# Patient Record
Sex: Male | Born: 1965 | Race: White | Hispanic: No | Smoking: Current every day smoker
Health system: Southern US, Community
[De-identification: ages and names within clinical notes are randomized; demographics above are authoritative.]

## PROBLEM LIST (undated history)

## (undated) DIAGNOSIS — F319 Bipolar disorder, unspecified: Secondary | ICD-10-CM

## (undated) DIAGNOSIS — T7840XA Allergy, unspecified, initial encounter: Secondary | ICD-10-CM

## (undated) DIAGNOSIS — F419 Anxiety disorder, unspecified: Secondary | ICD-10-CM

## (undated) DIAGNOSIS — F329 Major depressive disorder, single episode, unspecified: Secondary | ICD-10-CM

## (undated) DIAGNOSIS — J45909 Unspecified asthma, uncomplicated: Secondary | ICD-10-CM

## (undated) DIAGNOSIS — F32A Depression, unspecified: Secondary | ICD-10-CM

## (undated) DIAGNOSIS — K219 Gastro-esophageal reflux disease without esophagitis: Secondary | ICD-10-CM

## (undated) HISTORY — PX: SPINE SURGERY: SHX786

## (undated) HISTORY — DX: Anxiety disorder, unspecified: F41.9

## (undated) HISTORY — DX: Gastro-esophageal reflux disease without esophagitis: K21.9

## (undated) HISTORY — DX: Major depressive disorder, single episode, unspecified: F32.9

## (undated) HISTORY — PX: GALLBLADDER SURGERY: SHX652

## (undated) HISTORY — DX: Allergy, unspecified, initial encounter: T78.40XA

## (undated) HISTORY — PX: CHOLECYSTECTOMY: SHX55

## (undated) HISTORY — DX: Depression, unspecified: F32.A

## (undated) HISTORY — PX: FRACTURE SURGERY: SHX138

## (undated) HISTORY — DX: Bipolar disorder, unspecified: F31.9

## (undated) HISTORY — DX: Unspecified asthma, uncomplicated: J45.909

---

## 1995-06-17 HISTORY — PX: CYST REMOVAL HAND: SHX6279

## 1999-03-14 ENCOUNTER — Emergency Department (HOSPITAL_COMMUNITY): Admission: EM | Admit: 1999-03-14 | Discharge: 1999-03-15 | Payer: Self-pay | Admitting: Emergency Medicine

## 1999-03-14 ENCOUNTER — Encounter: Payer: Self-pay | Admitting: Emergency Medicine

## 2001-04-20 ENCOUNTER — Ambulatory Visit (HOSPITAL_COMMUNITY): Admission: RE | Admit: 2001-04-20 | Discharge: 2001-04-20 | Payer: Self-pay | Admitting: Orthopedic Surgery

## 2001-04-20 ENCOUNTER — Encounter: Payer: Self-pay | Admitting: Orthopedic Surgery

## 2001-05-03 ENCOUNTER — Encounter: Payer: Self-pay | Admitting: Neurosurgery

## 2001-05-03 ENCOUNTER — Ambulatory Visit (HOSPITAL_COMMUNITY): Admission: RE | Admit: 2001-05-03 | Discharge: 2001-05-03 | Payer: Self-pay | Admitting: Neurosurgery

## 2001-05-11 ENCOUNTER — Ambulatory Visit: Admission: RE | Admit: 2001-05-11 | Discharge: 2001-05-11 | Payer: Self-pay | Admitting: Neurosurgery

## 2001-06-10 ENCOUNTER — Observation Stay (HOSPITAL_COMMUNITY): Admission: RE | Admit: 2001-06-10 | Discharge: 2001-06-10 | Payer: Self-pay | Admitting: Neurosurgery

## 2001-06-10 ENCOUNTER — Encounter: Payer: Self-pay | Admitting: Neurosurgery

## 2006-03-26 ENCOUNTER — Ambulatory Visit (HOSPITAL_BASED_OUTPATIENT_CLINIC_OR_DEPARTMENT_OTHER): Admission: RE | Admit: 2006-03-26 | Discharge: 2006-03-26 | Payer: Self-pay | Admitting: Family Medicine

## 2006-03-26 ENCOUNTER — Encounter: Payer: Self-pay | Admitting: Pulmonary Disease

## 2006-03-29 ENCOUNTER — Ambulatory Visit: Payer: Self-pay | Admitting: Internal Medicine

## 2006-05-20 ENCOUNTER — Ambulatory Visit: Payer: Self-pay | Admitting: Pulmonary Disease

## 2007-01-02 ENCOUNTER — Emergency Department (HOSPITAL_COMMUNITY): Admission: EM | Admit: 2007-01-02 | Discharge: 2007-01-03 | Payer: Self-pay | Admitting: Emergency Medicine

## 2007-01-13 ENCOUNTER — Ambulatory Visit: Payer: Self-pay | Admitting: Pulmonary Disease

## 2007-01-28 ENCOUNTER — Ambulatory Visit: Payer: Self-pay | Admitting: Pulmonary Disease

## 2007-04-16 ENCOUNTER — Ambulatory Visit: Payer: Self-pay | Admitting: Pulmonary Disease

## 2007-04-16 DIAGNOSIS — G4733 Obstructive sleep apnea (adult) (pediatric): Secondary | ICD-10-CM | POA: Insufficient documentation

## 2007-04-16 DIAGNOSIS — R519 Headache, unspecified: Secondary | ICD-10-CM | POA: Insufficient documentation

## 2007-04-16 DIAGNOSIS — J431 Panlobular emphysema: Secondary | ICD-10-CM | POA: Insufficient documentation

## 2007-04-16 DIAGNOSIS — E785 Hyperlipidemia, unspecified: Secondary | ICD-10-CM | POA: Insufficient documentation

## 2007-04-16 DIAGNOSIS — Z9989 Dependence on other enabling machines and devices: Secondary | ICD-10-CM

## 2007-04-16 DIAGNOSIS — J301 Allergic rhinitis due to pollen: Secondary | ICD-10-CM | POA: Insufficient documentation

## 2007-04-16 DIAGNOSIS — J438 Other emphysema: Secondary | ICD-10-CM | POA: Insufficient documentation

## 2007-04-16 DIAGNOSIS — R51 Headache: Secondary | ICD-10-CM | POA: Insufficient documentation

## 2007-04-16 DIAGNOSIS — J4520 Mild intermittent asthma, uncomplicated: Secondary | ICD-10-CM | POA: Insufficient documentation

## 2007-09-23 ENCOUNTER — Encounter (INDEPENDENT_AMBULATORY_CARE_PROVIDER_SITE_OTHER): Payer: Self-pay | Admitting: *Deleted

## 2007-10-08 ENCOUNTER — Ambulatory Visit: Payer: Self-pay | Admitting: Sports Medicine

## 2007-10-08 DIAGNOSIS — M19079 Primary osteoarthritis, unspecified ankle and foot: Secondary | ICD-10-CM | POA: Insufficient documentation

## 2007-10-08 DIAGNOSIS — M2170 Unequal limb length (acquired), unspecified site: Secondary | ICD-10-CM | POA: Insufficient documentation

## 2007-10-08 DIAGNOSIS — M79609 Pain in unspecified limb: Secondary | ICD-10-CM | POA: Insufficient documentation

## 2007-10-14 ENCOUNTER — Ambulatory Visit: Payer: Self-pay | Admitting: Pulmonary Disease

## 2007-10-19 ENCOUNTER — Ambulatory Visit: Payer: Self-pay

## 2007-10-25 LAB — HM COLONOSCOPY

## 2008-03-29 ENCOUNTER — Emergency Department (HOSPITAL_COMMUNITY): Admission: EM | Admit: 2008-03-29 | Discharge: 2008-03-29 | Payer: Self-pay | Admitting: Emergency Medicine

## 2010-07-16 NOTE — Assessment & Plan Note (Signed)
Summary: f/u osa   Chief Complaint:  Follow up.  Pt states he currently has a full face mask- would like to try nasal pillows.  pt states he is trying to use cpap every night- does pull mask off during the night unknowingly.  History of Present Illness:  the patient comes in today for follow-up of his obstructive sleep apnea.  He has been wearing the device every night, but occasionally will awaken and the mask is lying in the floor.  He has been having trouble with air gulping with the full face mask, and would like to try nasal pillows.  This may help with his compliance as well.  The patient on his own has changed his CPAP machine from a set pressure to automatic mode.  This may also be contributing to his awakenings and pulling the mask off during the night.  The patient feels that he is breathing fairly well from a pulmonary standpoint, although he has not stopped smoking.     Current Allergies: CLEOCIN ZITHROMAX  Past Medical History:    Current Problems:     OSTEOARTHRITIS, FOOT, RIGHT (ICD-715.97)    FOOT PAIN, RIGHT (ICD-729.5)    UNEQUAL LEG LENGTH (ICD-736.81)    EMPHYSEMA (ICD-492.8)    SLEEP APNEA (ICD-780.57)    HEADACHE, CHRONIC (ICD-784.0)    DYSLIPIDEMIA (ICD-272.4)    ALLERGIC RHINITIS (ICD-477.9)    ASTHMA (ICD-493.90)         Risk Factors:     Vital Signs:  Patient Profile:   45 Years Old Male Weight:      218.13 pounds O2 Sat:      96 % O2 treatment:    Room Air Temp:     97.8 degrees F oral Pulse rate:   78 / minute BP sitting:   106 / 78  (left arm) Cuff size:   regular  Vitals Entered By: Cyndia Diver LPN (October 14, 2007 9:14 AM)             Comments Medications reviewed with patient  Cyndia Diver LPN  October 14, 2007 9:15 AM      Physical Exam  General:     well-developed male in no acute distress Nose:     no skin breakdown or pressure necrosis from the CPAP mask     Impression & Recommendations:  Problem # 1:  SLEEP APNEA  (ICD-780.57) overall, the patient for the most part is doing fairly well with CPAP.  I would like to try him on a nasal pillow device and see if he has better tolerance.  I am willing to leave him on the auto device for now, but will need to change back to traditional cpap if he continues to have problems.  Medications Added to Medication List This Visit: 1)  Celexa 40 Mg Tabs (Citalopram hydrobromide) .... Take 2 tabs by mouth daily 2)  Advair Diskus 500-50 Mcg/dose Misc (Fluticasone-salmeterol) .... Inhale 1 puff two times a day 3)  Ritalin 20 Mg Tabs (Methylphenidate hcl) .... Take 1 tablet by mouth three times a day   Patient Instructions: 1)  will refer you again to smoking cessation program 2)  will try nasal pillows with auto.  let me know if you are not able to keep mask on all night. 3)  f/u 6mos    ]
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
## 2010-07-16 NOTE — Letter (Signed)
Summary: *Consult Note  [facility]  [address]   [place], [place] [address]   Phone: [phone]  Fax: [phone]    Dr. Elvina Sidle [facility] Dr. Ginette [place], [place]   Re:    Jeff Norton DOB:    [birth year]   Dear Kenyon Ana:   Thank you for requesting that we see the above patient for consultation.  A copy of the detailed office note will be sent under separate cover, for your review.  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 Permanent orthotics if this helps.   see my note.   Thank you for this consultation.  If you have any further questions regarding the care of this patient, please do not hesitate to contact me @ [phone]  Thank you for this opportunity to look after your patient.  Sincerely,  Army Melia Hatice Bubel MD  Appended Document: *Consult Note faxed to [phone]

## 2010-10-29 NOTE — Assessment & Plan Note (Signed)
[facility]                             PULMONARY OFFICE NOTE   NAME:Jeff Norton, Jeff Norton                      MRN:          [id number]  DATE:01/28/2007                            DOB:          24-Mar-1966    This is a self-referral.   HISTORY OF PRESENT ILLNESS:  The patient is a 45 year old gentleman who  I follow for obstructive sleep apnea who now comes in today for  evaluation of dyspnea. The patient states that he has gotten to the  point where he has dyspnea on exertion at about six blocks at a moderate  pace. He has no difficulty bringing groceries in from the car, making a  bed or vacuuming his house. He rides bicycles moderate distances without  too much trouble, but clearly feels that his breathing is not normal.  The patient feels that his breathing does interfere with his heavier  exertional activities. The patient has a dry hacking cough with  occasional white mucus production. He has been on Advair on-and-off for  the last 5 years and feels like it does help when he stays on it  consistently. When he is not using Advair he needs to use albuterol  fairly frequently. The patient has a long history of smoking and  continues to do so. The patient also has a lot of allergy symptoms to  various exposures.   PAST MEDICAL HISTORY:  1. Asthma as a child.  2. History of dyslipidemia.  3. History of allergic rhinitis.  4. History of chronic headaches.  5. History of sleep apnea.  6. Status post cholecystectomy and spine surgery.   CURRENT MEDICATIONS:  1. Flonase one spray in each nares daily p.r.n.  2. Lexapro 40 mg daily.  3. Lipitor 20 mg daily.  4. Albuterol two sprays q 4-6 hours p.r.n.  5. Also many over-the-counter vitamins and other herbal preparations.   ALLERGIES:  CLEOCIN.   SOCIAL HISTORY:  The patient is divorced and has children. He works as a  Haematologist. He has a history of smoking 3/4 pack per day for up  to 28  years. He still currently smokes a pack a day.   FAMILY HISTORY:  Remarkable for father having asthma. Otherwise,  noncontributory in first degree relatives.   REVIEW OF SYSTEMS:  As per History of Present Illness. Also see patient  intake form, documented in the chart.   PHYSICAL EXAMINATION:  In general, he is an overweight male in no acute  distress. Blood pressure 112/80, pulse 90, temperature 97.7, weight 228  pounds. O2 saturation on room air is 96%.  HEENT: Pupils equal, round and reactive to light and accommodation.  Extraocular muscles are intact. Nares: Are narrowed, but patent.  Oropharynx does show elongation of the soft palate and uvula.  NECK: Is supple without JVD or lymphadenopathy. There is no palpable  thyromegaly.  CHEST: Is totally clear to auscultation.  CARDIAC: Reveals regular rate and rhythm. No murmurs, rubs or gallops.  ABDOMEN: Soft and nontender with good bowel sounds.  GENITAL, RECTAL, BREASTS: Was not done and not indicated.  LOWER EXTREMITIES: Were without edema. Pulses are intact distally.  NEUROLOGIC: Alert and oriented and moves all four extremities without  difficulty.   LABORATORY DATA:  Spirometry today in the office reveals mild  obstructive disease.   IMPRESSION:  Emphysema with a possible component of asthma and I suspect  asthmatic bronchitis from ongoing smoking. The most important treatment  at this point is smoking cessation and I also think he needs to get a  bronchodilator regimen because of his symptomatology. I would like to  get him on medication and get him to quit smoking and have him come back  in three months and do spirometry and see how much reversibility is  here. It is really unclear how much of this is emphysema and how much is  reversible airflow obstruction.   PLAN:  1. Will check chest x-ray today.  2. Stay on Advair 250/50 one b.i.d.  3. Stop smoking.  4. The patient currently needs a better fitting continuous  positive      airway pressure (CPAP) mask and we need to get an auto report from      his auto titrate device once he gets a better fitting mask.  5. The patient will followup in three months or sooner if there are      problems.     Barbaraann Share, MD,FCCP  Electronically Signed    [facility]/MedQ  DD: 02/01/2007  DT: 02/01/2007  Job #: [id number]   cc:   Marjory Lies, M.D.

## 2010-11-01 NOTE — Assessment & Plan Note (Signed)
[facility]                             PULMONARY OFFICE NOTE   NAME:Jeff Norton, Jeff Norton                      MRN:          [id number]  DATE:05/20/2006                            DOB:          1965/09/07    SLEEP MEDICINE CONSULTATION.   HISTORY OF PRESENT ILLNESS:  The patient is a 45 year old gentleman who  I have been asked to see for obstructive sleep apnea.  The patient has  undergone nocturnal polysomnogram on October, 2007, where by split night  protocol he was found to have an RDI of 58 events per hour and O2  desaturation as low as 78%.  The patient was placed on CPAP and  ultimately titrated to 11 cmH2O pressure.  The patient states that he  has been told he has loud snoring as well as pauses in his breathing  during sleep.  He also admits to choking arousals.  He typically gets to  bed at 11 and gets up at 7 a.m.  He feels that he is not rested upon  arising.  Patient works as a Warden/ranger and definitely notes  sleep pressure during the day with periods of inactivity.  He also notes  decreased efficiency and concentration at work.  He typically gets his  second wind in the evening.  He has intermittent sleep pressure while  driving but no frank sleepiness.  Of note, the patient's weight is up  about 30 pounds over the last two years.   PAST MEDICAL HISTORY:  Significant for:  1. Asthma.  2. History of chronic headaches.  3. History of cholecystectomy.  4. History of spine surgery in 2004.  5. History of multiple other orthopedic procedures.   CURRENT MEDICATIONS:  1. Advair 500/50 one b.i.d.  2. Lexapro 20 mg daily.  3. Albuterol two sprays q.4-6 hours p.r.n.  4. Flonase nasal spray one in each nares daily.  5. Also, various over-the-counter supplements.   PATIENT HAS INTOLERANCE/ALLERGY TO CLEOCIN AND ZITHROMAX.   SOCIAL HISTORY:  He is divorced and has children.  He has a history of  smoking up to 3/4 pack of cigarettes  per day for 28 years.  He continues  to smoke.   FAMILY HISTORY:  Remarkable for his father having asthma, otherwise is  noncontributory.   REVIEW OF SYSTEMS:  As per History of Present Illness.  Also see the  Patient Intake Form documented in the chart.   PHYSICAL EXAM:  GENERAL:  He is an obese male in no acute distress.  Blood pressure is 110/78, pulse 80, temperature is 97.8, weight is 221  pounds, 5 feet 10 inches tall.  The O2 saturation on room air is 96%.  HEENT:  Pupils equal, round, reactive to light and accommodation.  Extraocular muscles intact.  Nares are somewhat narrowed, right worse  than the left.  Oropharynx shows mild elongation of the soft palate and  uvula.  NECK:  Supple without JVD or lymphadenopathy, there is no palpable  thyromegaly.  CHEST:  Totally clear.  CARDIAC:  Reveals regular rate and rhythm, no murmurs, rubs or gallops.  ABDOMEN:  Soft, nontender with good bowel sounds.  GENITAL, RECTAL, BREAST:  Not done and not indicated.  LOWER EXTREMITIES:  Without edema, good pulses distally, no calf  tenderness.  NEUROLOGIC:  Alert and oriented with no obvious observable motor  defects.   IMPRESSION:  Severe obstructive sleep apnea documented by nocturnal  polysomnography.  The patient clearly is very symptomatic, is obese, and  has some degree of abnormal upper airway anatomy.  At this point in  time, I really think continuous positive airway pressure coupled with  weight loss would be in his best interest.  I had a long discussion with  him about the pathophysiology of sleep apnea including the short term  quality of life issues and the long term cardiovascular issues.  He  agrees to give continuous positive airway pressure a try.   PLAN:  1. Initiate CPAP at 10 cm.  2. Work on weight loss.  3. The patient will follow up in 4 weeks, or sooner if there are      problems.     Barbaraann Share, MD,FCCP  Electronically Signed    [facility]/MedQ  DD:  07/02/2006  DT: 07/02/2006  Job #: [id number]   cc:   Marjory Lies, M.D.

## 2010-11-01 NOTE — Procedures (Signed)
NAME:  Jeff Norton, Jeff Norton               ACCOUNT NO.:  [id number]   MEDICAL RECORD NO.:  [id number]          PATIENT TYPE:  OUT   LOCATION:  SLEEP CENTER                 FACILITY:  [facility]   PHYSICIAN:  Clinton D. Maple Hudson, MD, FCCP, FACPDATE OF BIRTH:  06/09/1965   DATE OF STUDY:                              NOCTURNAL POLYSOMNOGRAM   REFERRING PHYSICIAN:  Dr. Marjory Lies   INDICATION FOR STUDY:  Hypersomnia with sleep apnea.   EPWORTH SLEEPINESS SCORE:  8/24, BMI 31, weight 210 pounds.   HOME MEDICATION:  Advair, albuterol, Lexapro, Wellbutrin.   SLEEP ARCHITECTURE:  Total sleep time 359 minutes with sleep efficiency 90%.  Stage 1 was 4%, stage 2 74%, stages 3 and 4 15%, REM 7% of total sleep time.  Sleep latency 15 minutes, REM latency 248 minutes, awake after sleep onset  29 minutes, arousal index 20.4.  No bedtime medication taken.   RESPIRATORY DATA:  Split study protocol.  Apnea/hypopnea index (AHI, RDI)  58.2 obstructive events per hour indicating severe obstructive sleep  apnea/hypopnea syndrome before CPAP.  This included 16 obstructive apneas  and 131 hypopneas before CPAP.  Most sleep and most events were while  supine.  REM AHI zero.  CPAP was titrated to 11 CWP, AHI 0.5 per hour.  A  medium ResMed Ultra Mirage mask was used with heated humidifier.   OXYGEN DATA:  Moderate snoring and mouth breathing with oxygen desaturation  to a nadir of 78%.  After CPAP control, saturation held 95% on room air.   CARDIAC DATA:  Normal sinus rhythm.   MOVEMENT/PARASOMNIA:  Occasional limb jerk with little effect on sleep.   IMPRESSION/RECOMMENDATION:  1. Severe obstructive sleep apnea/hypopnea syndrome, apnea/hypopnea index      58.2 per hour with most sleep and events while supine.  Moderate      snoring and mouth breathing with oxygen desaturation to a nadir of 78%.  2. Successful CPAP titration to 11 CWP, apnea/hypopnea index 0.5 per hour.      A medium ResMed Ultra Mirage mask was  used with heated humidifier.      Clinton D. Maple Hudson, MD, [facility], FACP  Diplomate, Biomedical engineer of Sleep Medicine  Electronically Signed     CDY/MEDQ  D:  03/29/2006 12:33:21  T:  03/30/2006 13:47:51  Job:  [id number]

## 2010-11-01 NOTE — Op Note (Signed)
[facility]  Patient:    MATTIE, NOVOSEL Visit Number: [id number] MRN: [id number]          Service Type: OBV Location: 3000 [id number] Attending Physician:  Barton Fanny Dictated by:   Hewitt Shorts, M.D. Proc. Date: 06/10/01 Admit Date:  06/10/2001                             Operative Report  PREOPERATIVE DIAGNOSIS:  C6-7 cervical disk herniation.  POSTOPERATIVE DIAGNOSIS:  C6-7 cervical disk herniation.  OPERATION PERFORMED:  C6-7 anterior cervical diskectomy and arthrodesis with iliac crest allograft and anterior cervical plating.  SURGEON:  Hewitt Shorts, M.D.  ASSISTANT:  Tanya Nones. Jeral Fruit, M.D.  ANESTHESIA:  General endotracheal.  INDICATIONS FOR PROCEDURE:  The patient is a 45 year old man who presented with a left cervical radiculopathy ____________ to a left C6-7 cervical disk herniation superimposed upon underlying degenerative disk disease and spondylosis.  A decision was made to proceed with a single level anterior cervical diskectomy and arthrodesis.  DESCRIPTION OF PROCEDURE:  The patient was brought to the operating room and placed under general endotracheal anesthesia.  The patient was placed in 10 pounds of halter traction and the neck was prepped with Betadine soap and solution and draped in sterile fashion.  A horizontal incision was made in the left side of the neck.  The line of incision was infiltrated with local anesthetic with epinephrine.  Incision was made with a Shaw scalp at a temperature of 120.  Dissection was carried down to the subcutaneous tissues and platysma.  Dissection was then carried out to an avascular plane leaving the sternocleidomastoid, carotid artery and jugular vein laterally and trachea and esophagus medially.  The ventral aspect of the vertebral column was identified and a localizing x-ray was taken and the C6-7 intervertebral disk space identified.  Diskectomy was begun  with incision of the annulus and continued with microcurets and pituitary rongeurs.  The cartilaginous end plates of the corresponding vertebrae were removed using microcurets as well as the Micromax drill.  The microscope was draped and brought into the field to provide additional magnification, illumination and visualization.  The remainder of the procedure was performed using microdissection and microsurgical technique.  Posterior osteophytic overgrowth was removed using Micromax drill and a 2 mm Kerrison punch with a thin foot plate.  There was marked thickening of the posterior longitudinal ligament which was removed and then foraminotomies were performed bilaterally.  As we were performing the foraminotomy on the left side, we encountered the disk herniation which was removed and the thecal sac and nerve roots were decompressed bilaterally on each side.  The wound was irrigated with bacitracin solution and checked for hemostasis which was established with the use of Gelfoam soaked in thrombin. Then we selected a wedge of iliac crest allograft.  It was cut and shaped to size and positioned in the intervertebral disk space and countersunk.  We then selected a 14 mm Reflex anterior cervical plate.  It was positioned over the fusion construct and secured to each of the vertebrae with a pair of 4.0 x 16 mm screws.  Each of the screw holes was drilled and then a self tapping screw placed.  All four screws were fully secured and tightened.  An x-ray was taken which showed the screws at C6 in good position.  C7 could not be well visualized.  The overall alignment was  good.  The wound was irrigated with bacitracin solution and checked for hemostasis which was established and confirmed and then we proceeded with closure.  The platysma was closed with interrupted inverted 2-0 undyed Vicryl sutures.  The subcutaneous and subcuticular layer were closed with interrupted inverted 3-0 undyed  Vicryl sutures and the skin was reapproximated with Dermabond.  The patient tolerated the procedure well.  Estimated blood loss for this procedure was 50 cc.  The sponge and needle counts were correct.  Following surgery, the patient was placed in a soft cervical collar, reversed from anesthetic, extubated and transferred to the recovery room for further care where he was noted to be moving all four extremities. Dictated by:   Hewitt Shorts, M.D. Attending Physician:  Barton Fanny DD:  06/10/01 TD:  06/10/01 Job: [id number]

## 2011-03-28 ENCOUNTER — Inpatient Hospital Stay (HOSPITAL_COMMUNITY)
Admission: EM | Admit: 2011-03-28 | Discharge: 2011-03-30 | DRG: 087 | Discharge status: Against Medical Advice | Payer: BC Managed Care – PPO | Attending: Internal Medicine | Admitting: Internal Medicine

## 2011-03-28 ENCOUNTER — Emergency Department (HOSPITAL_COMMUNITY): Payer: BC Managed Care – PPO

## 2011-03-28 DIAGNOSIS — G4733 Obstructive sleep apnea (adult) (pediatric): Secondary | ICD-10-CM | POA: Diagnosis present

## 2011-03-28 DIAGNOSIS — E872 Acidosis, unspecified: Secondary | ICD-10-CM | POA: Diagnosis present

## 2011-03-28 DIAGNOSIS — J9600 Acute respiratory failure, unspecified whether with hypoxia or hypercapnia: Secondary | ICD-10-CM

## 2011-03-28 DIAGNOSIS — J45909 Unspecified asthma, uncomplicated: Secondary | ICD-10-CM | POA: Diagnosis present

## 2011-03-28 DIAGNOSIS — F17200 Nicotine dependence, unspecified, uncomplicated: Secondary | ICD-10-CM | POA: Diagnosis present

## 2011-03-28 DIAGNOSIS — R4182 Altered mental status, unspecified: Secondary | ICD-10-CM | POA: Diagnosis present

## 2011-03-28 DIAGNOSIS — F1010 Alcohol abuse, uncomplicated: Secondary | ICD-10-CM | POA: Diagnosis present

## 2011-03-28 DIAGNOSIS — E785 Hyperlipidemia, unspecified: Secondary | ICD-10-CM | POA: Diagnosis present

## 2011-03-28 LAB — BASIC METABOLIC PANEL
BUN: 11 mg/dL (ref 6–23)
CO2: 24 mEq/L (ref 19–32)
Calcium: 9.2 mg/dL (ref 8.4–10.5)
Chloride: 101 mEq/L (ref 96–112)
Creatinine, Ser: 0.74 mg/dL (ref 0.50–1.35)
GFR calc Af Amer: >90 mL/min (ref >90)
GFR calc non Af Amer: >90 mL/min (ref >90)
Glucose, Bld: 95 mg/dL (ref 70–99)
Potassium: 4.8 mEq/L (ref 3.5–5.1)
Sodium: 139 mEq/L (ref 135–145)

## 2011-03-28 LAB — HEPATIC FUNCTION PANEL
ALT: 39 U/L (ref 0–53)
AST: 41 U/L — ABNORMAL HIGH (ref 0–37)
Albumin: 4 g/dL (ref 3.5–5.2)
Alkaline Phosphatase: 75 U/L (ref 39–117)
Bilirubin, Direct: 0.1 mg/dL (ref 0.0–0.3)
Indirect Bilirubin: 0.3 mg/dL (ref 0.3–0.9)
Total Bilirubin: 0.4 mg/dL (ref 0.3–1.2)
Total Protein: 7.7 g/dL (ref 6.0–8.3)

## 2011-03-28 LAB — CBC
HCT: 42.8 % (ref 39.0–52.0)
Hemoglobin: 15.2 g/dL (ref 13.0–17.0)
MCH: 28.8 pg (ref 26.0–34.0)
MCHC: 35.5 g/dL (ref 30.0–36.0)
MCV: 81.2 fL (ref 78.0–100.0)
Platelets: 182 10*3/uL (ref 150–400)
RBC: 5.27 MIL/uL (ref 4.22–5.81)
RDW: 13.7 % (ref 11.5–15.5)
WBC: 7.1 10*3/uL (ref 4.0–10.5)

## 2011-03-28 LAB — AMMONIA: Ammonia: 33 umol/L (ref 11–60)

## 2011-03-28 LAB — BLOOD GAS, ARTERIAL
Acid-base deficit: 1.9 mmol/L (ref 0.0–2.0)
Bicarbonate: 26.2 mEq/L — ABNORMAL HIGH (ref 20.0–24.0)
Drawn by: 336861
FIO2: 1 %
O2 Saturation: 99.4 %
Patient temperature: 98.6
TCO2: 23.8 mmol/L (ref 0–100)
pCO2 arterial: 61.5 mmHg (ref 35.0–45.0)
pH, Arterial: 7.253 — ABNORMAL LOW (ref 7.350–7.450)
pO2, Arterial: 229 mmHg — ABNORMAL HIGH (ref 80.0–100.0)

## 2011-03-28 LAB — RAPID URINE DRUG SCREEN, HOSP PERFORMED
Amphetamines: NOT DETECTED
Barbiturates: NOT DETECTED
Benzodiazepines: NOT DETECTED
Cocaine: NOT DETECTED
Opiates: NOT DETECTED
Tetrahydrocannabinol: NOT DETECTED

## 2011-03-28 LAB — ETHANOL: Alcohol, Ethyl (B): 101 mg/dL — ABNORMAL HIGH (ref 0–11)

## 2011-03-28 LAB — CK: Total CK: 153 U/L (ref 7–232)

## 2011-03-28 LAB — GLUCOSE, CAPILLARY: Glucose-Capillary: 107 mg/dL — ABNORMAL HIGH (ref 70–99)

## 2011-03-29 ENCOUNTER — Emergency Department (HOSPITAL_COMMUNITY): Payer: BC Managed Care – PPO

## 2011-03-29 DIAGNOSIS — F10229 Alcohol dependence with intoxication, unspecified: Secondary | ICD-10-CM

## 2011-03-29 DIAGNOSIS — R0902 Hypoxemia: Secondary | ICD-10-CM

## 2011-03-29 DIAGNOSIS — J9600 Acute respiratory failure, unspecified whether with hypoxia or hypercapnia: Secondary | ICD-10-CM

## 2011-03-29 LAB — COMPREHENSIVE METABOLIC PANEL
ALT: 31 U/L (ref 0–53)
AST: 22 U/L (ref 0–37)
Albumin: 3.5 g/dL (ref 3.5–5.2)
Alkaline Phosphatase: 64 U/L (ref 39–117)
BUN: 10 mg/dL (ref 6–23)
CO2: 27 mEq/L (ref 19–32)
Calcium: 8.7 mg/dL (ref 8.4–10.5)
Chloride: 102 mEq/L (ref 96–112)
Creatinine, Ser: 0.56 mg/dL (ref 0.50–1.35)
GFR calc Af Amer: >90 mL/min (ref >90)
GFR calc non Af Amer: >90 mL/min (ref >90)
Glucose, Bld: 104 mg/dL — ABNORMAL HIGH (ref 70–99)
Potassium: 4.2 mEq/L (ref 3.5–5.1)
Sodium: 138 mEq/L (ref 135–145)
Total Bilirubin: 0.5 mg/dL (ref 0.3–1.2)
Total Protein: 6.9 g/dL (ref 6.0–8.3)

## 2011-03-29 LAB — CK TOTAL AND CKMB (NOT AT ARMC)
CK, MB: 2.1 ng/mL (ref 0.3–4.0)
Relative Index: INVALID (ref 0.0–2.5)
Total CK: 90 U/L (ref 7–232)

## 2011-03-29 LAB — BLOOD GAS, ARTERIAL
Acid-Base Excess: 0.2 mmol/L (ref 0.0–2.0)
Acid-Base Excess: 1.3 mmol/L (ref 0.0–2.0)
Bicarbonate: 26.7 mEq/L — ABNORMAL HIGH (ref 20.0–24.0)
Bicarbonate: 27.8 mEq/L — ABNORMAL HIGH (ref 20.0–24.0)
Delivery systems: POSITIVE
Delivery systems: POSITIVE
Drawn by: 336861
Drawn by: 336861
Expiratory PAP: 7
Expiratory PAP: 7
FIO2: 0.4 %
FIO2: 0.3 %
Inspiratory PAP: 14
Inspiratory PAP: 14
Mode: POSITIVE
Mode: POSITIVE
O2 Saturation: 99.1 %
O2 Saturation: 97.6 %
Patient temperature: 98.6
Patient temperature: 98.6
TCO2: 23.7 mmol/L (ref 0–100)
TCO2: 24.8 mmol/L (ref 0–100)
pCO2 arterial: 52.8 mmHg — ABNORMAL HIGH (ref 35.0–45.0)
pCO2 arterial: 54.3 mmHg — ABNORMAL HIGH (ref 35.0–45.0)
pH, Arterial: 7.324 — ABNORMAL LOW (ref 7.350–7.450)
pH, Arterial: 7.33 — ABNORMAL LOW (ref 7.350–7.450)
pO2, Arterial: 148 mmHg — ABNORMAL HIGH (ref 80.0–100.0)
pO2, Arterial: 91.8 mmHg (ref 80.0–100.0)

## 2011-03-29 LAB — CARDIAC PANEL(CRET KIN+CKTOT+MB+TROPI)
CK, MB: 1.7 ng/mL (ref 0.3–4.0)
CK, MB: 1.9 ng/mL (ref 0.3–4.0)
Relative Index: INVALID (ref 0.0–2.5)
Relative Index: INVALID (ref 0.0–2.5)
Total CK: 81 U/L (ref 7–232)
Total CK: 84 U/L (ref 7–232)
Troponin I: <0.3 ng/mL (ref <0.30)
Troponin I: <0.3 ng/mL (ref <0.30)

## 2011-03-29 LAB — MRSA PCR SCREENING: MRSA by PCR: NEGATIVE

## 2011-03-29 LAB — PRO B NATRIURETIC PEPTIDE: Pro B Natriuretic peptide (BNP): 22.2 pg/mL (ref 0–125)

## 2011-03-29 LAB — TROPONIN I: Troponin I: <0.3 ng/mL (ref <0.30)

## 2011-03-29 LAB — LIPASE, BLOOD: Lipase: 24 U/L (ref 11–59)

## 2011-03-30 ENCOUNTER — Inpatient Hospital Stay (HOSPITAL_COMMUNITY): Payer: BC Managed Care – PPO

## 2011-03-30 LAB — BLOOD GAS, ARTERIAL
Acid-Base Excess: 3.6 mmol/L — ABNORMAL HIGH (ref 0.0–2.0)
Bicarbonate: 28.4 mEq/L — ABNORMAL HIGH (ref 20.0–24.0)
Delivery systems: POSITIVE
Drawn by: 313061
Mode: POSITIVE
O2 Content: 2 L/min
O2 Saturation: 96.8 %
Patient temperature: 98.6
TCO2: 25.1 mmol/L (ref 0–100)
pCO2 arterial: 46 mmHg — ABNORMAL HIGH (ref 35.0–45.0)
pH, Arterial: 7.408 (ref 7.350–7.450)
pO2, Arterial: 73.7 mmHg — ABNORMAL LOW (ref 80.0–100.0)

## 2011-03-30 LAB — CBC
HCT: 38.8 % — ABNORMAL LOW (ref 39.0–52.0)
Hemoglobin: 13.7 g/dL (ref 13.0–17.0)
MCH: 28.6 pg (ref 26.0–34.0)
MCHC: 35.3 g/dL (ref 30.0–36.0)
MCV: 81 fL (ref 78.0–100.0)
Platelets: 146 10*3/uL — ABNORMAL LOW (ref 150–400)
RBC: 4.79 MIL/uL (ref 4.22–5.81)
RDW: 14.1 % (ref 11.5–15.5)
WBC: 7.1 10*3/uL (ref 4.0–10.5)

## 2011-03-30 LAB — COMPREHENSIVE METABOLIC PANEL
ALT: 25 U/L (ref 0–53)
AST: 18 U/L (ref 0–37)
Albumin: 3.2 g/dL — ABNORMAL LOW (ref 3.5–5.2)
Alkaline Phosphatase: 62 U/L (ref 39–117)
BUN: 10 mg/dL (ref 6–23)
CO2: 30 mEq/L (ref 19–32)
Calcium: 8.7 mg/dL (ref 8.4–10.5)
Chloride: 102 mEq/L (ref 96–112)
Creatinine, Ser: 0.65 mg/dL (ref 0.50–1.35)
GFR calc Af Amer: >90 mL/min (ref >90)
GFR calc non Af Amer: >90 mL/min (ref >90)
Glucose, Bld: 88 mg/dL (ref 70–99)
Potassium: 3.7 mEq/L (ref 3.5–5.1)
Sodium: 136 mEq/L (ref 135–145)
Total Bilirubin: 0.8 mg/dL (ref 0.3–1.2)
Total Protein: 6 g/dL (ref 6.0–8.3)

## 2011-03-30 NOTE — H&P (Signed)
[name], Norton NO.:  [id number]  MEDICAL RECORD NO.:  [id number]  LOCATION:  WLED                         FACILITY:  [facility]  PHYSICIAN:  Tarry Kos, MD       DATE OF BIRTH:  [birth year]  DATE OF ADMISSION:  03/28/2011 DATE OF DISCHARGE:                             HISTORY & PHYSICAL   CHIEF COMPLAINT:  Found down by stranger.  HISTORY OF PRESENT ILLNESS:  Jeff Norton is a 45 year old male, who was found down in a Parking Lot earlier this evening by a bystander and was brought into the emergency department, unresponsive.  He had been put on BiPAP initially because he was hypercapnic and hypoxic.  We have no past medical history on this patient.  He has not been hospitalized here ever.  He did have a sleep study back in 2007, which showed severe obstructive sleep apnea, and his alcohol is positive.  According to the emergency room staff since he has been in the ED on BiPAP, he is becoming more responsive and is improving, but; however, he still awfully altered with his mental status.  Critical Care Medicine was called to admit the patient in the ICU, they did not feel that he was an ICU candidate due to the fact that his ABG is improving on BiPAP.  I cannot obtain any history for Mr. Dimmick.  His father was here earlier, the nurse said that his father did not know how much he drinks alcohol and did not think that he had an alcohol problem and he did not know his past medical history.  REVIEW OF SYSTEMS:  Otherwise, unobtainable.  PAST MEDICAL HISTORY:  Unobtainable except for a sleep study in 2007, which again shows severe obstructive sleep apnea.  Also per old records, asthma as a child dyslipidemia, chronic headache, status post coli, status post spine surgery.  HOME MEDICATIONS:  Unknown.  ALLERGIES:  None listed.  PHYSICAL EXAMINATION:  VITAL SIGNS:  Temperature 98.6, blood pressure 126/63, pulse 106, respiration 18.  He has 100% on BiPAP  right now. GENERAL:  He response to voice and the sternal rub.  He can move all extremities, barely follows simple commands. HEENT:  Extraocular muscles intact.  Pupils equal and reactive to light. Oropharynx is clear.  Mucous membranes are moist. NECK:  No JVD.  No carotid bruits. COR:  Regular rate and rhythm.  No murmurs, rubs, or gallops. CHEST:  Clear to auscultation bilaterally.  No wheezes, rhonchi, or rales. ABDOMEN:  Soft, nontender, nondistended.  Positive bowel sounds.  No hepatosplenomegaly. EXTREMITIES:  No clubbing, cyanosis, or edema. PSYCH:  Very sedated.  No agitation. NEURO:  No focal neurologic deficits that I can appreciate.  For initial ABG was pH is 7.253 with a PCO2 of 61.5, a PO2 of 229.  He had a repeat ABG done several hours later, pH is improved to 7.324 with a PCO2 of 52.8 and a PO2 of 148.  His white count is normal.  His hemoglobin is normal.  His urine drug screen is negative.  His ammonia level is normal.  His initial CK is normal.  There is no troponin done. BMP is normal.  Alcohol  level is elevated at 101.  LFTs are essentially normal.  Chest x-ray shows no evidence of pneumonia or heart failure.  CT of his C-spine and head shows no acute issues.  A 12-lead EKG is not on the chart, but was reported as negative.  ASSESSMENT AND PLAN:  This is a 45 year old male, found down with acute hypercapnic respiratory failure. 1. Acute hypercapnic respiratory failure of unclear etiology.  I am     going to add on a troponin as there has been no troponin done and     obviously serial his cardiac enzymes.  Continue BiPAP.  He seems to     be improving with BiPAP.  He does have alcohol on board,  however,     it is not that impressive.  His urine drug screen is negative.  He     does have severe obstructive sleep apnea; however, it does not make     sense why he was found down in Parking Lot.  According to his     vitals, he must have not being down a long as  his temperature was     normal, I would think he would have gotten much colder outside     lying in the Parking Lot for an extended period of time and his     total CK is normal.  His chest x-ray is negative.  I am going to     add on a lipase level.  We will obtain neurological checks q.4     hours.  I am going to obtain a BNP level, provide him with some IV     fluids, and see how he does over the next couple of hours with     BiPAP.  Keep his O2 sats above 90%.  I am going to put him in the     ICU as there is no step-down beds and see how he does and we will     try to obtain more history as he wakes up.  The patient is full     code.  Further recommendation is pending over hospital course.          ______________________________ Tarry Kos, MD     RD/MEDQ  D:  03/29/2011  T:  03/29/2011  Job:  [id number]  Electronically Signed by Tarry Kos MD on 03/30/2011 07:46:52 PM

## 2011-03-30 NOTE — H&P (Signed)
  NAME:  Jeff Norton, Jeff Norton NO.:  [id number]  MEDICAL RECORD NO.:  [id number]  LOCATION:  WLED                         FACILITY:  [facility]  PHYSICIAN:  Tarry Kos, MD       DATE OF BIRTH:  01-04-1966  DATE OF ADMISSION:  03/28/2011 DATE OF DISCHARGE:                             HISTORY & PHYSICAL   ADDENDUM:  Apparently, there has been no 12-lead EKG done for this patient in the ED yet and there has been no cardiac enzymes done.  The nurse is getting a 12-lead EKG as soon as possible and we will check STAT cardiac enzymes to make sure this man is not having an acute MI.          ______________________________ Tarry Kos, MD     RD/MEDQ  D:  03/29/2011  T:  03/29/2011  Job:  [id number]  Electronically Signed by Tarry Kos MD on 03/30/2011 07:46:09 PM

## 2011-04-11 NOTE — Consult Note (Signed)
NAME:  Jeff Norton, Jeff Norton NO.:  [id number]  MEDICAL RECORD NO.:  [id number]  LOCATION:  WLED                         FACILITY:  [facility]  PHYSICIAN:  Deon Ivey, MD      DATE OF BIRTH:  08/20/1965  DATE OF CONSULTATION:  03/29/2011 DATE OF DISCHARGE:                                CONSULTATION   CONSULTING SERVICE:  [facility].  REFERRING PHYSICIAN:  Dr. Lynelle Doctor, Emergency Medicine.  REASON FOR CONSULT AND EVALUATION:  Altered mental status, hypercarbic respiratory failure.  HISTORY OF PRESENT ILLNESS:  Jeff Norton is a 45 year old man, who was brought to the emergency department by a bysitter after he was [place] in the park.  He was noted to be difficult to arouse and confused, brought to the emergency department.  Patient was not able to provide significant history about the events prior to the arrival.  Emergency department staff noted that he was clearly obtunded and breathing shallowly and slowly.  Evaluation in the emergency department was notable for an alcohol level of 101 as well his blood gas that showed respiratory acidosis of the patient at 7.253 and PCO2 of 61.5.  He was placed on BiPAP.  Over the past several hours he is continued to remain lethargic and obtunded.  However, has some slowly improving mental status that he is unable to arouse to vigorous stimuli and he answers some simple questions.  He is also noted to have improvement in his hypercarbic respiratory failure.  He has not felt to have any respiratory distress during his time in the emergency department.  Per emergency department staff, his father was present earlier in the evening and reported that he does drink heavily, but unsure of quantity and is not aware of any other drug abuse.  PAST MEDICAL HISTORY: 1. Asthma with tobacco abuse. 2. History of heavy drinking. 3. History of sleep apnea and uses CPAP at home. 4. Possible  diabetes.  MEDICATIONS:  Unknown.  ALLERGIES:  Per previous records, ZITHROMAX, which causes rash and CLEOCIN, which causes nausea and vomiting.  FAMILY HISTORY:  Unable to obtain due to patient's mental status.  SOCIAL HISTORY:  Per records, he is a current smoker and heavy drinker with no other known drug abuse.  Additional history limited by patient's mental status.  REVIEW OF SYSTEMS:  Unable to obtain due to patient's mental status.  PHYSICAL EXAMINATION:  VITAL SIGNS:  Temperature 98.6 Fahrenheit, blood pressure 126/63, pulse 106, respiratory rate 18, oxygen saturation 100%. GENERAL:  Lying in bed with BiPAP in place and fairly lethargic. HEENT:  BiPAP mask in place.  Pupils are reactive.  Extraocular movements are intact.  Sclerae are clear without any icterus. NECK:  Supple.  No cervical or supraclavicular lymphadenopathy.  No thyromegaly. LUNGS:  Good air movement, but occasional respiratory wheeze.  No rales. HEART:  Borderline tachycardic.  No murmurs or rubs.  Good distal pulses. ABDOMEN:  Soft, nontender, nondistended.  No obvious hepatosplenomegaly. EXTREMITIES:  No edema. NEUROLOGIC:  Moves all 4 extremities to noxious stimuli.  Fairly obtunded, but awake and still vigorous.  Noxious stimuli to his fingers. Able to answer his name and location, but  falls asleep fairly quickly afterwards. SKIN:  No rashes.  DIAGNOSTIC TESTS:  ABG:  PH 7.25, PCO2 61.5, PO2 229 on 100% FiO2. Following several hours of BiPAP, pH 7.32, PCO2 of 52, PO2 of 148. Chemistry:  Sodium 139, potassium 4.8, chloride 101, CO2 24, glucose 95, BUN 11, creatinine 0.74, calcium 9.2, CK 153.  AST 41, ALT 39.  T-bili 0.4, ammonia 33, alcohol 101.  Urine toxicology screen negative.  CBC: WBC 7.1, hemoglobin 15.2, hematocrit 42.8, platelets 182.  Chest x-ray, portable:  Lungs are clear without any consolidation, effusion, or other lung abnormality.  CT head:  No evidence of acute infarction.  CT  spine:  Within normal limits.  ASSESSMENT AND PLAN:  Jeff Norton is a 45 year old male with a history of alcohol abuse and asthma versus chronic obstructive pulmonary disease, who comes in with altered mental status with hypercarbic respiratory failure. 1. Altered mental status.  He does have somewhat elevated alcohol     level and he also has some substance ingestion that is not     detected.  His basic radiographic imaging shows an unremarkable     head CT.  Per ED staff, he seems to be having some improvement in     his mental status with supportive care including BiPAP for his     respiratory acidosis.  I would recommend continuing to monitor for     improvement and if he has not started to clear by tomorrow, time     for any adjust substances, these have to wear off to consider     further workup. 2. Hypercarbic respiratory failure.  He does have a history of asthma     and/or chronic obstructive pulmonary disease.  He has not been     reported to have any respiratory distress and he has clear air     movements on exam.  I guess based on his history that he is     hyperventilating due to the cause of his altered mental status.  He     has improvement in his blood gas with BiPAP.  We will recommend     continuing on the BiPAP through the morning and repeating the blood     gas at that time.  If he remains normal or near to normal, it is     reasonable to trial off the BiPAP. I discussed the patient with Emergency Medicine physician in the hospital on-call.  He seems to be having a trend of some improvement with his mental status and his respiratory failure while on BiPAP and with supportive care.  Based on this, we agreed that it is reasonable to be admitted to Step-Down level of care.          ______________________________ Ollen Bowl, MD     DR/MEDQ  D:  03/29/2011  T:  03/29/2011  Job:  [id number]  Electronically Signed by Ollen Bowl MD on 04/11/2011 08:07:42 PM

## 2012-02-24 ENCOUNTER — Ambulatory Visit (INDEPENDENT_AMBULATORY_CARE_PROVIDER_SITE_OTHER): Payer: Managed Care, Other (non HMO) | Admitting: Family Medicine

## 2012-02-24 VITALS — BP 110/80 | HR 79 | Temp 98.9°F | Resp 18 | Ht 69.0 in | Wt 218.0 lb

## 2012-02-24 DIAGNOSIS — Z720 Tobacco use: Secondary | ICD-10-CM

## 2012-02-24 DIAGNOSIS — J45901 Unspecified asthma with (acute) exacerbation: Secondary | ICD-10-CM

## 2012-02-24 DIAGNOSIS — J189 Pneumonia, unspecified organism: Secondary | ICD-10-CM

## 2012-02-24 DIAGNOSIS — E669 Obesity, unspecified: Secondary | ICD-10-CM

## 2012-02-24 DIAGNOSIS — F17200 Nicotine dependence, unspecified, uncomplicated: Secondary | ICD-10-CM

## 2012-02-24 DIAGNOSIS — J410 Simple chronic bronchitis: Secondary | ICD-10-CM | POA: Insufficient documentation

## 2012-02-24 MED ORDER — ALBUTEROL SULFATE HFA 108 (90 BASE) MCG/ACT IN AERS: 2.0000 | INHALATION_SPRAY | RESPIRATORY_TRACT | Status: DC | PRN

## 2012-02-24 MED ORDER — GUAIFENESIN-CODEINE 100-10 MG/5ML PO SYRP: 5.0000 mL | ORAL_SOLUTION | Freq: Three times a day (TID) | ORAL | Status: AC | PRN

## 2012-02-24 MED ORDER — CEPHALEXIN 500 MG PO CAPS: 500.0000 mg | ORAL_CAPSULE | Freq: Four times a day (QID) | ORAL | Status: AC

## 2012-02-24 MED ORDER — METHYLPREDNISOLONE 4 MG PO KIT: PACK | ORAL | Status: AC

## 2012-02-24 MED ORDER — FLUTICASONE-SALMETEROL 100-50 MCG/DOSE IN AEPB: 1.0000 | INHALATION_SPRAY | Freq: Two times a day (BID) | RESPIRATORY_TRACT | Status: DC

## 2012-02-24 NOTE — Progress Notes (Signed)
  Subjective:    Patient ID: Jeff Norton, male    DOB: 06-05-1966, 46 y.o.   MRN: [id number]  URI  Associated symptoms include congestion, coughing, sneezing and wheezing. Pertinent negatives include no chest pain, diarrhea, nausea or neck pain.  4d prev pt was a work - swept the floor then accidentally broke the vacuum bag and inhaled a lot of dust, dirt. Dev itchy throat the next day and has progressively worsened w/ trouble breathing, prod cough of green mucous, nasal cong, low-grade fever. Diagnosed w/ asthma but wasn't able to afford advair and alb so not on - now he has ins so would be willing to restart. Smoking 3/4 ppd - 15 cig H/o bipolar and adhd.    Review of Systems  Constitutional: Positive for fever, chills, activity change and fatigue.  HENT: Positive for congestion, sneezing and postnasal drip. Negative for neck pain, neck stiffness and ear discharge.   Eyes: Negative for discharge and visual disturbance.  Respiratory: Positive for cough, chest tightness, shortness of breath and wheezing.   Cardiovascular: Negative for chest pain and leg swelling.  Gastrointestinal: Negative for nausea, diarrhea and constipation.  Musculoskeletal: Positive for back pain, arthralgias and gait problem.  Psychiatric/Behavioral: Positive for behavioral problems.       Objective:   Physical Exam  Vitals reviewed. Constitutional: He is oriented to person, place, and time. He appears well-developed and well-nourished. No distress.  HENT:  Head: Normocephalic and atraumatic.  Right Ear: External ear and ear canal normal. Tympanic membrane is retracted. A middle ear effusion is present.  Left Ear: External ear and ear canal normal. Tympanic membrane is retracted. A middle ear effusion is present.  Nose: Mucosal edema and septal deviation present.  Mouth/Throat: Oropharynx is clear and moist.  Eyes: Conjunctivae are normal. No scleral icterus.  Neck: Normal range of motion. Neck supple. No  tracheal deviation present. No thyromegaly present.  Cardiovascular: Normal rate, regular rhythm, normal heart sounds and intact distal pulses.   Pulmonary/Chest: Effort normal. No respiratory distress. He has wheezes. He has rales. He exhibits no tenderness.  Abdominal: Bowel sounds are normal.  Lymphadenopathy:    He has no cervical adenopathy.  Neurological: He is alert and oriented to person, place, and time.  Skin: Skin is warm and dry. He is not diaphoretic.  Psychiatric: He has a normal mood and affect.          Assessment & Plan:  1. Asthma exac - due to poor lung exam, I will go ahead and cover for ca-pna as well w/ keflex in addition to oral steroids. Restart advair and alb inhaler (if copay to much, cons change to qvar or other alb formulation). If not feeling sig better in 3d, rtc for CXR. 2. Tob cessation - pt will try on own.

## 2012-02-25 NOTE — Progress Notes (Signed)
Reviewed and agree.

## 2012-06-01 ENCOUNTER — Ambulatory Visit: Payer: BC Managed Care – PPO | Admitting: Family Medicine

## 2012-06-01 VITALS — BP 108/66 | HR 85 | Temp 98.6°F | Resp 18 | Ht 68.0 in | Wt 217.4 lb

## 2012-06-01 DIAGNOSIS — R05 Cough: Secondary | ICD-10-CM

## 2012-06-01 DIAGNOSIS — J45909 Unspecified asthma, uncomplicated: Secondary | ICD-10-CM

## 2012-06-01 DIAGNOSIS — R059 Cough, unspecified: Secondary | ICD-10-CM

## 2012-06-01 DIAGNOSIS — R509 Fever, unspecified: Secondary | ICD-10-CM

## 2012-06-01 LAB — POCT INFLUENZA A/B
Influenza A, POC: POSITIVE
Influenza B, POC: NEGATIVE

## 2012-06-01 MED ORDER — ALBUTEROL SULFATE HFA 108 (90 BASE) MCG/ACT IN AERS: 2.0000 | INHALATION_SPRAY | RESPIRATORY_TRACT | Status: DC | PRN

## 2012-06-01 MED ORDER — HYDROCODONE-HOMATROPINE 5-1.5 MG/5ML PO SYRP: 5.0000 mL | ORAL_SOLUTION | Freq: Three times a day (TID) | ORAL | Status: DC | PRN

## 2012-06-01 MED ORDER — FLUTICASONE-SALMETEROL 100-50 MCG/DOSE IN AEPB: 1.0000 | INHALATION_SPRAY | Freq: Two times a day (BID) | RESPIRATORY_TRACT | Status: DC

## 2012-06-01 NOTE — Patient Instructions (Addendum)
Rest, drink plenty of fluids- use the cough syrup as needed.  If you are not better in the next couple of days please let me know.  The cough syrup can make you sleepy- don't use it if you need to drive

## 2012-06-01 NOTE — Progress Notes (Signed)
[facility] [address] [place] [address] [phone]  Date:  06/01/2012   Name:  Jeff Norton   DOB:  [birth year]   MRN:  [id number]  PCP:  No primary provider on file.    Chief Complaint: Nasal Congestion, Cough, Wheezing and Fever   History of Present Illness:  Jeff Norton is a 46 y.o. very pleasant male patient who presents with the following:  Here today with illness. He has been ill since Friday (now Tuesday)- started with a mild cough.  He was worse the next day- he had a fever of 102.1, Sunday 101.5 even with medication.  He has had a productive cough which has become more dry.  He has been using OTC cough medication.   He had a temp this am of 99.5.  He does not notice body aches or chills.   He does have a mild ST, no earache.   He did have a flu shot this year He has been pushing fluids.   No GI symptoms.    He notes that he has always been a heavy sweater- he tends to sweat always if it is over 70 degrees or so.  He is allergic to dry- sol and wonders if there is other treatment for him.    He is stable on treatment for BPD.  He takes his meds faithfully   Patient Active Problem List  Diagnosis  . DYSLIPIDEMIA  . ALLERGIC RHINITIS  . EMPHYSEMA  . ASTHMA  . OSTEOARTHRITIS, FOOT, RIGHT  . FOOT PAIN, RIGHT  . UNEQUAL LEG LENGTH  . SLEEP APNEA  . HEADACHE, CHRONIC  . Obesity (BMI 30.0-34.9)  . Tobacco abuse    Past Medical History  Diagnosis Date  . Allergy   . Asthma   . Bipolar 1 disorder     Past Surgical History  Procedure Date  . Gallbladder surgery   . Fracture surgery   . Cyst removal hand 1997    History  Substance Use Topics  . Smoking status: Current Every Day Smoker  . Smokeless tobacco: Not on file  . Alcohol Use: Not on file    Family History  Problem Relation Age of Onset  . Hypertension Mother   . Diabetes Father   . Depression Sister     Allergies  Allergen Reactions  . Azithromycin   .  Cleocin (Clindamycin Hcl)     Medication list has been reviewed and updated.  Current Outpatient Prescriptions on File Prior to Visit  Medication Sig Dispense Refill  . carbamazepine (ANTIPSYCHOTIC - EQUETRO) 200 MG CP12 Take 400 mg by mouth 2 (two) times daily.      Marland Kitchen lamoTRIgine (LAMICTAL) 25 MG tablet Take 50 mg by mouth daily.      Marland Kitchen albuterol (PROVENTIL HFA;VENTOLIN HFA) 108 (90 BASE) MCG/ACT inhaler Inhale 2 puffs into the lungs every 4 (four) hours as needed for wheezing (cough, shortness of breath or wheezing.).  1 Inhaler  1  . albuterol (PROVENTIL) (2.5 MG/3ML) 0.083% nebulizer solution Take 2.5 mg by nebulization every 6 (six) hours as needed.      . Fluticasone-Salmeterol (ADVAIR) 100-50 MCG/DOSE AEPB Inhale 1 puff into the lungs every 12 (twelve) hours.  60 each  3    Review of Systems:  As per HPI- otherwise negative.   Physical Examination: Filed Vitals:   06/01/12 1449  BP: 108/66  Pulse: 85  Temp: 98.6 F (37 C)  Resp: 18   Filed  Vitals:   06/01/12 1449  Height: 5\' 8"  (1.727 m)  Weight: 217 lb 6.4 oz (98.612 kg)   Body mass index is 33.06 kg/(m^2). Ideal Body Weight: Weight in (lb) to have BMI = 25: 164.1   GEN: WDWN, NAD, Non-toxic, A & O x 3, overweight HEENT: Atraumatic, Normocephalic. Neck supple. No masses, No LAD.  Bilateral TM wnl, oropharynx normal.  PEERL,EOMI.   Ears and Nose: No external deformity. CV: RRR, No M/G/R. No JVD. No thrill. No extra heart sounds. PULM: CTA B, no wheezes, crackles, rhonchi. No retractions. No resp. distress. No accessory muscle use. ABD: S, NT, ND EXTR: No c/c/e NEURO Normal gait.  PSYCH: Normally interactive. Conversant. Not depressed or anxious appearing.  Calm demeanor.   Results for orders placed in visit on 06/01/12  POCT INFLUENZA A/B      Component Value Range   Influenza A, POC Positive     Influenza B, POC Negative      Assessment and Plan: 1. Fever  POCT Influenza A/B  2. Cough   HYDROcodone-homatropine (HYCODAN) 5-1.5 MG/5ML syrup   Influenza- after flu- shot.  Too late for tamiflu today- will treat with hycodan cough syrup as needed.  He will rest and drink plenty of fluids, stay home from work until AF for 24 hours.  Warned him about sedation from the cough syrup especially in conjunction with his other medications.  Refilled his albuterol and advair today as well - he dos suffer from ashtma but his symptoms are not escalated by this illness.    Abbe Amsterdam, MD

## 2012-06-10 ENCOUNTER — Telehealth: Payer: Self-pay

## 2012-06-10 ENCOUNTER — Other Ambulatory Visit: Payer: Self-pay | Admitting: Family Medicine

## 2012-06-10 MED ORDER — BENZONATATE 100 MG PO CAPS: 100.0000 mg | ORAL_CAPSULE | Freq: Three times a day (TID) | ORAL | Status: DC | PRN

## 2012-06-10 NOTE — Telephone Encounter (Signed)
Spoke with patient he has tired OTC delsym and mucinix without relief.  Advised patient to return to clinic and why he needs to return.  Patient states he can not afford to come back and pay another co-pay.

## 2012-06-10 NOTE — Telephone Encounter (Signed)
We can send something else for cough.  If he is continuing to require narcotic cough medicine, he needs to RTC so we can see why he is still coughing.

## 2012-06-10 NOTE — Telephone Encounter (Signed)
9 days after office visit, please advise if okay to renew Hycodan or if patient needs to return to clinic.

## 2012-06-10 NOTE — Telephone Encounter (Signed)
PT STATES HE IS STILL COUGHING AND WOULD LIKE A REFILL ON THE HYDROCODONE AND HE CAN COME BY AND P/U THE PRESCRIPTION. PLEASE CALL [phone]

## 2012-06-10 NOTE — Telephone Encounter (Signed)
I sent him a prescription cough tablet.  If he is still requiring a narcotic cough medicine this long into illness, something more serious could be going on.  The meds I sent to pharmacy will work much better than delsym.

## 2012-06-11 NOTE — Telephone Encounter (Signed)
Thanks, I have called patient to advise.  

## 2012-10-01 ENCOUNTER — Ambulatory Visit (INDEPENDENT_AMBULATORY_CARE_PROVIDER_SITE_OTHER): Payer: 59 | Admitting: Family Medicine

## 2012-10-01 VITALS — BP 138/82 | HR 78 | Temp 97.8°F | Resp 18 | Ht 68.0 in | Wt 226.0 lb

## 2012-10-01 DIAGNOSIS — J45909 Unspecified asthma, uncomplicated: Secondary | ICD-10-CM

## 2012-10-01 DIAGNOSIS — H00013 Hordeolum externum right eye, unspecified eyelid: Secondary | ICD-10-CM

## 2012-10-01 DIAGNOSIS — H00019 Hordeolum externum unspecified eye, unspecified eyelid: Secondary | ICD-10-CM

## 2012-10-01 DIAGNOSIS — J309 Allergic rhinitis, unspecified: Secondary | ICD-10-CM

## 2012-10-01 MED ORDER — ALBUTEROL SULFATE HFA 108 (90 BASE) MCG/ACT IN AERS: 2.0000 | INHALATION_SPRAY | RESPIRATORY_TRACT | Status: DC | PRN

## 2012-10-01 MED ORDER — FLUTICASONE PROPIONATE 50 MCG/ACT NA SUSP: 2.0000 | Freq: Every day | NASAL | Status: DC

## 2012-10-01 MED ORDER — OFLOXACIN 0.3 % OP SOLN: 2.0000 [drp] | Freq: Four times a day (QID) | OPHTHALMIC | Status: DC

## 2012-10-01 MED ORDER — FLUTICASONE-SALMETEROL 100-50 MCG/DOSE IN AEPB: 1.0000 | INHALATION_SPRAY | Freq: Two times a day (BID) | RESPIRATORY_TRACT | Status: DC

## 2012-10-01 NOTE — Patient Instructions (Addendum)
Sty  A sty (hordeolum) is an infection of a gland in the eyelid located at the base of the eyelash. A sty may develop a white or yellow head of pus. It can be puffy (swollen). Usually, the sty will burst and pus will come out on its own. They do not leave lumps in the eyelid once they drain.  A sty is often confused with another form of cyst of the eyelid called a chalazion. Chalazions occur within the eyelid and not on the edge where the bases of the eyelashes are. They often are red, sore and then form firm lumps in the eyelid.  CAUSES    Germs (bacteria).   Lasting (chronic) eyelid inflammation.  SYMPTOMS    Tenderness, redness and swelling along the edge of the eyelid at the base of the eyelashes.   Sometimes, there is a white or yellow head of pus. It may or may not drain.  DIAGNOSIS   An ophthalmologist will be able to distinguish between a sty and a chalazion and treat the condition appropriately.   TREATMENT    Styes are typically treated with warm packs (compresses) until drainage occurs.   In rare cases, medicines that kill germs (antibiotics) may be prescribed. These antibiotics may be in the form of drops, cream or pills.   If a hard lump has formed, it is generally necessary to do a small incision and remove the hardened contents of the cyst in a minor surgical procedure done in the office.   In suspicious cases, your caregiver may send the contents of the cyst to the lab to be certain that it is not a rare, but dangerous form of cancer of the glands of the eyelid.  HOME CARE INSTRUCTIONS    Wash your hands often and dry them with a clean towel. Avoid touching your eyelid. This may spread the infection to other parts of the eye.   Apply heat to your eyelid for 10 to 20 minutes, several times a day, to ease pain and help to heal it faster.   Do not squeeze the sty. Allow it to drain on its own. Wash your eyelid carefully 3 to 4 times per day to remove any pus.  SEEK IMMEDIATE MEDICAL CARE IF:     Your eye becomes painful or puffy (swollen).   Your vision changes.   Your sty does not drain by itself within 3 days.   Your sty comes back within a short period of time, even with treatment.   You have redness (inflammation) around the eye.   You have a fever.  Document Released: 03/12/2005 Document Revised: 08/25/2011 Document Reviewed: 11/14/2008  [employer] Patient Information 2013 [employer]

## 2012-10-01 NOTE — Progress Notes (Signed)
[facility]:  Office Visit  Chief Complaint:  Chief Complaint  Patient presents with  . Eye Pain    right eyelid painful swelling/ red  . Medication Refill    wants refill on Albuterol Inhaler    HPI: Jeff Norton is a 47 y.o. male who complains of  Right eyelid swelling on Wednesday woke up with it. Tried Sudafed and bendryl and zyrtec, neosporin on outside of eye, warm compresses, woke up this Am eye with tender and red Lid and more sewlling so decided to get evaluated. No fevers or chills. No vision problems. Has allergies, asthma. + itchy eyes. No prior eye surgery. No cataracts. No glaucoma.   Past Medical History  Diagnosis Date  . Allergy   . Asthma   . Bipolar 1 disorder    Past Surgical History  Procedure Laterality Date  . Gallbladder surgery    . Fracture surgery    . Cyst removal hand  1997   History   Social History  . Marital Status: Divorced    Spouse Name: N/A    Number of Children: N/A  . Years of Education: N/A   Social History Main Topics  . Smoking status: Current Every Day Smoker  . Smokeless tobacco: None  . Alcohol Use: None  . Drug Use: None  . Sexually Active: None   Other Topics Concern  . None   Social History Narrative  . None   Family History  Problem Relation Age of Onset  . Hypertension Mother   . Diabetes Father   . Depression Sister    Allergies  Allergen Reactions  . Azithromycin   . Cleocin (Clindamycin Hcl)    Prior to Admission medications   Medication Sig Start Date End Date Taking? Authorizing Provider  albuterol (PROVENTIL HFA;VENTOLIN HFA) 108 (90 BASE) MCG/ACT inhaler Inhale 2 puffs into the lungs every 4 (four) hours as needed for wheezing (cough, shortness of breath or wheezing.). 06/01/12 06/01/13 Yes Jessica C Copland, MD  carbamazepine (ANTIPSYCHOTIC - EQUETRO) 200 MG CP12 Take 400 mg by mouth daily.    Yes Historical Provider, MD  lamoTRIgine (LAMICTAL) 25 MG tablet Take 200 mg by  mouth daily.    Yes Historical Provider, MD  albuterol (PROVENTIL) (2.5 MG/3ML) 0.083% nebulizer solution Take 2.5 mg by nebulization every 6 (six) hours as needed.    Historical Provider, MD  Fluticasone-Salmeterol (ADVAIR) 100-50 MCG/DOSE AEPB Inhale 1 puff into the lungs every 12 (twelve) hours. 06/01/12   Gwenlyn Found Copland, MD  HYDROcodone-homatropine (HYCODAN) 5-1.5 MG/5ML syrup Take 5 mLs by mouth every 8 (eight) hours as needed for cough. 06/01/12   Gwenlyn Found Copland, MD     ROS: The patient denies fevers, chills, night sweats, unintentional weight loss, chest pain, palpitations, wheezing, dyspnea on exertion, nausea, vomiting, abdominal pain, dysuria, hematuria, melena, numbness, weakness, or tingling.  All other systems have been reviewed and were otherwise negative with the exception of those mentioned in the HPI and as above.    PHYSICAL EXAM: Filed Vitals:   10/01/12 1537  BP: 138/82  Pulse: 78  Temp: 97.8 F (36.6 C)  Resp: 18   Filed Vitals:   10/01/12 1537  Height: 5\' 8"  (1.727 m)  Weight: 226 lb (102.513 kg)   Body mass index is 34.37 kg/(m^2).  General: Alert, no acute distress HEENT:  Normocephalic, atraumatic, oropharynx patent. EOMI, PERRLA, fundoscopic exam nl. + right upper eyelid sty , + erythema , clear to white dc  Cardiovascular:  Regular rate and rhythm, no rubs murmurs or gallops.  No Carotid bruits, radial pulse intact. No pedal edema.  Respiratory: Clear to auscultation bilaterally.  No wheezes, rales, or rhonchi.  No cyanosis, no use of accessory musculature GI: No organomegaly, abdomen is soft and non-tender, positive bowel sounds.  No masses. Skin: No rashes. Neurologic: Facial musculature symmetric. Psychiatric: Patient is appropriate throughout our interaction. Lymphatic: No cervical lymphadenopathy Musculoskeletal: Gait intact.   LABS: Results for orders placed in visit on 06/01/12  POCT INFLUENZA A/B      Result Value Range   Influenza A,  POC Positive     Influenza B, POC Negative       EKG/XRAY:   Primary read interpreted by Dr. Conley Rolls at [facility].   ASSESSMENT/PLAN: Encounter Diagnoses  Name Primary?  Louann Sjogren, right Yes  . Allergic rhinitis   . Asthma   . Asthma, chronic    No corneal abrasions on fluoroscein exam + sty on right upper lid, warm compresses, wash hands. Rx ocuflox Rx Flonase Refill albuterol and advair, patient is somker , does not want to try chantix F/u prn    Tere Mcconaughey PHUONG, DO 10/01/2012 5:39 PM

## 2012-10-02 ENCOUNTER — Telehealth: Payer: Self-pay | Admitting: Family Medicine

## 2012-10-02 NOTE — Telephone Encounter (Signed)
LM to see how his eyes are doing.

## 2013-07-13 DIAGNOSIS — Z0271 Encounter for disability determination: Secondary | ICD-10-CM

## 2014-04-10 ENCOUNTER — Emergency Department (HOSPITAL_COMMUNITY)
Admission: EM | Admit: 2014-04-10 | Discharge: 2014-04-10 | Disposition: A | Discharge status: Home / Self Care | Payer: BC Managed Care – PPO | Attending: Emergency Medicine | Admitting: Emergency Medicine

## 2014-04-10 ENCOUNTER — Emergency Department (HOSPITAL_COMMUNITY): Payer: BC Managed Care – PPO

## 2014-04-10 ENCOUNTER — Encounter (HOSPITAL_COMMUNITY): Payer: Self-pay | Admitting: Emergency Medicine

## 2014-04-10 DIAGNOSIS — J189 Pneumonia, unspecified organism: Secondary | ICD-10-CM

## 2014-04-10 DIAGNOSIS — F319 Bipolar disorder, unspecified: Secondary | ICD-10-CM | POA: Insufficient documentation

## 2014-04-10 DIAGNOSIS — Z7951 Long term (current) use of inhaled steroids: Secondary | ICD-10-CM | POA: Insufficient documentation

## 2014-04-10 DIAGNOSIS — J45909 Unspecified asthma, uncomplicated: Secondary | ICD-10-CM | POA: Insufficient documentation

## 2014-04-10 DIAGNOSIS — J159 Unspecified bacterial pneumonia: Secondary | ICD-10-CM | POA: Insufficient documentation

## 2014-04-10 DIAGNOSIS — Z720 Tobacco use: Secondary | ICD-10-CM | POA: Insufficient documentation

## 2014-04-10 DIAGNOSIS — Z79899 Other long term (current) drug therapy: Secondary | ICD-10-CM | POA: Insufficient documentation

## 2014-04-10 MED ORDER — DOXYCYCLINE HYCLATE 100 MG PO CAPS: 100.0000 mg | ORAL_CAPSULE | Freq: Two times a day (BID) | ORAL | Status: DC

## 2014-04-10 MED ORDER — GUAIFENESIN ER 1200 MG PO TB12: 1.0000 | ORAL_TABLET | Freq: Two times a day (BID) | ORAL | Status: DC

## 2014-04-10 MED ORDER — ALBUTEROL SULFATE HFA 108 (90 BASE) MCG/ACT IN AERS: 2.0000 | INHALATION_SPRAY | RESPIRATORY_TRACT | Status: DC | PRN

## 2014-04-10 MED ORDER — PROMETHAZINE-DM 6.25-15 MG/5ML PO SYRP: 5.0000 mL | ORAL_SOLUTION | Freq: Four times a day (QID) | ORAL | Status: DC | PRN

## 2014-04-10 MED ORDER — IPRATROPIUM BROMIDE 0.02 % IN SOLN
0.5000 mg | Freq: Once | RESPIRATORY_TRACT | Status: AC
  Administered 2014-04-10: 0.5 mg via RESPIRATORY_TRACT
  Filled 2014-04-10: qty 2.5

## 2014-04-10 MED ORDER — ALBUTEROL SULFATE (2.5 MG/3ML) 0.083% IN NEBU
5.0000 mg | INHALATION_SOLUTION | Freq: Once | RESPIRATORY_TRACT | Status: AC
  Administered 2014-04-10: 5 mg via RESPIRATORY_TRACT
  Filled 2014-04-10: qty 6

## 2014-04-10 MED ORDER — DOXYCYCLINE HYCLATE 100 MG PO TABS
100.0000 mg | ORAL_TABLET | Freq: Once | ORAL | Status: AC
Start: 2014-04-10 — End: 2014-04-10
  Administered 2014-04-10: 100 mg via ORAL
  Filled 2014-04-10: qty 1

## 2014-04-10 NOTE — Discharge Instructions (Signed)
Follow up with the urgent care provided.  Return here as needed for any worsening in her condition.  Increase your fluid intake

## 2014-04-10 NOTE — ED Provider Notes (Signed)
Medical screening examination/treatment/procedure(s) were performed by non-physician practitioner and as supervising physician I was immediately available for consultation/collaboration.  Richarda Blade, MD 04/10/14 [phone]

## 2014-04-10 NOTE — ED Notes (Signed)
Pt reports productive cough for 8 days with green phlem. Cough is "painful" to upper chest. Pt in NAD and denies SOB.

## 2014-04-10 NOTE — ED Provider Notes (Signed)
CSN: [id number]     Arrival date & time 04/10/14  1247 History  This chart was scribed for a non-physician practitioner, Dalia Heading, PA-C, working with Richarda Blade, MD by Cathie Hoops, ED Scribe. The patient was seen in WTR9/WTR9. The patient's care was started at 1:45 PM.   Chief Complaint  Patient presents with  . Cough  . Fever    The history is provided by the patient. No language interpreter was used.   HPI Comments: Jeff Norton is a 48 y.o. male who presents to the Emergency Department complaining of severe, gradually worsening cough onset 8 days ago. Pt notes his cough is productive with green phlegm. Pt has associated pain in his upper chest and fever. He notes last Tuesday he had a fever and attempted to take Tylenol with no relief. Pt states it feels like he is having a heart attack when he coughs. Pt smokes about 0.5 pack/day. Pt denies any other symptoms at this time.   Past Medical History  Diagnosis Date  . Allergy   . Asthma   . Bipolar 1 disorder    Past Surgical History  Procedure Laterality Date  . Gallbladder surgery    . Fracture surgery    . Cyst removal hand  1997   Family History  Problem Relation Age of Onset  . Hypertension Mother   . Diabetes Father   . Depression Sister    History  Substance Use Topics  . Smoking status: Current Every Day Smoker  . Smokeless tobacco: Not on file  . Alcohol Use: No    Review of Systems  Constitutional: Positive for fever. Negative for chills.  Respiratory: Positive for cough and chest tightness. Negative for shortness of breath.   Gastrointestinal: Negative for nausea and vomiting.  Neurological: Negative for weakness.  All other systems reviewed and are negative.     Allergies  Azithromycin and Cleocin  Home Medications   Prior to Admission medications   Medication Sig Start Date End Date Taking? Authorizing Provider  albuterol (PROVENTIL HFA;VENTOLIN HFA) 108 (90 BASE) MCG/ACT  inhaler Inhale 2 puffs into the lungs every 4 (four) hours as needed for wheezing (cough, shortness of breath or wheezing.). 10/01/12 10/01/13  Thao P Le, DO  albuterol (PROVENTIL) (2.5 MG/3ML) 0.083% nebulizer solution Take 2.5 mg by nebulization every 6 (six) hours as needed.    Historical Provider, MD  carbamazepine (ANTIPSYCHOTIC - EQUETRO) 200 MG CP12 Take 400 mg by mouth daily.     Historical Provider, MD  fluticasone (FLONASE) 50 MCG/ACT nasal spray Place 2 sprays into the nose daily. 10/01/12   Thao P Le, DO  Fluticasone-Salmeterol (ADVAIR) 100-50 MCG/DOSE AEPB Inhale 1 puff into the lungs every 12 (twelve) hours. 10/01/12   Thao P Le, DO  HYDROcodone-homatropine (HYCODAN) 5-1.5 MG/5ML syrup Take 5 mLs by mouth every 8 (eight) hours as needed for cough. 06/01/12   Darreld Mclean, MD  lamoTRIgine (LAMICTAL) 25 MG tablet Take 200 mg by mouth daily.     Historical Provider, MD  ofloxacin (OCUFLOX) 0.3 % ophthalmic solution Place 2 drops into the right eye 4 (four) times daily. For 5 days 10/01/12   Thao P Le, DO   Triage Vitals: BP 104/72  Pulse 90  Temp(Src) 98.3 F (36.8 C) (Oral)  Resp 16  SpO2 97%  Physical Exam  Nursing note and vitals reviewed. Constitutional: He is oriented to person, place, and time. He appears well-developed and well-nourished. No distress.  HENT:  Head: Normocephalic and atraumatic.  Mouth/Throat: Oropharynx is clear and moist.  Eyes: Conjunctivae are normal. Pupils are equal, round, and reactive to light.  Neck: Normal range of motion. Neck supple.  Cardiovascular: Normal rate, regular rhythm and normal heart sounds.   Pulmonary/Chest: Effort normal. No respiratory distress. He has decreased breath sounds in the right upper field. He has no wheezes. He has no rales.  Musculoskeletal: Normal range of motion.  Neurological: He is alert and oriented to person, place, and time. He exhibits normal muscle tone. Coordination normal.  Skin: Skin is warm and dry. No  rash noted. No erythema.  Psychiatric: He has a normal mood and affect. His behavior is normal.    ED Course  Procedures (including critical care time) DIAGNOSTIC STUDIES: Oxygen Saturation is 97% on Ra, adequate by my interpretation.    COORDINATION OF CARE: 1:49 PM- Patient informed of current plan for treatment and evaluation and agrees with plan at this time Patient is maintained his oxygen saturation and stable at this time.  We'll have him follow-up [place] urgent care to be treated for community-acquired pneumonia Imaging Review Dg Chest 2 View  04/10/2014   CLINICAL DATA:  Eight days of productive cough. Coughing is painful.  EXAM: CHEST  2 VIEW  COMPARISON:  03/30/2011.  FINDINGS: The cardiac silhouette, mediastinal and hilar contours are within normal limits and stable. There are mild chronic bronchitic type changes in the lungs with a superimposed right upper lobe pneumonia.  IMPRESSION: Right upper lobe pneumonia.   Electronically Signed   By: Kalman Jewels M.D.   On: 04/10/2014 13:51   I personally performed the services described in this documentation, which was scribed in my presence. The recorded information has been reviewed and is accurate.   Brent General, PA-C 04/10/14 1455

## 2015-06-21 ENCOUNTER — Ambulatory Visit (INDEPENDENT_AMBULATORY_CARE_PROVIDER_SITE_OTHER): Payer: Self-pay

## 2015-06-21 ENCOUNTER — Ambulatory Visit (INDEPENDENT_AMBULATORY_CARE_PROVIDER_SITE_OTHER): Payer: Self-pay | Admitting: Family Medicine

## 2015-06-21 VITALS — BP 138/74 | HR 96 | Temp 97.4°F | Resp 19

## 2015-06-21 DIAGNOSIS — R062 Wheezing: Secondary | ICD-10-CM

## 2015-06-21 DIAGNOSIS — R059 Cough, unspecified: Secondary | ICD-10-CM

## 2015-06-21 DIAGNOSIS — R05 Cough: Secondary | ICD-10-CM

## 2015-06-21 DIAGNOSIS — J209 Acute bronchitis, unspecified: Secondary | ICD-10-CM

## 2015-06-21 LAB — POCT CBC
Granulocyte percent: 55.1 %G (ref 37–80)
HCT, POC: 44.8 % (ref 43.5–53.7)
Hemoglobin: 15.2 g/dL (ref 14.1–18.1)
Lymph, poc: 3.1 (ref 0.6–3.4)
MCH, POC: 28.3 pg (ref 27–31.2)
MCHC: 34 g/dL (ref 31.8–35.4)
MCV: 83.3 fL (ref 80–97)
MID (cbc): 0.3 (ref 0–0.9)
MPV: 7.1 fL (ref 0–99.8)
POC Granulocyte: 4.1 (ref 2–6.9)
POC LYMPH PERCENT: 41.5 %L (ref 10–50)
POC MID %: 3.4 %M (ref 0–12)
Platelet Count, POC: 179 10*3/uL (ref 142–424)
RBC: 5.38 M/uL (ref 4.69–6.13)
RDW, POC: 13.1 %
WBC: 7.5 10*3/uL (ref 4.6–10.2)

## 2015-06-21 MED ORDER — AMOXICILLIN 875 MG PO TABS: 875.0000 mg | ORAL_TABLET | Freq: Two times a day (BID) | ORAL | Status: DC

## 2015-06-21 MED ORDER — HYDROCODONE-HOMATROPINE 5-1.5 MG/5ML PO SYRP: 5.0000 mL | ORAL_SOLUTION | ORAL | Status: DC | PRN

## 2015-06-21 MED ORDER — ALBUTEROL SULFATE (2.5 MG/3ML) 0.083% IN NEBU
2.5000 mg | INHALATION_SOLUTION | Freq: Once | RESPIRATORY_TRACT | Status: AC
  Administered 2015-06-21: 2.5 mg via RESPIRATORY_TRACT

## 2015-06-21 MED ORDER — ALBUTEROL SULFATE HFA 108 (90 BASE) MCG/ACT IN AERS: 2.0000 | INHALATION_SPRAY | RESPIRATORY_TRACT | Status: DC | PRN

## 2015-06-21 NOTE — Patient Instructions (Addendum)
Drink plenty of fluids and get enough rest  Use the albuterol inhaler 2 inhalations every 4-6 hours as needed for wheezing.  Take the amoxicillin one twice daily for infection  Take the Hycodan cough syrup 1 teaspoon every 4-6 hours as needed for cough  Return if not improving

## 2015-06-21 NOTE — Progress Notes (Signed)
Jeff Norton, male    DOB: [birth year]  Age: 50 y.o. MRN: [id number]  Chief Complaint  Patient presents with  . Cough    Says "for a while"  . Shortness of Breath    Subjective:   50 year old man who is here with a history of being sick this week. He has had a lot of head congestion but not below much out. He has been coughing up yellow phlegm and green phlegm. He smokes a couple cigarettes a day. He is short of breath. 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   Current allergies, medications, problem list, past/family and social histories reviewed.  Objective:  BP 138/74 mmHg  Pulse 96  Temp(Src) 97.4 F (36.3 C) (Oral)  Resp 19  SpO2 96%  Obviously short of breath coughing a lot with a very tight cough. His TMs are normal. Throat clear. Neck supple without nodes. Chest has diffuse rhonchi and wheezes bilaterally. Heart regular without murmur. Sinuses nontender the head is very congested.  Assessment & Plan:   Assessment: 1. Cough   2. Wheezing   3. Acute bronchitis, unspecified organism       Plan: Peak flow attempted. His predicted maximum is 550 and he did 0. Will give an albuterol inhaler feet.  Post nebulizer treatment he does 330 on the peak flow.  Orders Placed This Encounter  Procedures  . DG Chest 2 View    Standing Status: Future     Number of Occurrences: 1     Standing Expiration Date: 06/21/2015    Order Specific Question:  Reason for Exam (SYMPTOM  OR DIAGNOSIS REQUIRED)    Answer:  cough    Order Specific Question:  Preferred imaging location?    Answer:  External  . POCT CBC    Meds ordered this encounter  Medications  . risperiDONE (RISPERDAL) 2 MG tablet    Sig: Take 2 mg by mouth at bedtime.  Marland Kitchen albuterol (PROVENTIL) (2.5 MG/3ML) 0.083% nebulizer solution 2.5 mg    Sig:   . albuterol (PROVENTIL HFA;VENTOLIN HFA) 108 (90 Base) MCG/ACT inhaler    Sig: Inhale 2 puffs into the  lungs every 4 (four) hours as needed for wheezing or shortness of breath (cough, shortness of breath or wheezing.).    Dispense:  1 Inhaler    Refill:  1  . HYDROcodone-homatropine (HYCODAN) 5-1.5 MG/5ML syrup    Sig: Take 5 mLs by mouth every 4 (four) hours as needed.    Dispense:  120 mL    Refill:  0  . amoxicillin (AMOXIL) 875 MG tablet    Sig: Take 1 tablet (875 mg total) by mouth 2 (two) times daily.    Dispense:  20 tablet    Refill:  0   Results for orders placed or performed in visit on 06/21/15  POCT CBC  Result Value Ref Range   WBC 7.5 4.6 - 10.2 K/uL   Lymph, poc 3.1 0.6 - 3.4   POC LYMPH PERCENT 41.5 10 - 50 %L   MID (cbc) 0.3 0 - 0.9   POC MID % 3.4 0 - 12 %M   POC Granulocyte 4.1 2 - 6.9   Granulocyte percent 55.1 37 - 80 %G   RBC 5.38 4.69 - 6.13 M/uL   Hemoglobin 15.2 14.1 - 18.1 g/dL   HCT, POC 44.8 43.5 - 53.7 %   MCV 83.3 80 -  97 fL   MCH, POC 28.3 27 - 31.2 pg   MCHC 34.0 31.8 - 35.4 g/dL   RDW, POC 13.1 %   Platelet Count, POC 179 142 - 424 K/uL   MPV 7.1 0 - 99.8 fL   [facility] reading (PRIMARY) by  Dr. Linna Darner  Normal chest.         Patient Instructions  Drink plenty of fluids and get enough rest  Use the albuterol inhaler 2 inhalations every 4-6 hours as needed for wheezing.  Take the amoxicillin one twice daily for infection  Take the Hycodan cough syrup 1 teaspoon every 4-6 hours as needed for cough  Return if not improving     No Follow-up on file.   Ravin Bendall, MD 06/21/2015

## 2015-06-25 ENCOUNTER — Telehealth: Payer: Self-pay

## 2015-06-25 NOTE — Telephone Encounter (Signed)
Patient was advised to call back if her isn't feeling better and was told that we would send him prednisone.

## 2015-06-26 ENCOUNTER — Other Ambulatory Visit: Payer: Self-pay | Admitting: Family Medicine

## 2015-06-26 DIAGNOSIS — R062 Wheezing: Secondary | ICD-10-CM

## 2015-06-26 MED ORDER — PREDNISONE 20 MG PO TABS: ORAL_TABLET | ORAL | Status: DC

## 2015-06-26 NOTE — Telephone Encounter (Signed)
Will Rx Prednisone taper. I am sending to his pharmacy.

## 2015-06-26 NOTE — Telephone Encounter (Signed)
Called pt and advised message from provider on their voicemail.  

## 2015-07-25 ENCOUNTER — Ambulatory Visit (INDEPENDENT_AMBULATORY_CARE_PROVIDER_SITE_OTHER): Payer: BLUE CROSS/BLUE SHIELD | Admitting: Family Medicine

## 2015-07-25 VITALS — BP 122/80 | HR 77 | Temp 99.0°F | Resp 16 | Ht 69.0 in | Wt 230.0 lb

## 2015-07-25 DIAGNOSIS — Z0000 Encounter for general adult medical examination without abnormal findings: Secondary | ICD-10-CM

## 2015-07-25 DIAGNOSIS — M25571 Pain in right ankle and joints of right foot: Secondary | ICD-10-CM | POA: Diagnosis not present

## 2015-07-25 DIAGNOSIS — R05 Cough: Secondary | ICD-10-CM | POA: Diagnosis not present

## 2015-07-25 DIAGNOSIS — R059 Cough, unspecified: Secondary | ICD-10-CM

## 2015-07-25 DIAGNOSIS — F3162 Bipolar disorder, current episode mixed, moderate: Secondary | ICD-10-CM | POA: Diagnosis not present

## 2015-07-25 DIAGNOSIS — Z23 Encounter for immunization: Secondary | ICD-10-CM | POA: Diagnosis not present

## 2015-07-25 LAB — POCT CBC
Granulocyte percent: 54.1 %G (ref 37–80)
HCT, POC: 44.4 % (ref 43.5–53.7)
Hemoglobin: 15.4 g/dL (ref 14.1–18.1)
Lymph, poc: 2.7 (ref 0.6–3.4)
MCH, POC: 28.7 pg (ref 27–31.2)
MCHC: 34.8 g/dL (ref 31.8–35.4)
MCV: 82.7 fL (ref 80–97)
MID (cbc): 0.6 (ref 0–0.9)
MPV: 8.9 fL (ref 0–99.8)
POC Granulocyte: 3.8 (ref 2–6.9)
POC LYMPH PERCENT: 37.5 %L (ref 10–50)
POC MID %: 8.4 %M (ref 0–12)
Platelet Count, POC: 190 10*3/uL (ref 142–424)
RBC: 5.37 M/uL (ref 4.69–6.13)
RDW, POC: 13.1 %
WBC: 7.1 10*3/uL (ref 4.6–10.2)

## 2015-07-25 LAB — COMPLETE METABOLIC PANEL WITH GFR
ALT: 21 U/L (ref 9–46)
AST: 21 U/L (ref 10–35)
Albumin: 4.1 g/dL (ref 3.6–5.1)
Alkaline Phosphatase: 59 U/L (ref 40–115)
BUN: 11 mg/dL (ref 7–25)
CO2: 27 mmol/L (ref 20–31)
Calcium: 9.5 mg/dL (ref 8.6–10.3)
Chloride: 103 mmol/L (ref 98–110)
Creat: 0.91 mg/dL (ref 0.70–1.33)
GFR, Est African American: >89 mL/min (ref >=60)
GFR, Est Non African American: >89 mL/min (ref >=60)
Glucose, Bld: 101 mg/dL — ABNORMAL HIGH (ref 65–99)
Potassium: 4.4 mmol/L (ref 3.5–5.3)
Sodium: 139 mmol/L (ref 135–146)
Total Bilirubin: 0.6 mg/dL (ref 0.2–1.2)
Total Protein: 7.1 g/dL (ref 6.1–8.1)

## 2015-07-25 LAB — POCT URINALYSIS DIP (MANUAL ENTRY)
Bilirubin, UA: NEGATIVE
Blood, UA: NEGATIVE
Glucose, UA: NEGATIVE
Ketones, POC UA: NEGATIVE
Leukocytes, UA: NEGATIVE
Nitrite, UA: NEGATIVE
Protein Ur, POC: NEGATIVE
Spec Grav, UA: 1.02
Urobilinogen, UA: 0.2
pH, UA: 5.5

## 2015-07-25 LAB — TSH: TSH: 2.18 mIU/L (ref 0.40–4.50)

## 2015-07-25 LAB — LIPID PANEL
Cholesterol: 189 mg/dL (ref 125–200)
HDL: 36 mg/dL — ABNORMAL LOW (ref >=40)
LDL Cholesterol: 114 mg/dL (ref <130)
Total CHOL/HDL Ratio: 5.3 Ratio — ABNORMAL HIGH (ref <=5.0)
Triglycerides: 194 mg/dL — ABNORMAL HIGH (ref <150)
VLDL: 39 mg/dL — ABNORMAL HIGH (ref <30)

## 2015-07-25 MED ORDER — CLONAZEPAM 0.5 MG PO TABS: 0.5000 mg | ORAL_TABLET | Freq: Every day | ORAL | Status: DC

## 2015-07-25 MED ORDER — ALBUTEROL SULFATE 108 (90 BASE) MCG/ACT IN AEPB: 2.0000 | INHALATION_SPRAY | Freq: Four times a day (QID) | RESPIRATORY_TRACT | Status: DC | PRN

## 2015-07-25 NOTE — Patient Instructions (Signed)
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
## 2015-07-26 ENCOUNTER — Encounter: Payer: Self-pay | Admitting: *Deleted

## 2015-09-18 LAB — HM COLONOSCOPY: HM Colonoscopy: NORMAL

## 2015-09-26 LAB — HM DIABETES EYE EXAM

## 2015-10-22 ENCOUNTER — Telehealth: Payer: Self-pay | Admitting: General Practice

## 2015-10-22 NOTE — Telephone Encounter (Signed)
Pt would like to know if you would be willing to take him on has  A new pt?

## 2015-10-24 ENCOUNTER — Telehealth: Payer: Self-pay

## 2015-10-24 NOTE — Telephone Encounter (Signed)
Per dustin, patient is friend of bob goldstein, would like to have dr [name] as his pcp---routing to dr [name] to ask if ok---please advise, thanks

## 2015-10-28 NOTE — Telephone Encounter (Signed)
yes

## 2015-11-07 ENCOUNTER — Ambulatory Visit (INDEPENDENT_AMBULATORY_CARE_PROVIDER_SITE_OTHER)
Admission: RE | Admit: 2015-11-07 | Discharge: 2015-11-07 | Disposition: A | Discharge status: Home / Self Care | Payer: BLUE CROSS/BLUE SHIELD | Source: Ambulatory Visit | Attending: Internal Medicine | Admitting: Internal Medicine

## 2015-11-07 ENCOUNTER — Encounter: Payer: Self-pay | Admitting: Internal Medicine

## 2015-11-07 ENCOUNTER — Other Ambulatory Visit (INDEPENDENT_AMBULATORY_CARE_PROVIDER_SITE_OTHER): Payer: BLUE CROSS/BLUE SHIELD

## 2015-11-07 ENCOUNTER — Ambulatory Visit (INDEPENDENT_AMBULATORY_CARE_PROVIDER_SITE_OTHER): Payer: BLUE CROSS/BLUE SHIELD | Admitting: Internal Medicine

## 2015-11-07 VITALS — BP 118/72 | HR 78 | Temp 98.4°F | Resp 16 | Wt 224.0 lb

## 2015-11-07 DIAGNOSIS — D696 Thrombocytopenia, unspecified: Secondary | ICD-10-CM

## 2015-11-07 DIAGNOSIS — R05 Cough: Secondary | ICD-10-CM | POA: Diagnosis not present

## 2015-11-07 DIAGNOSIS — J438 Other emphysema: Secondary | ICD-10-CM

## 2015-11-07 DIAGNOSIS — Z23 Encounter for immunization: Secondary | ICD-10-CM

## 2015-11-07 DIAGNOSIS — Z0000 Encounter for general adult medical examination without abnormal findings: Secondary | ICD-10-CM | POA: Insufficient documentation

## 2015-11-07 DIAGNOSIS — E118 Type 2 diabetes mellitus with unspecified complications: Secondary | ICD-10-CM | POA: Insufficient documentation

## 2015-11-07 DIAGNOSIS — R739 Hyperglycemia, unspecified: Secondary | ICD-10-CM

## 2015-11-07 DIAGNOSIS — J449 Chronic obstructive pulmonary disease, unspecified: Secondary | ICD-10-CM | POA: Insufficient documentation

## 2015-11-07 DIAGNOSIS — J4489 Other specified chronic obstructive pulmonary disease: Secondary | ICD-10-CM | POA: Insufficient documentation

## 2015-11-07 DIAGNOSIS — R0609 Other forms of dyspnea: Secondary | ICD-10-CM | POA: Diagnosis not present

## 2015-11-07 DIAGNOSIS — R0600 Dyspnea, unspecified: Secondary | ICD-10-CM

## 2015-11-07 DIAGNOSIS — R059 Cough, unspecified: Secondary | ICD-10-CM

## 2015-11-07 DIAGNOSIS — Z0001 Encounter for general adult medical examination with abnormal findings: Secondary | ICD-10-CM

## 2015-11-07 DIAGNOSIS — J4520 Mild intermittent asthma, uncomplicated: Secondary | ICD-10-CM

## 2015-11-07 LAB — COMPREHENSIVE METABOLIC PANEL
ALT: 19 U/L (ref 0–53)
AST: 13 U/L (ref 0–37)
Albumin: 4.3 g/dL (ref 3.5–5.2)
Alkaline Phosphatase: 74 U/L (ref 39–117)
BUN: 15 mg/dL (ref 6–23)
CO2: 31 mEq/L (ref 19–32)
Calcium: 9.6 mg/dL (ref 8.4–10.5)
Chloride: 102 mEq/L (ref 96–112)
Creatinine, Ser: 1 mg/dL (ref 0.40–1.50)
GFR: 83.67 mL/min (ref >60.00)
Glucose, Bld: 163 mg/dL — ABNORMAL HIGH (ref 70–99)
Potassium: 4.4 mEq/L (ref 3.5–5.1)
Sodium: 140 mEq/L (ref 135–145)
Total Bilirubin: 0.8 mg/dL (ref 0.2–1.2)
Total Protein: 6.7 g/dL (ref 6.0–8.3)

## 2015-11-07 LAB — FECAL OCCULT BLOOD, GUAIAC: Fecal Occult Blood: NEGATIVE

## 2015-11-07 LAB — URINALYSIS, ROUTINE W REFLEX MICROSCOPIC
Bilirubin Urine: NEGATIVE
Hgb urine dipstick: NEGATIVE
Ketones, ur: NEGATIVE
Leukocytes, UA: NEGATIVE
Nitrite: NEGATIVE
RBC / HPF: NONE SEEN (ref 0–?)
Specific Gravity, Urine: 1.02 (ref 1.000–1.030)
Urine Glucose: NEGATIVE
Urobilinogen, UA: 0.2 (ref 0.0–1.0)
pH: 6 (ref 5.0–8.0)

## 2015-11-07 LAB — CBC WITH DIFFERENTIAL/PLATELET
Basophils Absolute: 0 10*3/uL (ref 0.0–0.1)
Basophils Relative: 0.5 % (ref 0.0–3.0)
Eosinophils Absolute: 0.4 10*3/uL (ref 0.0–0.7)
Eosinophils Relative: 4.5 % (ref 0.0–5.0)
HCT: 46.8 % (ref 39.0–52.0)
Hemoglobin: 15.9 g/dL (ref 13.0–17.0)
Lymphocytes Relative: 22.8 % (ref 12.0–46.0)
Lymphs Abs: 2.1 10*3/uL (ref 0.7–4.0)
MCHC: 34 g/dL (ref 30.0–36.0)
MCV: 83 fl (ref 78.0–100.0)
Monocytes Absolute: 0.8 10*3/uL (ref 0.1–1.0)
Monocytes Relative: 8.3 % (ref 3.0–12.0)
Neutro Abs: 5.9 10*3/uL (ref 1.4–7.7)
Neutrophils Relative %: 63.9 % (ref 43.0–77.0)
Platelets: 187 10*3/uL (ref 150.0–400.0)
RBC: 5.64 Mil/uL (ref 4.22–5.81)
RDW: 13.2 % (ref 11.5–15.5)
WBC: 9.2 10*3/uL (ref 4.0–10.5)

## 2015-11-07 LAB — HIV ANTIBODY (ROUTINE TESTING W REFLEX): HIV 1&2 Ab, 4th Generation: NONREACTIVE

## 2015-11-07 LAB — LIPID PANEL
Cholesterol: 186 mg/dL (ref 0–200)
HDL: 34.7 mg/dL — ABNORMAL LOW (ref >39.00)
LDL Cholesterol: 115 mg/dL — ABNORMAL HIGH (ref 0–99)
NonHDL: 151.1
Total CHOL/HDL Ratio: 5
Triglycerides: 180 mg/dL — ABNORMAL HIGH (ref 0.0–149.0)
VLDL: 36 mg/dL (ref 0.0–40.0)

## 2015-11-07 LAB — PSA: PSA: 0.47 ng/mL (ref 0.10–4.00)

## 2015-11-07 LAB — TSH: TSH: 1.53 u[IU]/mL (ref 0.35–4.50)

## 2015-11-07 LAB — HEMOGLOBIN A1C: Hgb A1c MFr Bld: 6.6 % — ABNORMAL HIGH (ref 4.6–6.5)

## 2015-11-07 MED ORDER — FLUTICASONE FUROATE-VILANTEROL 200-25 MCG/INH IN AEPB: 1.0000 | INHALATION_SPRAY | Freq: Every day | RESPIRATORY_TRACT | Status: DC

## 2015-11-07 NOTE — Progress Notes (Signed)
Subjective:  Patient ID: Jeff Norton, male    DOB: 01/21/1966  Age: 50 y.o. MRN: [id number]  CC: Asthma; Cough; and Annual Exam  New to me  HPI Jeff Norton presents for a CPX.  He complains of chronic, intermittent cough for several years. He occasionally has exacerbations and is treated with albuterol inhaler as well as systemic steroids. He tells me he gets modest symptom relief with albuterol. He reports a history of asthma but also has an extensive history of tobacco abuse. He says the cough is productive of a thin/white phlegm. He does complain of wheezing and dyspnea on exertion but denies chest pain, diaphoresis, edema, syncope, or neck pain.  He has a history of hyperglycemia & needs to be screened for diabetes.    History Jeff Norton has a past medical history of Allergy; Asthma; Bipolar 1 disorder ([place]); Anxiety; Depression; and GERD (gastroesophageal reflux disease).   He has past surgical history that includes Gallbladder surgery; Fracture surgery; Cyst removal hand (1997); Cholecystectomy; and Spine surgery.   His family history includes Cancer in his maternal grandmother; Depression in his sister; Diabetes in his father and sister; Hyperlipidemia in his father and sister; Hypertension in his mother; Mental illness in his maternal grandmother and mother.He reports that he has been smoking Cigarettes.  He has a 45 pack-year smoking history. He has never used smokeless tobacco. He reports that he drinks about 9.0 oz of alcohol per week. He reports that he does not use illicit drugs.  Outpatient Prescriptions Prior to Visit  Medication Sig Dispense Refill  . Albuterol Sulfate (PROAIR RESPICLICK) 123XX123 (90 Base) MCG/ACT AEPB Inhale 2 puffs into the lungs every 6 (six) hours as needed. 1 each 3  . clonazePAM (KLONOPIN) 0.5 MG tablet Take 1 tablet (0.5 mg total) by mouth daily. 30 tablet 5  . LamoTRIgine (LAMICTAL PO) Take 400 mg by mouth daily.     . risperiDONE (RISPERDAL) 2 MG  tablet Take 2 mg by mouth 2 (two) times daily.     Marland Kitchen zolpidem (AMBIEN) 10 MG tablet Take 10 mg by mouth at bedtime as needed for sleep (sleep). Reported on 06/21/2015    . predniSONE (DELTASONE) 20 MG tablet Take 3 daily for 2 days, then 2 daily for 2 days, then 1 daily for 2 days 12 tablet 0   No facility-administered medications prior to visit.    ROS Review of Systems  Constitutional: Negative.  Negative for fever, chills, diaphoresis, activity change, appetite change, fatigue and unexpected weight change.  HENT: Negative.  Negative for congestion, facial swelling, sinus pressure, sore throat, trouble swallowing and voice change.   Eyes: Negative.  Negative for visual disturbance.  Respiratory: Positive for cough, shortness of breath and wheezing. Negative for apnea, choking, chest tightness and stridor.   Cardiovascular: Negative.  Negative for chest pain, palpitations and leg swelling.  Gastrointestinal: Negative.  Negative for nausea, vomiting, abdominal pain, diarrhea, constipation and blood in stool.  Endocrine: Negative.  Negative for polydipsia, polyphagia and polyuria.  Genitourinary: Negative.   Musculoskeletal: Negative.  Negative for myalgias, back pain and arthralgias.  Skin: Negative.  Negative for color change and rash.  Allergic/Immunologic: Negative.   Neurological: Negative.  Negative for dizziness, tremors, weakness, light-headedness and numbness.  Hematological: Negative.  Negative for adenopathy. Does not bruise/bleed easily.  Psychiatric/Behavioral: Negative.     Objective:  BP 118/72 mmHg  Pulse 78  Temp(Src) 98.4 F (36.9 C)  Resp 16  Wt 224 lb (101.606 kg)  SpO2 94%  Physical Exam  Constitutional: He appears well-developed and well-nourished. No distress.  HENT:  Mouth/Throat: Oropharynx is clear and moist. No oropharyngeal exudate.  Eyes: Conjunctivae are normal. Right eye exhibits no discharge. Left eye exhibits no discharge. No scleral icterus.    Neck: Normal range of motion. Neck supple. No JVD present. No tracheal deviation present. No thyromegaly present.  Cardiovascular: Normal rate, regular rhythm, normal heart sounds and intact distal pulses.  Exam reveals no gallop and no friction rub.   No murmur heard. EKG ----  Sinus  Rhythm  -Prominent R(V1) and right axis -consider right ventricular hypertrophy  Low voltage -possible pulmonary disease.   ABNORMAL   Pulmonary/Chest: Effort normal. No accessory muscle usage or stridor. No respiratory distress. He has decreased breath sounds in the right upper field, the right middle field, the right lower field, the left upper field, the left middle field and the left lower field. He has no wheezes. He has no rhonchi. He has no rales. He exhibits no tenderness.  Abdominal: Soft. Bowel sounds are normal. He exhibits no distension and no mass. There is no tenderness. There is no rebound and no guarding. Hernia confirmed negative in the right inguinal area and confirmed negative in the left inguinal area.  Genitourinary: Rectum normal, prostate normal, testes normal and penis normal. Rectal exam shows no external hemorrhoid, no internal hemorrhoid, no fissure, no mass, no tenderness and anal tone normal. Guaiac negative stool. Prostate is not enlarged and not tender. Right testis shows no mass, no swelling and no tenderness. Right testis is descended. Left testis shows no mass, no swelling and no tenderness. Left testis is descended. Circumcised. No penile erythema or penile tenderness. No discharge found.  Lymphadenopathy:    He has no cervical adenopathy.       Right: No inguinal adenopathy present.       Left: No inguinal adenopathy present.  Skin: He is not diaphoretic.  Vitals reviewed.   Lab Results  Component Value Date   WBC 9.2 11/07/2015   HGB 15.9 11/07/2015   HCT 46.8 11/07/2015   PLT 187.0 11/07/2015   GLUCOSE 163* 11/07/2015   CHOL 186 11/07/2015   TRIG 180.0* 11/07/2015    HDL 34.70* 11/07/2015   LDLCALC 115* 11/07/2015   ALT 19 11/07/2015   AST 13 11/07/2015   NA 140 11/07/2015   K 4.4 11/07/2015   CL 102 11/07/2015   CREATININE 1.00 11/07/2015   BUN 15 11/07/2015   CO2 31 11/07/2015   TSH 1.53 11/07/2015   PSA 0.47 11/07/2015   HGBA1C 6.6* 11/07/2015    Assessment & Plan:   Aspyn was seen today for asthma, cough and annual exam.  Diagnoses and all orders for this visit:  Asthma, mild intermittent, poorly controlled- I've asked him to start treating this with a combination of an ICS/LABA. -     fluticasone furoate-vilanterol (BREO ELLIPTA) 200-25 MCG/INH AEPB; Inhale 1 puff into the lungs daily. -     Pulmonary Function Test; Future  EMPHYSEMA- his chest x-ray today confirms this, he was asked to quit smoking and will start treating with inhaler therapy -     Pulmonary Function Test; Future  Cough- his signs and symptoms, exam, and chest x-ray are consistent with COPD, will treat -     DG Chest 2 View; Future -     Pulmonary Function Test; Future  Thrombocytopenia ([place])- this has resolved, screening for hepatitis C and HIV are both negative -  Hepatitis C antibody; Future -     HIV antibody; Future  Hyperglycemia- his A1c is 6.6%, this is new onset diabetes mellitus but no medical therapy is needed at this time, he agrees to work on his lifestyle modifications with diet/exercise/weight loss. -     Hemoglobin A1c; Future  Routine general medical examination at a health care facility- Exam completed, labs ordered and reviewed, his colonoscopy is up-to-date, vaccines reviewed and updated, he was given patient education material. -     Lipid panel; Future -     Comprehensive metabolic panel; Future -     CBC with Differential/Platelet; Future -     TSH; Future -     Urinalysis, Routine w reflex microscopic (not at [facility]); Future -     PSA; Future  DOE (dyspnea on exertion)- his signs and symptoms, examination, and EKG are consistent with a  pulmonary process, I do not see any concerns for cardiac ischemia at this time, he was asked to quit smoking and lose weight and I will treat his lung disease. -     EKG 12-Lead  Need for 23-polyvalent pneumococcal polysaccharide vaccine -     Pneumococcal polysaccharide vaccine 23-valent greater than or equal to 2yo subcutaneous/IM   I have discontinued Mr. Bean predniSONE. I am also having him start on fluticasone furoate-vilanterol. Additionally, I am having him maintain his LamoTRIgine (LAMICTAL PO), zolpidem, risperiDONE, clonazePAM, and Albuterol Sulfate.  Meds ordered this encounter  Medications  . fluticasone furoate-vilanterol (BREO ELLIPTA) 200-25 MCG/INH AEPB    Sig: Inhale 1 puff into the lungs daily.    Dispense:  30 each    Refill:  11     Follow-up: Return in about 3 months (around 02/07/2016).  Scarlette Calico, MD

## 2015-11-07 NOTE — Patient Instructions (Signed)

## 2015-11-07 NOTE — Progress Notes (Signed)
Pre visit review using our clinic review tool, if applicable. No additional management support is needed unless otherwise documented below in the visit note. 

## 2015-11-08 ENCOUNTER — Encounter: Payer: Self-pay | Admitting: Internal Medicine

## 2015-11-08 LAB — HEPATITIS C ANTIBODY: HCV Ab: NEGATIVE

## 2015-11-13 ENCOUNTER — Other Ambulatory Visit: Payer: Self-pay | Admitting: Family Medicine

## 2015-11-23 DIAGNOSIS — H5213 Myopia, bilateral: Secondary | ICD-10-CM | POA: Diagnosis not present

## 2015-12-13 ENCOUNTER — Ambulatory Visit (INDEPENDENT_AMBULATORY_CARE_PROVIDER_SITE_OTHER): Payer: Medicare Other | Admitting: Internal Medicine

## 2015-12-13 ENCOUNTER — Encounter (INDEPENDENT_AMBULATORY_CARE_PROVIDER_SITE_OTHER): Payer: Self-pay

## 2015-12-13 DIAGNOSIS — R059 Cough, unspecified: Secondary | ICD-10-CM

## 2015-12-13 DIAGNOSIS — R05 Cough: Secondary | ICD-10-CM

## 2015-12-13 DIAGNOSIS — J4520 Mild intermittent asthma, uncomplicated: Secondary | ICD-10-CM | POA: Diagnosis not present

## 2015-12-13 DIAGNOSIS — J438 Other emphysema: Secondary | ICD-10-CM

## 2015-12-13 LAB — PULMONARY FUNCTION TEST
DL/VA % pred: 95 %
DL/VA: 4.38 ml/min/mmHg/L
DLCO cor % pred: 92 %
DLCO cor: 29.32 ml/min/mmHg
DLCO unc % pred: 93 %
DLCO unc: 29.49 ml/min/mmHg
FEF 25-75 Post: 1.28 L/sec
FEF 25-75 Pre: 1.07 L/sec
FEF2575-%Change-Post: 20 %
FEF2575-%Pred-Post: 38 %
FEF2575-%Pred-Pre: 31 %
FEV1-%Change-Post: 8 %
FEV1-%Pred-Post: 57 %
FEV1-%Pred-Pre: 52 %
FEV1-Post: 2.19 L
FEV1-Pre: 2.02 L
FEV1FVC-%Change-Post: 2 %
FEV1FVC-%Pred-Pre: 76 %
FEV6-%Change-Post: 6 %
FEV6-%Pred-Post: 73 %
FEV6-%Pred-Pre: 69 %
FEV6-Post: 3.51 L
FEV6-Pre: 3.3 L
FEV6FVC-%Change-Post: 0 %
FEV6FVC-%Pred-Post: 101 %
FEV6FVC-%Pred-Pre: 100 %
FVC-%Change-Post: 5 %
FVC-%Pred-Post: 72 %
FVC-%Pred-Pre: 68 %
FVC-Post: 3.6 L
FVC-Pre: 3.41 L
Post FEV1/FVC ratio: 61 %
Post FEV6/FVC ratio: 98 %
Pre FEV1/FVC ratio: 59 %
Pre FEV6/FVC Ratio: 97 %
RV % pred: 169 %
RV: 3.44 L
TLC % pred: 102 %
TLC: 7.07 L

## 2015-12-13 NOTE — Progress Notes (Signed)
PFT done today. 

## 2015-12-16 ENCOUNTER — Other Ambulatory Visit: Payer: Self-pay | Admitting: Internal Medicine

## 2015-12-16 ENCOUNTER — Encounter: Payer: Self-pay | Admitting: Internal Medicine

## 2015-12-17 ENCOUNTER — Other Ambulatory Visit: Payer: Self-pay | Admitting: Internal Medicine

## 2015-12-17 DIAGNOSIS — J449 Chronic obstructive pulmonary disease, unspecified: Secondary | ICD-10-CM

## 2015-12-17 DIAGNOSIS — J4520 Mild intermittent asthma, uncomplicated: Secondary | ICD-10-CM

## 2015-12-17 MED ORDER — ALBUTEROL SULFATE 108 (90 BASE) MCG/ACT IN AEPB: 2.0000 | INHALATION_SPRAY | Freq: Four times a day (QID) | RESPIRATORY_TRACT | Status: DC | PRN

## 2015-12-17 NOTE — Telephone Encounter (Signed)
Continue with albuterol and breo? Pt is rq rf of albuterol.

## 2015-12-20 ENCOUNTER — Ambulatory Visit: Payer: Medicare Other | Admitting: Internal Medicine

## 2016-01-01 ENCOUNTER — Encounter: Payer: Self-pay | Admitting: Internal Medicine

## 2016-01-01 ENCOUNTER — Ambulatory Visit (INDEPENDENT_AMBULATORY_CARE_PROVIDER_SITE_OTHER): Payer: Medicare Other | Admitting: Internal Medicine

## 2016-01-01 VITALS — BP 118/68 | HR 96 | Temp 98.5°F | Resp 16 | Wt 225.0 lb

## 2016-01-01 DIAGNOSIS — S63642A Sprain of metacarpophalangeal joint of left thumb, initial encounter: Secondary | ICD-10-CM | POA: Insufficient documentation

## 2016-01-01 DIAGNOSIS — J45909 Unspecified asthma, uncomplicated: Secondary | ICD-10-CM

## 2016-01-01 DIAGNOSIS — Z720 Tobacco use: Secondary | ICD-10-CM | POA: Diagnosis not present

## 2016-01-01 DIAGNOSIS — J324 Chronic pansinusitis: Secondary | ICD-10-CM | POA: Insufficient documentation

## 2016-01-01 DIAGNOSIS — S5332XA Traumatic rupture of left ulnar collateral ligament, initial encounter: Secondary | ICD-10-CM | POA: Insufficient documentation

## 2016-01-01 DIAGNOSIS — K909 Intestinal malabsorption, unspecified: Secondary | ICD-10-CM

## 2016-01-01 DIAGNOSIS — K9089 Other intestinal malabsorption: Secondary | ICD-10-CM

## 2016-01-01 DIAGNOSIS — G4730 Sleep apnea, unspecified: Secondary | ICD-10-CM

## 2016-01-01 DIAGNOSIS — F17200 Nicotine dependence, unspecified, uncomplicated: Secondary | ICD-10-CM | POA: Diagnosis not present

## 2016-01-01 DIAGNOSIS — S5332XS Traumatic rupture of left ulnar collateral ligament, sequela: Secondary | ICD-10-CM

## 2016-01-01 DIAGNOSIS — S63642S Sprain of metacarpophalangeal joint of left thumb, sequela: Secondary | ICD-10-CM

## 2016-01-01 DIAGNOSIS — E118 Type 2 diabetes mellitus with unspecified complications: Secondary | ICD-10-CM | POA: Diagnosis not present

## 2016-01-01 DIAGNOSIS — J449 Chronic obstructive pulmonary disease, unspecified: Secondary | ICD-10-CM | POA: Diagnosis not present

## 2016-01-01 MED ORDER — VARENICLINE TARTRATE 0.5 MG X 11 & 1 MG X 42 PO MISC: ORAL | Status: DC

## 2016-01-01 MED ORDER — VARENICLINE TARTRATE 1 MG PO TABS: 1.0000 mg | ORAL_TABLET | Freq: Two times a day (BID) | ORAL | Status: DC

## 2016-01-01 NOTE — Progress Notes (Signed)
Pre visit review using our clinic review tool, if applicable. No additional management support is needed unless otherwise documented below in the visit note. 

## 2016-01-01 NOTE — Patient Instructions (Signed)
Chronic Obstructive Pulmonary Disease Chronic obstructive pulmonary disease (COPD) is a common lung condition in which airflow from the lungs is limited. COPD is a general term that can be used to describe many different lung problems that limit airflow, including both chronic bronchitis and emphysema. If you have COPD, your lung function will probably never return to normal, but there are measures you can take to improve lung function and make yourself feel better. CAUSES   Smoking (common).  Exposure to secondhand smoke.  Genetic problems.  Chronic inflammatory lung diseases or recurrent infections. SYMPTOMS  Shortness of breath, especially with physical activity.  Deep, persistent (chronic) cough with a large amount of thick mucus.  Wheezing.  Rapid breaths (tachypnea).  Gray or bluish discoloration (cyanosis) of the skin, especially in your fingers, toes, or lips.  Fatigue.  Weight loss.  Frequent infections or episodes when breathing symptoms become much worse (exacerbations).  Chest tightness. DIAGNOSIS Your health care provider will take a medical history and perform a physical examination to diagnose COPD. Additional tests for COPD may include:  Lung (pulmonary) function tests.  Chest X-ray.  CT scan.  Blood tests. TREATMENT  Treatment for COPD may include:  Inhaler and nebulizer medicines. These help manage the symptoms of COPD and make your breathing more comfortable.  Supplemental oxygen. Supplemental oxygen is only helpful if you have a low oxygen level in your blood.  Exercise and physical activity. These are beneficial for nearly all people with COPD.  Lung surgery or transplant.  Nutrition therapy to gain weight, if you are underweight.  Pulmonary rehabilitation. This may involve working with a team of health care providers and specialists, such as respiratory, occupational, and physical therapists. HOME CARE INSTRUCTIONS  Take all medicines  (inhaled or pills) as directed by your health care provider.  Avoid over-the-counter medicines or cough syrups that dry up your airway (such as antihistamines) and slow down the elimination of secretions unless instructed otherwise by your health care provider.  If you are a smoker, the most important thing that you can do is stop smoking. Continuing to smoke will cause further lung damage and breathing trouble. Ask your health care provider for help with quitting smoking. He or she can direct you to community resources or hospitals that provide support.  Avoid exposure to irritants such as smoke, chemicals, and fumes that aggravate your breathing.  Use oxygen therapy and pulmonary rehabilitation if directed by your health care provider. If you require home oxygen therapy, ask your health care provider whether you should purchase a pulse oximeter to measure your oxygen level at home.  Avoid contact with individuals who have a contagious illness.  Avoid extreme temperature and humidity changes.  Eat healthy foods. Eating smaller, more frequent meals and resting before meals may help you maintain your strength.  Stay active, but balance activity with periods of rest. Exercise and physical activity will help you maintain your ability to do things you want to do.  Preventing infection and hospitalization is very important when you have COPD. Make sure to receive all the vaccines your health care provider recommends, especially the pneumococcal and influenza vaccines. Ask your health care provider whether you need a pneumonia vaccine.  Learn and use relaxation techniques to manage stress.  Learn and use controlled breathing techniques as directed by your health care provider. Controlled breathing techniques include:  Pursed lip breathing. Start by breathing in (inhaling) through your nose for 1 second. Then, purse your lips as if you were   going to whistle and breathe out (exhale) through the  pursed lips for 2 seconds.  Diaphragmatic breathing. Start by putting one hand on your abdomen just above your waist. Inhale slowly through your nose. The hand on your abdomen should move out. Then purse your lips and exhale slowly. You should be able to feel the hand on your abdomen moving in as you exhale.  Learn and use controlled coughing to clear mucus from your lungs. Controlled coughing is a series of short, progressive coughs. The steps of controlled coughing are: 1. Lean your head slightly forward. 2. Breathe in deeply using diaphragmatic breathing. 3. Try to hold your breath for 3 seconds. 4. Keep your mouth slightly open while coughing twice. 5. Spit any mucus out into a tissue. 6. Rest and repeat the steps once or twice as needed. SEEK MEDICAL CARE IF:  You are coughing up more mucus than usual.  There is a change in the color or thickness of your mucus.  Your breathing is more labored than usual.  Your breathing is faster than usual. SEEK IMMEDIATE MEDICAL CARE IF:  You have shortness of breath while you are resting.  You have shortness of breath that prevents you from:  Being able to talk.  Performing your usual physical activities.  You have chest pain lasting longer than 5 minutes.  Your skin color is more cyanotic than usual.  You measure low oxygen saturations for longer than 5 minutes with a pulse oximeter. MAKE SURE YOU:  Understand these instructions.  Will watch your condition.  Will get help right away if you are not doing well or get worse.   This information is not intended to replace advice given to you by your health care provider. Make sure you discuss any questions you have with your health care provider.   Document Released: 03/12/2005 Document Revised: 06/23/2014 Document Reviewed: 01/27/2013 Elsevier Interactive Patient Education 2016 [employer] Inc[employer]  
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
## 2016-01-02 DIAGNOSIS — K9089 Other intestinal malabsorption: Secondary | ICD-10-CM | POA: Insufficient documentation

## 2016-01-02 MED ORDER — COLESTIPOL HCL 1 G PO TABS: 1.0000 g | ORAL_TABLET | Freq: Two times a day (BID) | ORAL | Status: DC

## 2016-01-04 ENCOUNTER — Other Ambulatory Visit: Payer: Self-pay | Admitting: Internal Medicine

## 2016-01-04 DIAGNOSIS — K9089 Other intestinal malabsorption: Secondary | ICD-10-CM

## 2016-01-04 MED ORDER — COLESTIPOL HCL 5 G PO PACK: 5.0000 g | PACK | Freq: Two times a day (BID) | ORAL | Status: DC

## 2016-01-08 ENCOUNTER — Ambulatory Visit (INDEPENDENT_AMBULATORY_CARE_PROVIDER_SITE_OTHER): Payer: Medicare Other | Admitting: Pulmonary Disease

## 2016-01-08 ENCOUNTER — Encounter: Payer: Self-pay | Admitting: Pulmonary Disease

## 2016-01-08 DIAGNOSIS — G4733 Obstructive sleep apnea (adult) (pediatric): Secondary | ICD-10-CM | POA: Diagnosis not present

## 2016-01-08 DIAGNOSIS — Z9989 Dependence on other enabling machines and devices: Principal | ICD-10-CM

## 2016-01-08 NOTE — Patient Instructions (Signed)
Rx for CPAP 11 cm will be sent to DME Download check in 1 month

## 2016-01-08 NOTE — Addendum Note (Signed)
Addended by: Mathis Dad on: 01/08/2016 05:02 PM   Modules accepted: Orders

## 2016-01-08 NOTE — Assessment & Plan Note (Signed)
We will send prescription to DME for new CPAP machine at 11 cm withfull face mask. Download will be checked at this level to ensure good control of events and settings adjusted as necessary-due to his weight gain is suspected he may need slight increase in pressure  Weight loss encouraged, compliance with goal of at least 4-6 hrs every night is the expectation. Advised against medications with sedative side effects Cautioned against driving when sleepy - understanding that sleepiness will vary on a day to day basis

## 2016-01-08 NOTE — Progress Notes (Signed)
Subjective:    Patient ID: Jeff Norton, male    DOB: 03-Feb-1966, 50 y.o.   MRN: [id number]  HPI  Chief Complaint  Patient presents with  . Sleep Consult    Referred by Dr. [name]; has 50 year old machine that is broken  Pt autoset his machine to 4-20 since he has no start button on his machine.  Epworth Score: 34    50 year old presents for management of severe OSA. PSG 03/2006 showed AHI 58/hour which was corrected by CPAP of 11 cm with a medium fullface mask. He has since been maintained on CPAP with dramatic improvement in his daytime somnolence, snoring and fatigue. Lately he has been usingfullface mask due to sinus issues but prior to that he was using nasal pillows. He is scheduled for an ENT appointment. He has been getting supplies from his father. He would like to obtain a new machine since his current machine is broken Epworth sleepiness score is 11 Bedtime is between midnight and 1 AM, s, he uses Ambien about once a week, he sleeps on his side with 2 pillows, reports 3-4 awakeningsbut denies nocturia and is out of bed by 7 AM feeling refreshed with occasional dryness of mouth He is gained about 15 pounds since the sleep study  He is about a 1 pack per day smoker, he is just started on Chantix and set a quit date of August 1. He has bipolar disorder and sees a Social worker at [employer]. He works at a Dentist He also has asthma and symptoms are well controlled on [place]   Past Medical History:  Diagnosis Date  . Allergy   . Anxiety   . Asthma   . Bipolar 1 disorder ([place])   . Depression   . GERD (gastroesophageal reflux disease)    Past Surgical History:  Procedure Laterality Date  . CHOLECYSTECTOMY    . CYST REMOVAL HAND  1997  . FRACTURE SURGERY    . GALLBLADDER SURGERY    . SPINE SURGERY      Allergies  Allergen Reactions  . Azithromycin   . Cleocin [Clindamycin Hcl]      Social History   Social History  . Marital status: Divorced    Spouse name:  N/A  . Number of children: N/A  . Years of education: N/A   Occupational History  . Not on file.   Social History Main Topics  . Smoking status: Current Every Day Smoker    Packs/day: 1.00    Years: 30.00    Types: Cigarettes  . Smokeless tobacco: Never Used  . Alcohol use 9.0 oz/week    15 Cans of beer per week  . Drug use: No  . Sexual activity: Not on file   Other Topics Concern  . Not on file   Social History Narrative  . No narrative on file     Family History  Problem Relation Age of Onset  . Hypertension Mother   . Mental illness Mother   . Diabetes Father   . Hyperlipidemia Father   . Depression Sister   . Diabetes Sister   . Hyperlipidemia Sister   . Cancer Maternal Grandmother   . Mental illness Maternal Grandmother      Review of Systems  Constitutional: Negative for fever and unexpected weight change.  HENT: Negative for congestion, dental problem, ear pain, nosebleeds, postnasal drip, rhinorrhea, sinus pressure, sneezing, sore throat and trouble swallowing.   Eyes: Negative for redness and itching.  Respiratory: Negative for cough, chest tightness, shortness of breath and wheezing.   Cardiovascular: Negative for palpitations and leg swelling.  Gastrointestinal: Negative for nausea and vomiting.  Genitourinary: Negative for dysuria.  Musculoskeletal: Negative for joint swelling.  Skin: Negative for rash.  Neurological: Negative for headaches.  Hematological: Does not bruise/bleed easily.  Psychiatric/Behavioral: Negative for dysphoric mood. The patient is not nervous/anxious.        Objective:   Physical Exam  Gen. Pleasant, obese, in no distress ENT - no lesions, no post nasal drip Neck: No JVD, no thyromegaly, no carotid bruits Lungs: no use of accessory muscles, no dullness to percussion, decreased without rales or rhonchi  Cardiovascular: Rhythm regular, heart sounds  normal, no murmurs or gallops, no peripheral edema Musculoskeletal: No  deformities, no cyanosis or clubbing , no tremors       Assessment & Plan:

## 2016-01-21 ENCOUNTER — Encounter: Payer: Self-pay | Admitting: Pulmonary Disease

## 2016-01-22 DIAGNOSIS — S5332XA Traumatic rupture of left ulnar collateral ligament, initial encounter: Secondary | ICD-10-CM | POA: Diagnosis not present

## 2016-01-22 DIAGNOSIS — M25571 Pain in right ankle and joints of right foot: Secondary | ICD-10-CM | POA: Diagnosis not present

## 2016-01-28 DIAGNOSIS — G4733 Obstructive sleep apnea (adult) (pediatric): Secondary | ICD-10-CM | POA: Diagnosis not present

## 2016-01-29 ENCOUNTER — Other Ambulatory Visit: Payer: Self-pay | Admitting: Orthopedic Surgery

## 2016-01-29 DIAGNOSIS — M79645 Pain in left finger(s): Secondary | ICD-10-CM

## 2016-02-06 DIAGNOSIS — M79645 Pain in left finger(s): Secondary | ICD-10-CM | POA: Diagnosis not present

## 2016-02-11 DIAGNOSIS — M79645 Pain in left finger(s): Secondary | ICD-10-CM | POA: Diagnosis not present

## 2016-02-28 DIAGNOSIS — G4733 Obstructive sleep apnea (adult) (pediatric): Secondary | ICD-10-CM | POA: Diagnosis not present

## 2016-03-03 DIAGNOSIS — M79645 Pain in left finger(s): Secondary | ICD-10-CM | POA: Diagnosis not present

## 2016-03-25 ENCOUNTER — Encounter: Payer: Self-pay | Admitting: Pulmonary Disease

## 2016-03-25 ENCOUNTER — Ambulatory Visit (INDEPENDENT_AMBULATORY_CARE_PROVIDER_SITE_OTHER): Payer: Medicare Other | Admitting: Pulmonary Disease

## 2016-03-25 DIAGNOSIS — Z9989 Dependence on other enabling machines and devices: Secondary | ICD-10-CM

## 2016-03-25 DIAGNOSIS — J324 Chronic pansinusitis: Secondary | ICD-10-CM

## 2016-03-25 DIAGNOSIS — G4733 Obstructive sleep apnea (adult) (pediatric): Secondary | ICD-10-CM

## 2016-03-25 MED ORDER — AMOXICILLIN-POT CLAVULANATE 875-125 MG PO TABS: 1.0000 | ORAL_TABLET | Freq: Two times a day (BID) | ORAL | 0 refills | Status: DC

## 2016-03-25 NOTE — Assessment & Plan Note (Signed)
We'll treat for acute sinusitis with  Prescription for Augmentin 875 twice daily for 7 days  Use store brand Sudafed for 5 days Mucinex as needed

## 2016-03-25 NOTE — Assessment & Plan Note (Signed)
Change on CPAP settings to 10-15 cm  Weight loss encouraged, compliance with goal of at least 4-6 hrs every night is the expectation. Advised against medications with sedative side effects Cautioned against driving when sleepy - understanding that sleepiness will vary on a day to day basis

## 2016-03-25 NOTE — Patient Instructions (Signed)
Change on CPAP settings to 10-15 cm  Prescription for Augmentin 875 twice daily for 7 days  Use store brand Sudafed for 5 days Mucinex as needed

## 2016-03-25 NOTE — Progress Notes (Signed)
   Subjective:    Patient ID: Jeff Norton, male    DOB: 19-Feb-1966, 50 y.o.   MRN: [id number]  HPI  50 year old  Smoker for FU of severe OSA.  He is about a 1 pack per day smoker He has bipolar disorder and sees a Social worker at [employer]. He works at a Dentist He also has asthma and symptoms are well controlled on [place]  03/25/2016  Chief Complaint  Patient presents with  . Follow-up    Doing well on CPAP. Wants to discuss sinus infection.    Last visit we got him a new CPAP machine with auto settings  he is compliant with this and likes the new machine  Download shows average pressure of 13 cm on auto settings 11-20 cm with mild leak and good control of events with excellent compliance  He reports sinusitis with sinus pressure and nasal drainage with yellow mucus for the last few days -this is not improved with over-the-counter medications    Significant tests/ events  PSG 03/2006 showed AHI 58/hour which was corrected by CPAP of 11 cm with a medium fullface mask.     Review of Systems Patient denies significant dyspnea,cough, hemoptysis,  chest pain, palpitations, pedal edema, orthopnea, paroxysmal nocturnal dyspnea, lightheadedness, nausea, vomiting, abdominal or  leg pains      Objective:   Physical Exam  Gen. Pleasant, obese, in no distress ENT - no lesions, no post nasal drip Neck: No JVD, no thyromegaly, no carotid bruits Lungs: no use of accessory muscles, no dullness to percussion, decreased without rales or rhonchi  Cardiovascular: Rhythm regular, heart sounds  normal, no murmurs or gallops, no peripheral edema Musculoskeletal: No deformities, no cyanosis or clubbing , no tremors       Assessment & Plan:

## 2016-03-29 DIAGNOSIS — G4733 Obstructive sleep apnea (adult) (pediatric): Secondary | ICD-10-CM | POA: Diagnosis not present

## 2016-04-14 ENCOUNTER — Encounter: Payer: Self-pay | Admitting: Internal Medicine

## 2016-04-14 ENCOUNTER — Ambulatory Visit (INDEPENDENT_AMBULATORY_CARE_PROVIDER_SITE_OTHER): Payer: Medicare Other | Admitting: Internal Medicine

## 2016-04-14 ENCOUNTER — Other Ambulatory Visit (INDEPENDENT_AMBULATORY_CARE_PROVIDER_SITE_OTHER): Payer: Medicare Other

## 2016-04-14 VITALS — BP 130/74 | HR 81 | Temp 97.8°F | Resp 16 | Ht 69.0 in | Wt 229.0 lb

## 2016-04-14 DIAGNOSIS — Z9989 Dependence on other enabling machines and devices: Secondary | ICD-10-CM

## 2016-04-14 DIAGNOSIS — G4733 Obstructive sleep apnea (adult) (pediatric): Secondary | ICD-10-CM

## 2016-04-14 DIAGNOSIS — Z23 Encounter for immunization: Secondary | ICD-10-CM | POA: Diagnosis not present

## 2016-04-14 DIAGNOSIS — D696 Thrombocytopenia, unspecified: Secondary | ICD-10-CM

## 2016-04-14 DIAGNOSIS — E118 Type 2 diabetes mellitus with unspecified complications: Secondary | ICD-10-CM

## 2016-04-14 DIAGNOSIS — E66811 Obesity, class 1: Secondary | ICD-10-CM

## 2016-04-14 DIAGNOSIS — R400 Somnolence: Secondary | ICD-10-CM

## 2016-04-14 DIAGNOSIS — E669 Obesity, unspecified: Secondary | ICD-10-CM

## 2016-04-14 DIAGNOSIS — R5383 Other fatigue: Secondary | ICD-10-CM

## 2016-04-14 LAB — CBC WITH DIFFERENTIAL/PLATELET
Basophils Absolute: 0.1 10*3/uL (ref 0.0–0.1)
Basophils Relative: 1 % (ref 0.0–3.0)
Eosinophils Absolute: 1 10*3/uL — ABNORMAL HIGH (ref 0.0–0.7)
Eosinophils Relative: 12.6 % — ABNORMAL HIGH (ref 0.0–5.0)
HCT: 40.4 % (ref 39.0–52.0)
Hemoglobin: 13.8 g/dL (ref 13.0–17.0)
Lymphocytes Relative: 35.4 % (ref 12.0–46.0)
Lymphs Abs: 2.8 10*3/uL (ref 0.7–4.0)
MCHC: 34.2 g/dL (ref 30.0–36.0)
MCV: 82.8 fl (ref 78.0–100.0)
Monocytes Absolute: 0.7 10*3/uL (ref 0.1–1.0)
Monocytes Relative: 9.1 % (ref 3.0–12.0)
Neutro Abs: 3.3 10*3/uL (ref 1.4–7.7)
Neutrophils Relative %: 41.9 % — ABNORMAL LOW (ref 43.0–77.0)
Platelets: 178 10*3/uL (ref 150.0–400.0)
RBC: 4.88 Mil/uL (ref 4.22–5.81)
RDW: 13.7 % (ref 11.5–15.5)
WBC: 7.9 10*3/uL (ref 4.0–10.5)

## 2016-04-14 LAB — URINALYSIS, ROUTINE W REFLEX MICROSCOPIC
Bilirubin Urine: NEGATIVE
Hgb urine dipstick: NEGATIVE
Leukocytes, UA: NEGATIVE
Nitrite: NEGATIVE
Specific Gravity, Urine: 1.025 (ref 1.000–1.030)
Total Protein, Urine: NEGATIVE
Urine Glucose: NEGATIVE
Urobilinogen, UA: 0.2 (ref 0.0–1.0)
WBC, UA: NONE SEEN (ref 0–?)
pH: 6 (ref 5.0–8.0)

## 2016-04-14 LAB — COMPREHENSIVE METABOLIC PANEL
ALT: 30 U/L (ref 0–53)
AST: 18 U/L (ref 0–37)
Albumin: 4.3 g/dL (ref 3.5–5.2)
Alkaline Phosphatase: 92 U/L (ref 39–117)
BUN: 17 mg/dL (ref 6–23)
CO2: 29 mEq/L (ref 19–32)
Calcium: 9.4 mg/dL (ref 8.4–10.5)
Chloride: 103 mEq/L (ref 96–112)
Creatinine, Ser: 0.74 mg/dL (ref 0.40–1.50)
GFR: 118.23 mL/min (ref >60.00)
Glucose, Bld: 145 mg/dL — ABNORMAL HIGH (ref 70–99)
Potassium: 4 mEq/L (ref 3.5–5.1)
Sodium: 140 mEq/L (ref 135–145)
Total Bilirubin: 0.4 mg/dL (ref 0.2–1.2)
Total Protein: 7.2 g/dL (ref 6.0–8.3)

## 2016-04-14 LAB — MICROALBUMIN / CREATININE URINE RATIO
Creatinine,U: 132.9 mg/dL
Microalb Creat Ratio: 2 mg/g (ref 0.0–30.0)
Microalb, Ur: 2.6 mg/dL — ABNORMAL HIGH (ref 0.0–1.9)

## 2016-04-14 LAB — THYROID PANEL WITH TSH
Free Thyroxine Index: 2.1 (ref 1.4–3.8)
T3 Uptake: 30 % (ref 22–35)
T4, Total: 7.1 ug/dL (ref 4.5–12.0)
TSH: 1.08 mIU/L (ref 0.40–4.50)

## 2016-04-14 LAB — HEMOGLOBIN A1C: Hgb A1c MFr Bld: 6.6 % — ABNORMAL HIGH (ref 4.6–6.5)

## 2016-04-14 MED ORDER — MODAFINIL 100 MG PO TABS: 100.0000 mg | ORAL_TABLET | Freq: Every day | ORAL | 1 refills | Status: DC

## 2016-04-14 NOTE — Progress Notes (Signed)
Pre visit review using our clinic review tool, if applicable. No additional management support is needed unless otherwise documented below in the visit note. 

## 2016-04-14 NOTE — Progress Notes (Signed)
Subjective:  Patient ID: Jeff Norton, male    DOB: [birth year]  Age: 50 y.o. MRN: [id number]  CC: Diabetes   HPI Jeff Norton presents for follow-up, he complains of severe daytime fatigue and sleepiness. This has been worsening over the last few years. He can sleep as much as 10 or 12 hours and his sleep is not restorative. He has a history of sleep apnea on CPAP but he is not sure if his current CPAP machine is working properly or if it is at the correct titration.  Outpatient Medications Prior to Visit  Medication Sig Dispense Refill  . Albuterol Sulfate (PROAIR RESPICLICK) [id number] (90 Base) MCG/ACT AEPB Inhale 2 puffs into the lungs 4 (four) times daily as needed. 1 each 11  . fluticasone furoate-vilanterol (BREO ELLIPTA) 200-25 MCG/INH AEPB Inhale 1 puff into the lungs daily. 30 each 11  . LamoTRIgine (LAMICTAL PO) Take 400 mg by mouth daily.     . risperiDONE (RISPERDAL) 2 MG tablet Take 2 mg by mouth 2 (two) times daily.     Marland Kitchen amoxicillin-clavulanate (AUGMENTIN) 875-125 MG tablet Take 1 tablet by mouth 2 (two) times daily. 14 tablet 0  . varenicline (CHANTIX STARTING MONTH PAK) 0.5 MG X 11 & 1 MG X 42 tablet Take one 0.5 mg tablet by mouth once daily for 3 days, then increase to one 0.5 mg tablet twice daily for 4 days, then increase to one 1 mg tablet twice daily. 53 tablet 0  . zolpidem (AMBIEN) 10 MG tablet Take 10 mg by mouth at bedtime as needed for sleep (sleep). Reported on 01/01/2016     No facility-administered medications prior to visit.     ROS Review of Systems  Constitutional: Positive for fatigue. Negative for activity change, appetite change, chills and unexpected weight change.  HENT: Negative.  Negative for trouble swallowing.   Eyes: Negative for photophobia and visual disturbance.  Respiratory: Positive for apnea. Negative for cough, choking, chest tightness, shortness of breath, wheezing and stridor.   Cardiovascular: Negative.  Negative for chest pain,  palpitations and leg swelling.  Gastrointestinal: Negative for abdominal pain, blood in stool, constipation, diarrhea, nausea and vomiting.  Endocrine: Negative.  Negative for cold intolerance, heat intolerance, polydipsia, polyphagia and polyuria.  Genitourinary: Negative.  Negative for difficulty urinating.  Musculoskeletal: Negative.  Negative for arthralgias, back pain, myalgias and neck pain.  Skin: Negative.  Negative for color change and rash.  Neurological: Negative.  Negative for dizziness, tremors, weakness, light-headedness and numbness.  Hematological: Negative for adenopathy. Does not bruise/bleed easily.  Psychiatric/Behavioral: Negative.  Negative for decreased concentration, dysphoric mood, self-injury and suicidal ideas. The patient is not nervous/anxious.     Objective:  BP 130/74 (BP Location: Left Arm, Patient Position: Sitting, Cuff Size: Normal)   Pulse 81   Temp 97.8 F (36.6 C) (Oral)   Resp 16   Ht 5\' 9"  (1.753 m)   Wt 229 lb (103.9 kg)   SpO2 96%   BMI 33.82 kg/m   BP Readings from Last 3 Encounters:  04/14/16 130/74  03/25/16 102/62  01/08/16 120/62    Wt Readings from Last 3 Encounters:  04/14/16 229 lb (103.9 kg)  03/25/16 224 lb 9.6 oz (101.9 kg)  01/08/16 226 lb 3.2 oz (102.6 kg)    Physical Exam  Constitutional: He is oriented to person, place, and time. No distress.  HENT:  Mouth/Throat: Oropharynx is clear and moist. No oropharyngeal exudate.  Eyes: Conjunctivae are normal. Right eye  exhibits no discharge. Left eye exhibits no discharge. No scleral icterus.  Neck: Normal range of motion. Neck supple. No JVD present. No tracheal deviation present. No thyromegaly present.  Cardiovascular: Normal rate, regular rhythm, normal heart sounds and intact distal pulses.  Exam reveals no gallop and no friction rub.   No murmur heard. Pulmonary/Chest: Effort normal and breath sounds normal. No stridor. No respiratory distress. He has no wheezes. He has  no rales. He exhibits no tenderness.  Abdominal: Soft. Bowel sounds are normal. He exhibits no distension and no mass. There is no tenderness. There is no rebound and no guarding.  Musculoskeletal: Normal range of motion. He exhibits no edema, tenderness or deformity.  Lymphadenopathy:    He has no cervical adenopathy.  Neurological: He is oriented to person, place, and time.  Skin: Skin is warm and dry. No rash noted. He is not diaphoretic. No erythema. No pallor.  Vitals reviewed.   Lab Results  Component Value Date   WBC 7.9 04/14/2016   HGB 13.8 04/14/2016   HCT 40.4 04/14/2016   PLT 178.0 04/14/2016   GLUCOSE 145 (H) 04/14/2016   CHOL 186 11/07/2015   TRIG 180.0 (H) 11/07/2015   HDL 34.70 (L) 11/07/2015   LDLCALC 115 (H) 11/07/2015   ALT 30 04/14/2016   AST 18 04/14/2016   NA 140 04/14/2016   K 4.0 04/14/2016   CL 103 04/14/2016   CREATININE 0.74 04/14/2016   BUN 17 04/14/2016   CO2 29 04/14/2016   TSH 1.08 04/14/2016   PSA 0.47 11/07/2015   HGBA1C 6.6 (H) 04/14/2016   MICROALBUR 2.6 (H) 04/14/2016    Dg Chest 2 View  Result Date: 11/07/2015 CLINICAL DATA:  Long history of smoking, no current complaints, previous episodes of bronchitis earlier this year, routine physical examination. EXAM: CHEST  2 VIEW COMPARISON:  Chest x-ray of June 21, 2015 FINDINGS: The lungs remain hyperinflated. The interstitial markings remain coarse bilaterally. There is no alveolar infiltrate or pleural effusion. The heart and pulmonary vascularity are normal. The trachea is midline. The bony thorax exhibits no acute abnormality. IMPRESSION: COPD and mild chronic interstitial prominence consistent with a smoking history. There is no acute cardiopulmonary abnormality. Electronically Signed   By: David  Martinique M.D.   On: 11/07/2015 09:42    Assessment & Plan:   [name] was seen today for diabetes.  Diagnoses and all orders for this visit:  OSA on CPAP- I've asked him to see sleep medicine to  see if another sleep study is indicated -     Ambulatory referral to Pulmonology -     modafinil (PROVIGIL) 100 MG tablet; Take 1 tablet (100 mg total) by mouth daily.  Type 2 diabetes mellitus with complication, without long-term current use of insulin ([place])- his A1c has a 6.6%, his blood sugars are well-controlled. -     Urinalysis, Routine w reflex microscopic (not at [facility]); Future -     Microalbumin / creatinine urine ratio; Future -     Comprehensive metabolic panel; Future -     Hemoglobin A1c; Future  Obesity (BMI 30.0-34.9)- he is working on his lifestyle modifications to lose weight  Thrombocytopenia ([place])- improvement noted, he has no signs of bleeding disorder -     CBC with Differential/Platelet; Future  Somnolence, daytime- will refer to sleep medicine for further evaluation but for now will start modafinil at 100 mg a day, he will try this dose for a couple weeks and may consider increasing to 200  mg a day. -     Ambulatory referral to Pulmonology -     modafinil (PROVIGIL) 100 MG tablet; Take 1 tablet (100 mg total) by mouth daily.  Fatigue, unspecified type- his labs do not indicate any metabolic causes for fatigue, will treat as indicated above. -     modafinil (PROVIGIL) 100 MG tablet; Take 1 tablet (100 mg total) by mouth daily. -     Thyroid Panel With TSH; Future -     Comprehensive metabolic panel; Future -     CBC with Differential/Platelet; Future  Need for prophylactic vaccination and inoculation against influenza -     Flu Vaccine QUAD 36+ mos IM   I have discontinued Jeff Norton zolpidem, varenicline, and amoxicillin-clavulanate. I am also having him start on modafinil. Additionally, I am having him maintain his LamoTRIgine (LAMICTAL PO), risperiDONE, fluticasone furoate-vilanterol, and Albuterol Sulfate.  Meds ordered this encounter  Medications  . modafinil (PROVIGIL) 100 MG tablet    Sig: Take 1 tablet (100 mg total) by mouth daily.    Dispense:   90 tablet    Refill:  1     Follow-up: Return in about 2 months (around 06/14/2016).  Scarlette Calico, MD

## 2016-04-14 NOTE — Patient Instructions (Signed)
Fatigue  Fatigue is feeling tired all of the time, a lack of energy, or a lack of motivation. Occasional or mild fatigue is often a normal response to activity or life in general. However, long-lasting (chronic) or extreme fatigue may indicate an underlying medical condition.  HOME CARE INSTRUCTIONS   Watch your fatigue for any changes. The following actions may help to lessen any discomfort you are feeling:  · Talk to your health care provider about how much sleep you need each night. Try to get the required amount every night.  · Take medicines only as directed by your health care provider.  · Eat a healthy and nutritious diet. Ask your health care provider if you need help changing your diet.  · Drink enough fluid to keep your urine clear or pale yellow.  · Practice ways of relaxing, such as yoga, meditation, massage therapy, or acupuncture.  · Exercise regularly.    · Change situations that cause you stress. Try to keep your work and personal routine reasonable.  · Do not abuse illegal drugs.  · Limit alcohol intake to no more than 1 drink per day for nonpregnant women and 2 drinks per day for men. One drink equals 12 ounces of beer, 5 ounces of wine, or 1½ ounces of hard liquor.  · Take a multivitamin, if directed by your health care provider.  SEEK MEDICAL CARE IF:   · Your fatigue does not get better.  · You have a fever.    · You have unintentional weight loss or gain.  · You have headaches.    · You have difficulty:      Falling asleep.    Sleeping throughout the night.  · You feel angry, guilty, anxious, or sad.     · You are unable to have a bowel movement (constipation).    · You skin is dry.     · Your legs or another part of your body is swollen.    SEEK IMMEDIATE MEDICAL CARE IF:   · You feel confused.    · Your vision is blurry.  · You feel faint or pass out.    · You have a severe headache.    · You have severe abdominal, pelvic, or back pain.    · You have chest pain, shortness of breath, or an  irregular or fast heartbeat.    · You are unable to urinate or you urinate less than normal.    · You develop abnormal bleeding, such as bleeding from the rectum, vagina, nose, lungs, or nipples.  · You vomit blood.     · You have thoughts about harming yourself or committing suicide.    · You are worried that you might harm someone else.       This information is not intended to replace advice given to you by your health care provider. Make sure you discuss any questions you have with your health care provider.     Document Released: 03/30/2007 Document Revised: 06/23/2014 Document Reviewed: 10/04/2013  Elsevier Interactive Patient Education ©2016 [employer] Inc[employer]

## 2016-04-15 ENCOUNTER — Encounter: Payer: Self-pay | Admitting: Internal Medicine

## 2016-04-19 ENCOUNTER — Encounter: Payer: Self-pay | Admitting: Internal Medicine

## 2016-04-29 DIAGNOSIS — G4733 Obstructive sleep apnea (adult) (pediatric): Secondary | ICD-10-CM | POA: Diagnosis not present

## 2016-05-02 ENCOUNTER — Encounter: Payer: Self-pay | Admitting: Pulmonary Disease

## 2016-05-09 ENCOUNTER — Encounter: Payer: Self-pay | Admitting: Pulmonary Disease

## 2016-05-09 DIAGNOSIS — G4733 Obstructive sleep apnea (adult) (pediatric): Secondary | ICD-10-CM

## 2016-05-09 DIAGNOSIS — Z9989 Dependence on other enabling machines and devices: Principal | ICD-10-CM

## 2016-05-09 NOTE — Telephone Encounter (Signed)
RA  Please Advise and see below  Pt. Emailed [place] stating Dr. [name] wants him to have a new sleep study.  I am always fatigued and he believes it's due to my sleep patterns/sleep apnea.

## 2016-05-10 NOTE — Telephone Encounter (Signed)
His CPAP download showed good results, so doubt CPAP issue Pl schedule CPAP titration study to check

## 2016-05-12 ENCOUNTER — Telehealth: Payer: Self-pay

## 2016-05-12 NOTE — Telephone Encounter (Signed)
PA initiated via [place] and APPROVED through 08/09/2016. Pt advised of same

## 2016-05-12 NOTE — Telephone Encounter (Signed)
E-mail sent back to patient explaining results. Pt also made aware that CPAP titration has been ordered.  .Nothing further needed.

## 2016-05-21 DIAGNOSIS — G4733 Obstructive sleep apnea (adult) (pediatric): Secondary | ICD-10-CM | POA: Diagnosis not present

## 2016-05-29 DIAGNOSIS — G4733 Obstructive sleep apnea (adult) (pediatric): Secondary | ICD-10-CM | POA: Diagnosis not present

## 2016-06-25 DIAGNOSIS — D225 Melanocytic nevi of trunk: Secondary | ICD-10-CM | POA: Diagnosis not present

## 2016-06-25 DIAGNOSIS — D2361 Other benign neoplasm of skin of right upper limb, including shoulder: Secondary | ICD-10-CM | POA: Diagnosis not present

## 2016-06-25 DIAGNOSIS — D1801 Hemangioma of skin and subcutaneous tissue: Secondary | ICD-10-CM | POA: Diagnosis not present

## 2016-06-25 DIAGNOSIS — C44311 Basal cell carcinoma of skin of nose: Secondary | ICD-10-CM | POA: Diagnosis not present

## 2016-06-25 DIAGNOSIS — L821 Other seborrheic keratosis: Secondary | ICD-10-CM | POA: Diagnosis not present

## 2016-06-29 DIAGNOSIS — G4733 Obstructive sleep apnea (adult) (pediatric): Secondary | ICD-10-CM | POA: Diagnosis not present

## 2016-07-02 ENCOUNTER — Encounter (HOSPITAL_BASED_OUTPATIENT_CLINIC_OR_DEPARTMENT_OTHER): Payer: Medicare Other

## 2016-07-04 ENCOUNTER — Ambulatory Visit (HOSPITAL_BASED_OUTPATIENT_CLINIC_OR_DEPARTMENT_OTHER): Payer: Medicare Other | Attending: Pulmonary Disease | Admitting: Pulmonary Disease

## 2016-07-04 DIAGNOSIS — Z9989 Dependence on other enabling machines and devices: Secondary | ICD-10-CM

## 2016-07-04 DIAGNOSIS — G4733 Obstructive sleep apnea (adult) (pediatric): Secondary | ICD-10-CM | POA: Insufficient documentation

## 2016-07-05 ENCOUNTER — Encounter: Payer: Self-pay | Admitting: Internal Medicine

## 2016-07-09 ENCOUNTER — Telehealth: Payer: Self-pay | Admitting: Pulmonary Disease

## 2016-07-09 DIAGNOSIS — G4730 Sleep apnea, unspecified: Secondary | ICD-10-CM | POA: Diagnosis not present

## 2016-07-09 NOTE — Telephone Encounter (Signed)
CPAP titration showed 14 cm requirement So current settings of auto 10-15 cm should be adequate

## 2016-07-09 NOTE — Procedures (Signed)
Patient Name: Jeff Norton, Jeff Norton Date: 07/04/2016 Gender: Male D.O.B: 27-Oct-1965 Age (years): 1 Referring Provider: Kara Mead MD, ABSM Height (inches): 69 Interpreting Physician: Kara Mead MD, ABSM Weight (lbs): 225 RPSGT: Madelon Lips BMI: 33 MRN: [id number] Neck Size: 19.25   CLINICAL INFORMATION The patient is referred for a CPAP titration to treat sleep apnea. PSG 03/2006 showed AHI 58/hour which was corrected by CPAP of 11 cm with a medium fullface mask. He is maintained on autoCPAP 10-15 cm  SLEEP STUDY TECHNIQUE As per the AASM Manual for the Scoring of Sleep and Associated Events v2.3 (April 2016) with a hypopnea requiring 4% desaturations.  The channels recorded and monitored were frontal, central and occipital EEG, electrooculogram (EOG), submentalis EMG (chin), nasal and oral airflow, thoracic and abdominal wall motion, anterior tibialis EMG, snore microphone, electrocardiogram, and pulse oximetry. Continuous positive airway pressure (CPAP) was initiated at the beginning of the study and titrated to treat sleep-disordered breathing.  RESPIRATORY PARAMETERS Optimal PAP Pressure (cm): 14 AHI at Optimal Pressure (/hr): 0.5 Overall Minimal O2 (%): 85.00 Supine % at Optimal Pressure (%): 100 Minimal O2 at Optimal Pressure (%): 87.0     SLEEP ARCHITECTURE The study was initiated at 10:53:29 PM and ended at 5:08:58 AM.  Sleep onset time was 4.3 minutes and the sleep efficiency was 96.1%. The total sleep time was 360.7 minutes.  The patient spent 11.42% of the night in stage N1 sleep, 76.66% in stage N2 sleep, 0.00% in stage N3 and 11.92% in REM.Stage REM latency was 154.0 minutes  Wake after sleep onset was 10.5. Alpha intrusion was absent. Supine sleep was 100.00%.  CARDIAC DATA The 2 lead EKG demonstrated sinus rhythm. The mean heart rate was 79.72 beats per minute. Other EKG findings include: PVCs.   LEG MOVEMENT DATA The total Periodic Limb Movements of Sleep  (PLMS) were 0. The PLMS index was 0.00. A PLMS index of <15 is considered normal in adults.  IMPRESSIONS - The optimal PAP pressure was 14 cm of water. - Central sleep apnea was not noted during this titration (CAI = 0.0/h). - Moderate oxygen desaturations were observed during this titration (min O2 = 85.00%). - The patient snored with Moderate snoring volume during this titration study. - 2-lead EKG demonstrated: PVCs - Clinically significant periodic limb movements were not noted during this study. Arousals associated with PLMs were rare.   DIAGNOSIS - Obstructive Sleep Apnea (327.23 [G47.33 ICD-10])   RECOMMENDATIONS - Trial of CPAP therapy on 14 cm H2O with a Medium size Fisher&Paykel Full Face Mask Simplus mask and heated humidification. - Avoid alcohol, sedatives and other CNS depressants that may worsen sleep apnea and disrupt normal sleep architecture. - Sleep hygiene should be reviewed to assess factors that may improve sleep quality. - Weight management and regular exercise should be initiated or continued. - Return to [facility] Center for re-evaluation after 4 weeks of therapy    Kara Mead MD Board Certified in [place]

## 2016-07-10 NOTE — Telephone Encounter (Signed)
Called patient to discuss Dr. Bari Mantis plan but patient was busy. Will call back later.

## 2016-07-15 NOTE — Telephone Encounter (Signed)
Spoke with patient about results. Pt verbalized understanding. No further questions.

## 2016-07-16 DIAGNOSIS — C44311 Basal cell carcinoma of skin of nose: Secondary | ICD-10-CM | POA: Diagnosis not present

## 2016-07-30 DIAGNOSIS — G4733 Obstructive sleep apnea (adult) (pediatric): Secondary | ICD-10-CM | POA: Diagnosis not present

## 2016-08-19 ENCOUNTER — Other Ambulatory Visit (INDEPENDENT_AMBULATORY_CARE_PROVIDER_SITE_OTHER): Payer: Medicare Other

## 2016-08-19 ENCOUNTER — Encounter: Payer: Self-pay | Admitting: Internal Medicine

## 2016-08-19 ENCOUNTER — Ambulatory Visit (INDEPENDENT_AMBULATORY_CARE_PROVIDER_SITE_OTHER): Payer: Medicare Other | Admitting: Internal Medicine

## 2016-08-19 VITALS — BP 126/88 | HR 98 | Temp 98.2°F | Resp 16 | Ht 69.0 in | Wt 233.0 lb

## 2016-08-19 DIAGNOSIS — G4710 Hypersomnia, unspecified: Secondary | ICD-10-CM | POA: Insufficient documentation

## 2016-08-19 DIAGNOSIS — E118 Type 2 diabetes mellitus with unspecified complications: Secondary | ICD-10-CM | POA: Diagnosis not present

## 2016-08-19 DIAGNOSIS — G4730 Sleep apnea, unspecified: Secondary | ICD-10-CM

## 2016-08-19 DIAGNOSIS — Z720 Tobacco use: Secondary | ICD-10-CM | POA: Diagnosis not present

## 2016-08-19 DIAGNOSIS — E785 Hyperlipidemia, unspecified: Secondary | ICD-10-CM

## 2016-08-19 LAB — LIPID PANEL
Cholesterol: 170 mg/dL (ref 0–200)
HDL: 36.6 mg/dL — ABNORMAL LOW (ref >39.00)
NonHDL: 133.75
Total CHOL/HDL Ratio: 5
Triglycerides: 260 mg/dL — ABNORMAL HIGH (ref 0.0–149.0)
VLDL: 52 mg/dL — ABNORMAL HIGH (ref 0.0–40.0)

## 2016-08-19 LAB — COMPREHENSIVE METABOLIC PANEL
ALT: 38 U/L (ref 0–53)
AST: 27 U/L (ref 0–37)
Albumin: 4.6 g/dL (ref 3.5–5.2)
Alkaline Phosphatase: 83 U/L (ref 39–117)
BUN: 11 mg/dL (ref 6–23)
CO2: 32 mEq/L (ref 19–32)
Calcium: 9.7 mg/dL (ref 8.4–10.5)
Chloride: 99 mEq/L (ref 96–112)
Creatinine, Ser: 0.96 mg/dL (ref 0.40–1.50)
GFR: 87.43 mL/min (ref >60.00)
Glucose, Bld: 192 mg/dL — ABNORMAL HIGH (ref 70–99)
Potassium: 4 mEq/L (ref 3.5–5.1)
Sodium: 136 mEq/L (ref 135–145)
Total Bilirubin: 0.7 mg/dL (ref 0.2–1.2)
Total Protein: 7.4 g/dL (ref 6.0–8.3)

## 2016-08-19 LAB — HEMOGLOBIN A1C: Hgb A1c MFr Bld: 6.8 % — ABNORMAL HIGH (ref 4.6–6.5)

## 2016-08-19 LAB — LDL CHOLESTEROL, DIRECT: Direct LDL: 102 mg/dL

## 2016-08-19 MED ORDER — NICOTINE 14 MG/24HR TD PT24: 14.0000 mg | MEDICATED_PATCH | Freq: Every day | TRANSDERMAL | 0 refills | Status: DC

## 2016-08-19 MED ORDER — MODAFINIL 200 MG PO TABS: 200.0000 mg | ORAL_TABLET | Freq: Every day | ORAL | 5 refills | Status: DC

## 2016-08-19 MED ORDER — ROSUVASTATIN CALCIUM 10 MG PO TABS: 10.0000 mg | ORAL_TABLET | Freq: Every day | ORAL | 3 refills | Status: DC

## 2016-08-19 MED ORDER — NICOTINE 21 MG/24HR TD PT24: 21.0000 mg | MEDICATED_PATCH | Freq: Every day | TRANSDERMAL | 0 refills | Status: DC

## 2016-08-19 MED ORDER — NICOTINE 7 MG/24HR TD PT24: 7.0000 mg | MEDICATED_PATCH | Freq: Every day | TRANSDERMAL | 0 refills | Status: DC

## 2016-08-19 MED ORDER — ASPIRIN EC 81 MG PO TBEC: 81.0000 mg | DELAYED_RELEASE_TABLET | Freq: Every day | ORAL | 3 refills | Status: DC

## 2016-08-19 NOTE — Patient Instructions (Signed)

## 2016-08-19 NOTE — Progress Notes (Signed)
Pre-visit discussion using our clinic review tool. No additional management support is needed unless otherwise documented below in the visit note.  

## 2016-08-19 NOTE — Progress Notes (Signed)
Subjective:  Patient ID: Jeff Norton, male    DOB: December 16, 1965  Age: 51 y.o. MRN: [id number]  CC: Sleep Apnea (follow up study, fatigue); Nicotine Dependence (wants to stop ); Hyperlipidemia; and Diabetes   HPI Jeff Norton presents for f/up - he wants to quit smoking and to use nicotine patches to achieve this, he did not have a good reaction to chantix previously. He has been diagnosed with OSA and is using CPAP but complains of severe daytime sleepiness and wants to increase his modafinil dose.  Outpatient Medications Prior to Visit  Medication Sig Dispense Refill  . Albuterol Sulfate (PROAIR RESPICLICK) [id number] (90 Base) MCG/ACT AEPB Inhale 2 puffs into the lungs 4 (four) times daily as needed. 1 each 11  . fluticasone furoate-vilanterol (BREO ELLIPTA) 200-25 MCG/INH AEPB Inhale 1 puff into the lungs daily. 30 each 11  . LamoTRIgine (LAMICTAL PO) Take 400 mg by mouth daily.     . risperiDONE (RISPERDAL) 2 MG tablet Take 2 mg by mouth 2 (two) times daily.     . modafinil (PROVIGIL) 100 MG tablet Take 1 tablet (100 mg total) by mouth daily. 90 tablet 1   No facility-administered medications prior to visit.     ROS Review of Systems  Constitutional: Positive for fatigue. Negative for appetite change, chills, diaphoresis and unexpected weight change.  HENT: Negative.   Eyes: Negative for visual disturbance.  Respiratory: Positive for apnea. Negative for cough, chest tightness, shortness of breath and wheezing.   Cardiovascular: Negative for chest pain, palpitations and leg swelling.  Gastrointestinal: Negative for abdominal pain, constipation, diarrhea, nausea and vomiting.  Endocrine: Negative.  Negative for cold intolerance, heat intolerance, polydipsia, polyphagia and polyuria.  Genitourinary: Negative.  Negative for decreased urine volume, difficulty urinating, dysuria, flank pain, hematuria and urgency.  Musculoskeletal: Negative.  Negative for back pain, myalgias and neck  pain.  Skin: Negative.  Negative for color change, pallor and rash.  Allergic/Immunologic: Negative.   Neurological: Negative.  Negative for dizziness, weakness and light-headedness.  Hematological: Negative for adenopathy. Does not bruise/bleed easily.  Psychiatric/Behavioral: Negative.     Objective:  BP 126/88   Pulse 98   Temp 98.2 F (36.8 C) (Oral)   Resp 16   Ht 5\' 9"  (1.753 m)   Wt 233 lb (105.7 kg)   SpO2 92%   BMI 34.41 kg/m   BP Readings from Last 3 Encounters:  08/19/16 126/88  04/14/16 130/74  03/25/16 102/62    Wt Readings from Last 3 Encounters:  08/19/16 233 lb (105.7 kg)  07/04/16 225 lb (102.1 kg)  04/14/16 229 lb (103.9 kg)    Physical Exam  Constitutional: He is oriented to person, place, and time. No distress.  HENT:  Mouth/Throat: Oropharynx is clear and moist. No oropharyngeal exudate.  Eyes: Conjunctivae are normal. Right eye exhibits no discharge. Left eye exhibits no discharge. No scleral icterus.  Neck: Normal range of motion. Neck supple. No JVD present. No tracheal deviation present. No thyromegaly present.  Cardiovascular: Normal rate, regular rhythm, normal heart sounds and intact distal pulses.  Exam reveals no gallop and no friction rub.   No murmur heard. Pulmonary/Chest: Effort normal and breath sounds normal. No stridor. No respiratory distress. He has no wheezes. He has no rales. He exhibits no tenderness.  Abdominal: Soft. Bowel sounds are normal. He exhibits no distension and no mass. There is no tenderness. There is no rebound and no guarding.  Musculoskeletal: Normal range of motion. He exhibits  no edema, tenderness or deformity.  Lymphadenopathy:    He has no cervical adenopathy.  Neurological: He is oriented to person, place, and time.  Skin: Skin is warm and dry. No rash noted. He is not diaphoretic. No erythema. No pallor.  Vitals reviewed.   Lab Results  Component Value Date   WBC 7.9 04/14/2016   HGB 13.8 04/14/2016     HCT 40.4 04/14/2016   PLT 178.0 04/14/2016   GLUCOSE 192 (H) 08/19/2016   CHOL 170 08/19/2016   TRIG 260.0 (H) 08/19/2016   HDL 36.60 (L) 08/19/2016   LDLDIRECT 102.0 08/19/2016   LDLCALC 115 (H) 11/07/2015   ALT 38 08/19/2016   AST 27 08/19/2016   NA 136 08/19/2016   K 4.0 08/19/2016   CL 99 08/19/2016   CREATININE 0.96 08/19/2016   BUN 11 08/19/2016   CO2 32 08/19/2016   TSH 1.08 04/14/2016   PSA 0.47 11/07/2015   HGBA1C 6.8 (H) 08/19/2016   MICROALBUR 2.6 (H) 04/14/2016    Dg Chest 2 View  Result Date: 11/07/2015 CLINICAL DATA:  Long history of smoking, no current complaints, previous episodes of bronchitis earlier this year, routine physical examination. EXAM: CHEST  2 VIEW COMPARISON:  Chest x-ray of June 21, 2015 FINDINGS: The lungs remain hyperinflated. The interstitial markings remain coarse bilaterally. There is no alveolar infiltrate or pleural effusion. The heart and pulmonary vascularity are normal. The trachea is midline. The bony thorax exhibits no acute abnormality. IMPRESSION: COPD and mild chronic interstitial prominence consistent with a smoking history. There is no acute cardiopulmonary abnormality. Electronically Signed   By: David  Martinique M.D.   On: 11/07/2015 09:42    Assessment & Plan:   Jeff Norton was seen today for sleep apnea, nicotine dependence, hyperlipidemia and diabetes.  Diagnoses and all orders for this visit:  Type 2 diabetes mellitus with complication, without long-term current use of insulin ([place])- His A1c is 6.8%, his blood sugars are adequately well-controlled. -     Comprehensive metabolic panel; Future -     Hemoglobin A1c; Future -     rosuvastatin (CRESTOR) 10 MG tablet; Take 1 tablet (10 mg total) by mouth daily.  Hyperlipidemia LDL goal <100- he has not achieved his LDL goal and has an elevated Framingham risk score so I have asked him to start taking a statin and aspirin for cardiovascular risk reduction. -     Comprehensive metabolic  panel; Future -     Lipid panel; Future -     rosuvastatin (CRESTOR) 10 MG tablet; Take 1 tablet (10 mg total) by mouth daily. -     aspirin EC 81 MG tablet; Take 1 tablet (81 mg total) by mouth daily.  Sleep apnea with hypersomnolence- will try an increased dose of modafinil -     modafinil (PROVIGIL) 200 MG tablet; Take 1 tablet (200 mg total) by mouth daily.  Tobacco abuse -     nicotine (NICODERM CQ) 7 mg/24hr patch; Place 1 patch (7 mg total) onto the skin daily. -     nicotine (NICODERM CQ) 14 mg/24hr patch; Place 1 patch (14 mg total) onto the skin daily. -     nicotine (NICODERM CQ) 21 mg/24hr patch; Place 1 patch (21 mg total) onto the skin daily.   I have discontinued Jeff Norton modafinil. I am also having him start on modafinil, nicotine, nicotine, nicotine, rosuvastatin, and aspirin EC. Additionally, I am having him maintain his LamoTRIgine (LAMICTAL PO), risperiDONE, fluticasone furoate-vilanterol, and Albuterol Sulfate.  Meds ordered this encounter  Medications  . modafinil (PROVIGIL) 200 MG tablet    Sig: Take 1 tablet (200 mg total) by mouth daily.    Dispense:  30 tablet    Refill:  5  . nicotine (NICODERM CQ) 7 mg/24hr patch    Sig: Place 1 patch (7 mg total) onto the skin daily.    Dispense:  28 patch    Refill:  0  . nicotine (NICODERM CQ) 14 mg/24hr patch    Sig: Place 1 patch (14 mg total) onto the skin daily.    Dispense:  28 patch    Refill:  0  . nicotine (NICODERM CQ) 21 mg/24hr patch    Sig: Place 1 patch (21 mg total) onto the skin daily.    Dispense:  28 patch    Refill:  0  . rosuvastatin (CRESTOR) 10 MG tablet    Sig: Take 1 tablet (10 mg total) by mouth daily.    Dispense:  90 tablet    Refill:  3  . aspirin EC 81 MG tablet    Sig: Take 1 tablet (81 mg total) by mouth daily.    Dispense:  90 tablet    Refill:  3     Follow-up: Return in about 6 months (around 02/19/2017).  Scarlette Calico, MD

## 2016-08-22 DIAGNOSIS — G4733 Obstructive sleep apnea (adult) (pediatric): Secondary | ICD-10-CM | POA: Diagnosis not present

## 2016-08-27 DIAGNOSIS — G4733 Obstructive sleep apnea (adult) (pediatric): Secondary | ICD-10-CM | POA: Diagnosis not present

## 2016-09-18 ENCOUNTER — Encounter: Payer: Self-pay | Admitting: Internal Medicine

## 2016-09-18 ENCOUNTER — Ambulatory Visit (INDEPENDENT_AMBULATORY_CARE_PROVIDER_SITE_OTHER): Payer: Medicare Other | Admitting: Internal Medicine

## 2016-09-18 VITALS — BP 138/88 | HR 80 | Temp 98.4°F | Resp 16 | Ht 69.0 in | Wt 226.6 lb

## 2016-09-18 DIAGNOSIS — J301 Allergic rhinitis due to pollen: Secondary | ICD-10-CM

## 2016-09-18 DIAGNOSIS — M4802 Spinal stenosis, cervical region: Secondary | ICD-10-CM | POA: Insufficient documentation

## 2016-09-18 DIAGNOSIS — M5412 Radiculopathy, cervical region: Secondary | ICD-10-CM | POA: Diagnosis not present

## 2016-09-18 MED ORDER — OXYCODONE HCL 5 MG PO TABS: 5.0000 mg | ORAL_TABLET | Freq: Four times a day (QID) | ORAL | 0 refills | Status: DC | PRN

## 2016-09-18 MED ORDER — MONTELUKAST SODIUM 10 MG PO TABS: 10.0000 mg | ORAL_TABLET | Freq: Every day | ORAL | 3 refills | Status: DC

## 2016-09-18 MED ORDER — TAPENTADOL HCL 75 MG PO TABS: 75.0000 mg | ORAL_TABLET | Freq: Four times a day (QID) | ORAL | 0 refills | Status: DC | PRN

## 2016-09-18 NOTE — Patient Instructions (Signed)
Allergic Rhinitis Allergic rhinitis is when the mucous membranes in the nose respond to allergens. Allergens are particles in the air that cause your body to have an allergic reaction. This causes you to release allergic antibodies. Through a chain of events, these eventually cause you to release histamine into the blood stream. Although meant to protect the body, it is this release of histamine that causes your discomfort, such as frequent sneezing, congestion, and an itchy, runny nose. What are the causes? Seasonal allergic rhinitis (hay fever) is caused by pollen allergens that may come from grasses, trees, and weeds. Year-round allergic rhinitis (perennial allergic rhinitis) is caused by allergens such as house dust mites, pet dander, and mold spores. What are the signs or symptoms?  Nasal stuffiness (congestion).  Itchy, runny nose with sneezing and tearing of the eyes. How is this diagnosed? Your health care provider can help you determine the allergen or allergens that trigger your symptoms. If you and your health care provider are unable to determine the allergen, skin or blood testing may be used. Your health care provider will diagnose your condition after taking your health history and performing a physical exam. Your health care provider may assess you for other related conditions, such as asthma, pink eye, or an ear infection. How is this treated? Allergic rhinitis does not have a cure, but it can be controlled by:  Medicines that block allergy symptoms. These may include allergy shots, nasal sprays, and oral antihistamines.  Avoiding the allergen. Hay fever may often be treated with antihistamines in pill or nasal spray forms. Antihistamines block the effects of histamine. There are over-the-counter medicines that may help with nasal congestion and swelling around the eyes. Check with your health care provider before taking or giving this medicine. If avoiding the allergen or the  medicine prescribed do not work, there are many new medicines your health care provider can prescribe. Stronger medicine may be used if initial measures are ineffective. Desensitizing injections can be used if medicine and avoidance does not work. Desensitization is when a patient is given ongoing shots until the body becomes less sensitive to the allergen. Make sure you follow up with your health care provider if problems continue. Follow these instructions at home: It is not possible to completely avoid allergens, but you can reduce your symptoms by taking steps to limit your exposure to them. It helps to know exactly what you are allergic to so that you can avoid your specific triggers. Contact a health care provider if:  You have a fever.  You develop a cough that does not stop easily (persistent).  You have shortness of breath.  You start wheezing.  Symptoms interfere with normal daily activities. This information is not intended to replace advice given to you by your health care provider. Make sure you discuss any questions you have with your health care provider. Document Released: 02/25/2001 Document Revised: 02/01/2016 Document Reviewed: 02/07/2013 Elsevier Interactive Patient Education  2017 [employer]  

## 2016-09-18 NOTE — Progress Notes (Signed)
Subjective:  Patient ID: Jeff Norton, male    DOB: September 22, 1965  Age: 51 y.o. MRN: [id number]  CC: Allergic Rhinitis  and Neck Pain   HPI Jeff Norton presents for A 1-2 year history of worsening right-sided neck pain, right upper extremity pain, intermittent right upper extremity weakness and numbness in his right thumb and index fingers. 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 He tells me the pain is aching, shooting, and stabbing and interferes with his ability to sleep and do activities during the day. He has tried hydrocodone, Tylenol, naproxen, Advil, and Motrin without getting any symptom relief.  Outpatient Medications Prior to Visit  Medication Sig Dispense Refill  . Albuterol Sulfate (PROAIR RESPICLICK) 073 (90 Base) MCG/ACT AEPB Inhale 2 puffs into the lungs 4 (four) times daily as needed. 1 each 11  . aspirin EC 81 MG tablet Take 1 tablet (81 mg total) by mouth daily. 90 tablet 3  . fluticasone furoate-vilanterol (BREO ELLIPTA) 200-25 MCG/INH AEPB Inhale 1 puff into the lungs daily. 30 each 11  . LamoTRIgine (LAMICTAL PO) Take 400 mg by mouth daily.     . modafinil (PROVIGIL) 200 MG tablet Take 1 tablet (200 mg total) by mouth daily. 30 tablet 5  . risperiDONE (RISPERDAL) 2 MG tablet Take 2 mg by mouth 2 (two) times daily.     . rosuvastatin (CRESTOR) 10 MG tablet Take 1 tablet (10 mg total) by mouth daily. 90 tablet 3  . nicotine (NICODERM CQ) 14 mg/24hr patch Place 1 patch (14 mg total) onto the skin daily. (Patient not taking: Reported on 09/18/2016) 28 patch 0  . nicotine (NICODERM CQ) 21 mg/24hr patch Place 1 patch (21 mg total) onto the skin daily. (Patient not taking: Reported on 09/18/2016) 28 patch 0  . nicotine (NICODERM CQ) 7 mg/24hr patch Place 1 patch (7 mg total) onto the skin daily. (Patient not taking: Reported on 09/18/2016) 28 patch 0   No facility-administered medications prior to visit.     ROS Review of Systems  Constitutional:  Negative for appetite change, fatigue and fever.  HENT: Positive for congestion, postnasal drip and rhinorrhea. Negative for sinus pain, sinus pressure, sore throat, tinnitus and trouble swallowing.   Eyes: Negative for visual disturbance.  Respiratory: Negative for cough, chest tightness, shortness of breath and wheezing.   Cardiovascular: Negative for chest pain, palpitations and leg swelling.  Gastrointestinal: Negative for abdominal pain, constipation and vomiting.  Endocrine: Negative.   Genitourinary: Negative.  Negative for difficulty urinating.  Musculoskeletal: Positive for neck pain and neck stiffness. Negative for arthralgias and myalgias.  Skin: Negative.   Allergic/Immunologic: Negative.   Neurological: Positive for weakness and numbness. Negative for dizziness, tremors and headaches.  Hematological: Negative for adenopathy. Does not bruise/bleed easily.  Psychiatric/Behavioral: Negative.     Objective:  BP 138/88 (BP Location: Left Arm, Patient Position: Sitting, Cuff Size: Normal)   Pulse 80   Temp 98.4 F (36.9 C) (Oral)   Resp 16   Ht 5\' 9"  (1.753 m)   Wt 226 lb 10 oz (102.8 kg)   SpO2 95%   BMI 33.47 kg/m   BP Readings from Last 3 Encounters:  09/18/16 138/88  08/19/16 126/88  04/14/16 130/74    Wt Readings from Last 3 Encounters:  09/18/16 226 lb 10 oz (102.8 kg)  08/19/16 233 lb (105.7 kg)  07/04/16 225 lb (102.1 kg)    Physical Exam  Constitutional: He is oriented to  person, place, and time. No distress.  HENT:  Nose: Mucosal edema and rhinorrhea present. No epistaxis. Right sinus exhibits no maxillary sinus tenderness and no frontal sinus tenderness. Left sinus exhibits no maxillary sinus tenderness and no frontal sinus tenderness.  Mouth/Throat: No oropharyngeal exudate.  Eyes: Conjunctivae are normal. Left eye exhibits no discharge. No scleral icterus.  Neck: Normal range of motion. Neck supple. No JVD present. No tracheal deviation present. No  thyromegaly present.  Cardiovascular: Normal rate, regular rhythm, normal heart sounds and intact distal pulses.  Exam reveals no gallop and no friction rub.   No murmur heard. Pulmonary/Chest: Effort normal and breath sounds normal. No respiratory distress. He has no wheezes. He has no rales. He exhibits no tenderness.  Abdominal: Soft. Bowel sounds are normal. He exhibits no distension and no mass. There is no tenderness. There is no rebound and no guarding.  Musculoskeletal: Normal range of motion. He exhibits no edema, tenderness or deformity.       Cervical back: Normal. He exhibits normal range of motion, no tenderness, no bony tenderness, no swelling, no deformity, no pain and no spasm.  Lymphadenopathy:    He has no cervical adenopathy.  Neurological: He is alert and oriented to person, place, and time. He has normal strength. He displays no atrophy, no tremor and normal reflexes. No cranial nerve deficit or sensory deficit. He exhibits normal muscle tone. He displays a negative Romberg sign. He displays no seizure activity. Coordination and gait normal. He displays no Babinski's sign on the right side. He displays no Babinski's sign on the left side.  Reflex Scores:      Tricep reflexes are 0 on the right side and 0 on the left side.      Bicep reflexes are 0 on the right side and 0 on the left side.      Brachioradialis reflexes are 0 on the right side and 0 on the left side.      Patellar reflexes are 0 on the right side and 0 on the left side.      Achilles reflexes are 0 on the right side and 0 on the left side. Skin: Skin is warm and dry. No rash noted. He is not diaphoretic. No erythema.  Vitals reviewed.   Lab Results  Component Value Date   WBC 7.9 04/14/2016   HGB 13.8 04/14/2016   HCT 40.4 04/14/2016   PLT 178.0 04/14/2016   GLUCOSE 192 (H) 08/19/2016   CHOL 170 08/19/2016   TRIG 260.0 (H) 08/19/2016   HDL 36.60 (L) 08/19/2016   LDLDIRECT 102.0 08/19/2016   LDLCALC  115 (H) 11/07/2015   ALT 38 08/19/2016   AST 27 08/19/2016   NA 136 08/19/2016   K 4.0 08/19/2016   CL 99 08/19/2016   CREATININE 0.96 08/19/2016   BUN 11 08/19/2016   CO2 32 08/19/2016   TSH 1.08 04/14/2016   PSA 0.47 11/07/2015   HGBA1C 6.8 (H) 08/19/2016   MICROALBUR 2.6 (H) 04/14/2016    Dg Chest 2 View  Result Date: 11/07/2015 CLINICAL DATA:  Long history of smoking, no current complaints, previous episodes of bronchitis earlier this year, routine physical examination. EXAM: CHEST  2 VIEW COMPARISON:  Chest x-ray of June 21, 2015 FINDINGS: The lungs remain hyperinflated. The interstitial markings remain coarse bilaterally. There is no alveolar infiltrate or pleural effusion. The heart and pulmonary vascularity are normal. The trachea is midline. The bony thorax exhibits no acute abnormality. IMPRESSION: COPD and mild  chronic interstitial prominence consistent with a smoking history. There is no acute cardiopulmonary abnormality. Electronically Signed   By: David  Martinique M.D.   On: 11/07/2015 09:42    Assessment & Plan:   Jeff Norton was seen today for allergic rhinitis  and neck pain.  Diagnoses and all orders for this visit:  Radiculitis of right cervical region- we'll try oxycodone for relief of the pain, he has hardware in his next we can't undergo an MRI therefore I have ordered a CT scan to see if he has a new disc herniation or complication from the prior fusion. -     CT CERVICAL SPINE WO CONTRAST; Future -     Discontinue: tapentadol HCl (NUCYNTA) 75 MG tablet; Take 1 tablet (75 mg total) by mouth every 6 (six) hours as needed. -     oxyCODONE (OXY IR/ROXICODONE) 5 MG immediate release tablet; Take 1 tablet (5 mg total) by mouth every 6 (six) hours as needed for severe pain.  Acute seasonal allergic rhinitis due to pollen- will add Singulair to his current treatment for nasal allergies that includes fluticasone and Zyrtec-D. -     Discontinue: montelukast (SINGULAIR) 10 MG  tablet; Take 1 tablet (10 mg total) by mouth at bedtime. -     montelukast (SINGULAIR) 10 MG tablet; Take 1 tablet (10 mg total) by mouth at bedtime.   I have discontinued Mr. Vonstein tapentadol HCl. I am also having him start on oxyCODONE. Additionally, I am having him maintain his LamoTRIgine (LAMICTAL PO), risperiDONE, fluticasone furoate-vilanterol, Albuterol Sulfate, modafinil, nicotine, nicotine, nicotine, rosuvastatin, aspirin EC, and montelukast.  Meds ordered this encounter  Medications  . DISCONTD: tapentadol HCl (NUCYNTA) 75 MG tablet    Sig: Take 1 tablet (75 mg total) by mouth every 6 (six) hours as needed.    Dispense:  75 tablet    Refill:  0  . DISCONTD: montelukast (SINGULAIR) 10 MG tablet    Sig: Take 1 tablet (10 mg total) by mouth at bedtime.    Dispense:  90 tablet    Refill:  3  . montelukast (SINGULAIR) 10 MG tablet    Sig: Take 1 tablet (10 mg total) by mouth at bedtime.    Dispense:  30 tablet    Refill:  3  . oxyCODONE (OXY IR/ROXICODONE) 5 MG immediate release tablet    Sig: Take 1 tablet (5 mg total) by mouth every 6 (six) hours as needed for severe pain.    Dispense:  75 tablet    Refill:  0     Follow-up: Return in about 3 months (around 12/18/2016).  Scarlette Calico, MD

## 2016-09-18 NOTE — Progress Notes (Signed)
Pre visit review using our clinic review tool, if applicable. No additional management support is needed unless otherwise documented below in the visit note. 

## 2016-09-22 ENCOUNTER — Other Ambulatory Visit: Payer: Self-pay | Admitting: Internal Medicine

## 2016-09-22 ENCOUNTER — Ambulatory Visit (INDEPENDENT_AMBULATORY_CARE_PROVIDER_SITE_OTHER)
Admission: RE | Admit: 2016-09-22 | Discharge: 2016-09-22 | Disposition: A | Discharge status: Home / Self Care | Payer: Medicare Other | Source: Ambulatory Visit | Attending: Internal Medicine | Admitting: Internal Medicine

## 2016-09-22 ENCOUNTER — Encounter: Payer: Self-pay | Admitting: Internal Medicine

## 2016-09-22 DIAGNOSIS — M5412 Radiculopathy, cervical region: Secondary | ICD-10-CM

## 2016-09-22 DIAGNOSIS — M542 Cervicalgia: Secondary | ICD-10-CM | POA: Diagnosis not present

## 2016-09-22 DIAGNOSIS — M4802 Spinal stenosis, cervical region: Secondary | ICD-10-CM

## 2016-09-22 DIAGNOSIS — M50322 Other cervical disc degeneration at C5-C6 level: Secondary | ICD-10-CM | POA: Diagnosis not present

## 2016-09-22 DIAGNOSIS — G4733 Obstructive sleep apnea (adult) (pediatric): Secondary | ICD-10-CM | POA: Diagnosis not present

## 2016-09-26 ENCOUNTER — Ambulatory Visit: Payer: Medicare Other | Admitting: Adult Health

## 2016-09-26 ENCOUNTER — Other Ambulatory Visit: Payer: Self-pay | Admitting: Internal Medicine

## 2016-09-26 ENCOUNTER — Telehealth: Payer: Self-pay | Admitting: Internal Medicine

## 2016-09-26 DIAGNOSIS — M4802 Spinal stenosis, cervical region: Secondary | ICD-10-CM

## 2016-09-26 MED ORDER — GABAPENTIN 100 MG PO CAPS: 100.0000 mg | ORAL_CAPSULE | Freq: Three times a day (TID) | ORAL | 3 refills | Status: DC

## 2016-09-26 NOTE — Telephone Encounter (Signed)
Okay with visit today already scheduled at other location.

## 2016-09-26 NOTE — Telephone Encounter (Signed)
Please advise as Dr [name] is out of the office.

## 2016-09-26 NOTE — Telephone Encounter (Signed)
Patient Name: Jeff Norton DOB: April 07, 1966 Initial Comment caller states he was prescribed pain killers by Dr. [name] an he is allergic to them . He is nauseas an threw up. He is also itching. Nurse Assessment Nurse: Ardine Bjork, RN, [place] Date/Time (Eastern Time): 09/26/2016 10:20:00 AM Confirm and document reason for call. If symptomatic, describe symptoms. ---Caller states he was prescribed pain killers(oxycodone 5mg  i po q 4-6 hrs prn pain)-rx by Dr. [name] an he is allergic to them . He is nauseas an threw up. He is also itching. Med rx'd for neck pain. Does the patient have any new or worsening symptoms? ---Yes Will a triage be completed? ---Yes Related visit to physician within the last 2 weeks? ---Yes Does the PT have any chronic conditions? (i.e. diabetes, asthma, etc.) ---Yes List chronic conditions. ---Elevated Trig-on Statin Is this a behavioral health or substance abuse call? ---No Guidelines Guideline Title Affirmed Question Affirmed Notes Itching - Widespread [1] MODERATE-SEVERE widespread itching (i.e., interferes with sleep, normal activities or school) AND [2] not improved after 24 hours of itching Care Advice Final Disposition User See Physician within [place], RN, [place] Comments Call to office regarding moderate itching r/t Oxycodone 5mg -appt will be needed per nurse. Appt made at 3:30pm today at [facility] Location with NP [facility] Referrals REFERRED TO PCP OFFICE

## 2016-09-27 DIAGNOSIS — G4733 Obstructive sleep apnea (adult) (pediatric): Secondary | ICD-10-CM | POA: Diagnosis not present

## 2016-09-30 DIAGNOSIS — M4722 Other spondylosis with radiculopathy, cervical region: Secondary | ICD-10-CM | POA: Diagnosis not present

## 2016-09-30 DIAGNOSIS — M542 Cervicalgia: Secondary | ICD-10-CM | POA: Diagnosis not present

## 2016-09-30 DIAGNOSIS — M5030 Other cervical disc degeneration, unspecified cervical region: Secondary | ICD-10-CM | POA: Diagnosis not present

## 2016-09-30 DIAGNOSIS — M5412 Radiculopathy, cervical region: Secondary | ICD-10-CM | POA: Diagnosis not present

## 2016-10-02 DIAGNOSIS — M5412 Radiculopathy, cervical region: Secondary | ICD-10-CM | POA: Diagnosis not present

## 2016-10-02 DIAGNOSIS — M542 Cervicalgia: Secondary | ICD-10-CM | POA: Diagnosis not present

## 2016-10-06 DIAGNOSIS — M5020 Other cervical disc displacement, unspecified cervical region: Secondary | ICD-10-CM | POA: Diagnosis not present

## 2016-10-06 DIAGNOSIS — M5412 Radiculopathy, cervical region: Secondary | ICD-10-CM | POA: Diagnosis not present

## 2016-10-06 DIAGNOSIS — M542 Cervicalgia: Secondary | ICD-10-CM | POA: Diagnosis not present

## 2016-10-06 DIAGNOSIS — M5030 Other cervical disc degeneration, unspecified cervical region: Secondary | ICD-10-CM | POA: Diagnosis not present

## 2016-10-06 DIAGNOSIS — M4722 Other spondylosis with radiculopathy, cervical region: Secondary | ICD-10-CM | POA: Diagnosis not present

## 2016-10-13 DIAGNOSIS — M47812 Spondylosis without myelopathy or radiculopathy, cervical region: Secondary | ICD-10-CM | POA: Diagnosis not present

## 2016-10-13 DIAGNOSIS — M50223 Other cervical disc displacement at C6-C7 level: Secondary | ICD-10-CM | POA: Diagnosis not present

## 2016-10-13 DIAGNOSIS — M50322 Other cervical disc degeneration at C5-C6 level: Secondary | ICD-10-CM | POA: Diagnosis not present

## 2016-10-13 DIAGNOSIS — M50222 Other cervical disc displacement at C5-C6 level: Secondary | ICD-10-CM | POA: Diagnosis not present

## 2016-10-22 DIAGNOSIS — G4733 Obstructive sleep apnea (adult) (pediatric): Secondary | ICD-10-CM | POA: Diagnosis not present

## 2016-10-27 DIAGNOSIS — G4733 Obstructive sleep apnea (adult) (pediatric): Secondary | ICD-10-CM | POA: Diagnosis not present

## 2016-11-04 DIAGNOSIS — M5020 Other cervical disc displacement, unspecified cervical region: Secondary | ICD-10-CM | POA: Diagnosis not present

## 2016-11-18 ENCOUNTER — Ambulatory Visit: Payer: Medicare Other | Admitting: Internal Medicine

## 2016-11-20 ENCOUNTER — Ambulatory Visit (INDEPENDENT_AMBULATORY_CARE_PROVIDER_SITE_OTHER): Payer: Medicare Other | Admitting: Internal Medicine

## 2016-11-20 ENCOUNTER — Encounter: Payer: Self-pay | Admitting: Internal Medicine

## 2016-11-20 VITALS — BP 132/80 | HR 80 | Temp 98.4°F | Resp 16 | Ht 69.0 in | Wt 227.0 lb

## 2016-11-20 DIAGNOSIS — F3176 Bipolar disorder, in full remission, most recent episode depressed: Secondary | ICD-10-CM | POA: Diagnosis not present

## 2016-11-20 DIAGNOSIS — J340 Abscess, furuncle and carbuncle of nose: Secondary | ICD-10-CM

## 2016-11-20 MED ORDER — SULFAMETHOXAZOLE-TRIMETHOPRIM 800-160 MG PO TABS: 1.0000 | ORAL_TABLET | Freq: Two times a day (BID) | ORAL | 0 refills | Status: AC

## 2016-11-20 MED ORDER — LAMOTRIGINE 200 MG PO TABS: 200.0000 mg | ORAL_TABLET | Freq: Two times a day (BID) | ORAL | 1 refills | Status: DC

## 2016-11-20 MED ORDER — RIFAMPIN 300 MG PO CAPS: 300.0000 mg | ORAL_CAPSULE | Freq: Two times a day (BID) | ORAL | 0 refills | Status: AC

## 2016-11-20 MED ORDER — RISPERIDONE 2 MG PO TABS: 2.0000 mg | ORAL_TABLET | Freq: Two times a day (BID) | ORAL | 1 refills | Status: DC

## 2016-11-20 NOTE — Progress Notes (Signed)
Subjective:  Patient ID: Jeff Norton, male    DOB: 07/17/1965  Age: 51 y.o. MRN: [id number]  CC: Facial Swelling   HPI Jeff Norton presents for Concerns about a 3 day history of redness, swelling, and drainage of blood and pus from the left side of his nose at the site of an excision of a basal cell carcinoma done by dermatology about 3 months ago.  He also wants me to prescribe his meds for bipolar disorder. He is doing very well on the current combination of gabapentin, Risperdal, and lamotrigine. He has been discharged from his previous psychiatrist at [facility] because of financial arrangements.  Outpatient Medications Prior to Visit  Medication Sig Dispense Refill  . Albuterol Sulfate (PROAIR RESPICLICK) 893 (90 Base) MCG/ACT AEPB Inhale 2 puffs into the lungs 4 (four) times daily as needed. 1 each 11  . aspirin EC 81 MG tablet Take 1 tablet (81 mg total) by mouth daily. 90 tablet 3  . fluticasone furoate-vilanterol (BREO ELLIPTA) 200-25 MCG/INH AEPB Inhale 1 puff into the lungs daily. 30 each 11  . gabapentin (NEURONTIN) 100 MG capsule Take 1 capsule (100 mg total) by mouth 3 (three) times daily. 90 capsule 3  . modafinil (PROVIGIL) 200 MG tablet Take 1 tablet (200 mg total) by mouth daily. 30 tablet 5  . montelukast (SINGULAIR) 10 MG tablet Take 1 tablet (10 mg total) by mouth at bedtime. 30 tablet 3  . rosuvastatin (CRESTOR) 10 MG tablet Take 1 tablet (10 mg total) by mouth daily. 90 tablet 3  . LamoTRIgine (LAMICTAL PO) Take 400 mg by mouth daily.     . nicotine (NICODERM CQ) 14 mg/24hr patch Place 1 patch (14 mg total) onto the skin daily. 28 patch 0  . nicotine (NICODERM CQ) 21 mg/24hr patch Place 1 patch (21 mg total) onto the skin daily. 28 patch 0  . nicotine (NICODERM CQ) 7 mg/24hr patch Place 1 patch (7 mg total) onto the skin daily. 28 patch 0  . risperiDONE (RISPERDAL) 2 MG tablet Take 2 mg by mouth 2 (two) times daily.      No facility-administered medications  prior to visit.     ROS Review of Systems  Constitutional: Negative.  Negative for appetite change, chills, fatigue and fever.  HENT: Positive for facial swelling. Negative for nosebleeds, sinus pain and sore throat.   Eyes: Negative for visual disturbance.  Respiratory: Negative.  Negative for cough, chest tightness and shortness of breath.   Cardiovascular: Negative.  Negative for chest pain, palpitations and leg swelling.  Gastrointestinal: Negative.  Negative for abdominal pain, constipation, diarrhea, nausea and vomiting.  Endocrine: Negative.   Genitourinary: Negative.  Negative for difficulty urinating.  Musculoskeletal: Negative.   Skin: Positive for color change.  Allergic/Immunologic: Negative.   Neurological: Negative.   Hematological: Negative for adenopathy. Does not bruise/bleed easily.  Psychiatric/Behavioral: Negative.     Objective:  BP 132/80 (BP Location: Left Arm, Patient Position: Sitting, Cuff Size: Normal)   Pulse 80   Temp 98.4 F (36.9 C) (Oral)   Resp 16   Ht 5\' 9"  (1.753 m)   Wt 227 lb (103 kg)   SpO2 99%   BMI 33.52 kg/m   BP Readings from Last 3 Encounters:  11/20/16 132/80  09/18/16 138/88  08/19/16 126/88    Wt Readings from Last 3 Encounters:  11/20/16 227 lb (103 kg)  09/18/16 226 lb 10 oz (102.8 kg)  08/19/16 233 lb (105.7 kg)  Physical Exam  Constitutional: He is oriented to person, place, and time.  Non-toxic appearance. He does not have a sickly appearance. He does not appear ill. No distress.  HENT:  Nose:    Mouth/Throat: No oropharyngeal exudate.  Neck: Neck supple.  Cardiovascular: Normal rate, regular rhythm and intact distal pulses.   No murmur heard. Pulmonary/Chest: Breath sounds normal. No respiratory distress. He has no rales.  Abdominal: Soft. Bowel sounds are normal. He exhibits no distension and no mass. There is no tenderness.  Musculoskeletal: Normal range of motion. He exhibits no tenderness or deformity.    Neurological: He is alert and oriented to person, place, and time.  Skin: No rash noted. He is not diaphoretic. There is erythema.  Psychiatric: He has a normal mood and affect. His behavior is normal. Judgment and thought content normal.  Vitals reviewed.   Lab Results  Component Value Date   WBC 7.9 04/14/2016   HGB 13.8 04/14/2016   HCT 40.4 04/14/2016   PLT 178.0 04/14/2016   GLUCOSE 192 (H) 08/19/2016   CHOL 170 08/19/2016   TRIG 260.0 (H) 08/19/2016   HDL 36.60 (L) 08/19/2016   LDLDIRECT 102.0 08/19/2016   LDLCALC 115 (H) 11/07/2015   ALT 38 08/19/2016   AST 27 08/19/2016   NA 136 08/19/2016   K 4.0 08/19/2016   CL 99 08/19/2016   CREATININE 0.96 08/19/2016   BUN 11 08/19/2016   CO2 32 08/19/2016   TSH 1.08 04/14/2016   PSA 0.47 11/07/2015   HGBA1C 6.8 (H) 08/19/2016   MICROALBUR 2.6 (H) 04/14/2016    Ct Cervical Spine Wo Contrast  Result Date: 09/22/2016 CLINICAL DATA:  Cervical radiculitis. Right cervical pain. Left upper extremity pain. EXAM: CT CERVICAL SPINE WITHOUT CONTRAST TECHNIQUE: Multidetector CT imaging of the cervical spine was performed without intravenous contrast. Multiplanar CT image reconstructions were also generated. COMPARISON:  03/28/2011 FINDINGS: Alignment: Normal. Skull base and vertebrae: No acute fracture. No primary bone lesion or focal pathologic process. Soft tissues and spinal canal: No prevertebral fluid or swelling. No visible canal hematoma. Disc levels: Degenerative disc disease with disc height loss at C5-6 and T1-2. Anterior cervical fusion at C6-7 with plate and screw fixation, without hardware failure or complication. Solid osseous bridging across the C6-7 disc space. Left uncovertebral degenerative changes at C4-5 with left foraminal encroachment. At C5-6 there is a broad-based disc bulge with bilateral uncovertebral degenerative changes and bilateral foraminal narrowing. At C6-7 there is interbody fusion without foraminal stenosis. At  C7-T1 there is right uncovertebral degenerative changes with moderate right foraminal stenosis. Upper chest: Lung apices are clear. Other: No fluid collection or hematoma. IMPRESSION: 1. Anterior cervical fusion at C6-7 with solid osseous fusion across the disc space. No foraminal stenosis. 2. Adjacent segment degenerative disc disease at C5-6 with bilateral uncovertebral degenerative changes and bilateral foraminal stenosis. 3. At C7-T1 there is right uncovertebral degenerative changes with moderate right foraminal stenosis. Electronically Signed   By: Kathreen Devoid   On: 09/22/2016 12:51    Assessment & Plan:   Jeff Norton was seen today for facial swelling.  Diagnoses and all orders for this visit:  Bipolar 1 disorder, depressed, full remission ([place])- he is doing well on the current regimen, will continue at the current doses. -     Discontinue: risperiDONE (RISPERDAL) 2 MG tablet; Take 1 tablet (2 mg total) by mouth 2 (two) times daily. -     lamoTRIgine (LAMICTAL) 200 MG tablet; Take 1 tablet (200 mg total)  by mouth 2 (two) times daily. -     risperiDONE (RISPERDAL) 2 MG tablet; Take 1 tablet (2 mg total) by mouth 2 (two) times daily.  Cellulitis of nasal tip- I am concerned that this is staph and possibly MRSA so I have asked him to take a course of Bactrim and rifampin to treat the infection and to eradicate any reservoir of staph such as the nasopharynx. -     sulfamethoxazole-trimethoprim (BACTRIM DS,SEPTRA DS) 800-160 MG tablet; Take 1 tablet by mouth 2 (two) times daily. -     rifampin (RIFADIN) 300 MG capsule; Take 1 capsule (300 mg total) by mouth 2 (two) times daily.   I have discontinued Jeff Norton nicotine, nicotine, and nicotine. I have also changed his LamoTRIgine (LAMICTAL PO) to lamoTRIgine (LAMICTAL) 200 MG tablet. Additionally, I am having him start on sulfamethoxazole-trimethoprim and rifampin. Lastly, I am having him maintain his fluticasone furoate-vilanterol, Albuterol  Sulfate, modafinil, rosuvastatin, aspirin EC, montelukast, gabapentin, cyclobenzaprine, diclofenac, and risperiDONE.  Meds ordered this encounter  Medications  . cyclobenzaprine (FLEXERIL) 10 MG tablet    Sig: TAKE 1/2-1 TABLET BY MOUTH TID AS NEEDED FOR MUSCLE SPASMS    Refill:  0  . diclofenac (VOLTAREN) 75 MG EC tablet    Sig: TK 1 T PO BID    Refill:  3  . DISCONTD: risperiDONE (RISPERDAL) 2 MG tablet    Sig: Take 1 tablet (2 mg total) by mouth 2 (two) times daily.    Dispense:  90 tablet    Refill:  1  . lamoTRIgine (LAMICTAL) 200 MG tablet    Sig: Take 1 tablet (200 mg total) by mouth 2 (two) times daily.    Dispense:  180 tablet    Refill:  1  . risperiDONE (RISPERDAL) 2 MG tablet    Sig: Take 1 tablet (2 mg total) by mouth 2 (two) times daily.    Dispense:  180 tablet    Refill:  1  . sulfamethoxazole-trimethoprim (BACTRIM DS,SEPTRA DS) 800-160 MG tablet    Sig: Take 1 tablet by mouth 2 (two) times daily.    Dispense:  20 tablet    Refill:  0  . rifampin (RIFADIN) 300 MG capsule    Sig: Take 1 capsule (300 mg total) by mouth 2 (two) times daily.    Dispense:  20 capsule    Refill:  0     Follow-up: Return in about 2 weeks (around 12/04/2016).  Scarlette Calico, MD

## 2016-11-20 NOTE — Patient Instructions (Signed)

## 2016-11-21 ENCOUNTER — Encounter: Payer: Self-pay | Admitting: Internal Medicine

## 2016-11-21 DIAGNOSIS — G4733 Obstructive sleep apnea (adult) (pediatric): Secondary | ICD-10-CM | POA: Diagnosis not present

## 2016-11-22 ENCOUNTER — Encounter: Payer: Self-pay | Admitting: Internal Medicine

## 2016-11-27 DIAGNOSIS — G4733 Obstructive sleep apnea (adult) (pediatric): Secondary | ICD-10-CM | POA: Diagnosis not present

## 2016-12-24 DIAGNOSIS — G4733 Obstructive sleep apnea (adult) (pediatric): Secondary | ICD-10-CM | POA: Diagnosis not present

## 2017-01-09 ENCOUNTER — Telehealth: Payer: Self-pay | Admitting: *Deleted

## 2017-01-09 ENCOUNTER — Ambulatory Visit (INDEPENDENT_AMBULATORY_CARE_PROVIDER_SITE_OTHER): Payer: Medicare Other | Admitting: *Deleted

## 2017-01-09 VITALS — BP 128/82 | HR 76 | Resp 20 | Ht 69.0 in | Wt 225.0 lb

## 2017-01-09 DIAGNOSIS — Z0000 Encounter for general adult medical examination without abnormal findings: Secondary | ICD-10-CM

## 2017-01-09 NOTE — Patient Instructions (Signed)
Continue doing brain stimulating activities (puzzles, reading, adult coloring books, staying active) to keep memory sharp.   Continue to eat heart healthy diet (full of fruits, vegetables, whole grains, lean protein, water--limit salt, fat, and sugar intake) and increase physical activity as tolerated.   Jeff Norton , Thank you for taking time to come for your Medicare Wellness Visit. I appreciate your ongoing commitment to your health goals. Please review the following plan we discussed and let me know if I can assist you in the future.   These are the goals we discussed: Goals    . lose weight below 200  pounds          I will exercise and eat healthy       This is a list of the screening recommended for you and due dates:  Health Maintenance  Topic Date Due  . Eye exam for diabetics  09/25/2016  . Flu Shot  01/14/2017  . Hemoglobin A1C  02/19/2017  . Complete foot exam   04/14/2017  . Urine Protein Check  04/14/2017  . Pneumococcal vaccine (2) 11/06/2020  . Tetanus Vaccine  07/24/2025  . Colon Cancer Screening  09/17/2025  . HIV Screening  Completed

## 2017-01-09 NOTE — Telephone Encounter (Signed)
During AWV, patient requested to have cyclobenzaprine refilled. He also wants to make sure that he will not have any problems with beginning to ride his bicycle as he noted that his last EKG was abnormal.

## 2017-01-09 NOTE — Progress Notes (Signed)
Subjective:   Jeff Norton is a 51 y.o. male who presents for an Initial Medicare Annual Wellness Visit.  Review of Systems  No ROS.  Medicare Wellness Visit. Additional risk factors are reflected in the social history.  Cardiac Risk Factors include: diabetes mellitus;dyslipidemia;male gender;smoking/ tobacco exposure Sleep patterns: feels rested on waking, gets up 1 times nightly to void and sleeps 6-7 hours nightly.    Home Safety/Smoke Alarms: Feels safe in home. Smoke alarms in place.  Living environment; residence and Firearm Safety: 1-story house/ trailer, no firearms. Seat Belt Safety/Bike Helmet: Wears seat belt.   Counseling:   Eye Exam- appointment yearly Dental- appointment yearly  Male:   CCS- Last 09/18/15, recall 10 years    PSA-  Lab Results  Component Value Date   PSA 0.47 11/07/2015       Objective:    Today's Vitals   01/09/17 1634  BP: 128/82  Pulse: 76  Resp: 20  SpO2: 98%  Weight: 225 lb (102.1 kg)  Height: 5\' 9"  (1.753 m)   Body mass index is 33.23 kg/m.  Current Medications (verified) Outpatient Encounter Prescriptions as of 01/09/2017  Medication Sig  . Albuterol Sulfate (PROAIR RESPICLICK) 791 (90 Base) MCG/ACT AEPB Inhale 2 puffs into the lungs 4 (four) times daily as needed.  Marland Kitchen aspirin EC 81 MG tablet Take 1 tablet (81 mg total) by mouth daily.  . cyclobenzaprine (FLEXERIL) 10 MG tablet TAKE 1/2-1 TABLET BY MOUTH TID AS NEEDED FOR MUSCLE SPASMS  . diclofenac (VOLTAREN) 75 MG EC tablet TK 1 T PO BID  . fluticasone furoate-vilanterol (BREO ELLIPTA) 200-25 MCG/INH AEPB Inhale 1 puff into the lungs daily.  Marland Kitchen gabapentin (NEURONTIN) 100 MG capsule Take 1 capsule (100 mg total) by mouth 3 (three) times daily.  Marland Kitchen lamoTRIgine (LAMICTAL) 200 MG tablet Take 1 tablet (200 mg total) by mouth 2 (two) times daily.  . modafinil (PROVIGIL) 200 MG tablet Take 1 tablet (200 mg total) by mouth daily.  . montelukast (SINGULAIR) 10 MG tablet Take 1 tablet  (10 mg total) by mouth at bedtime.  . risperiDONE (RISPERDAL) 2 MG tablet Take 1 tablet (2 mg total) by mouth 2 (two) times daily.  . rosuvastatin (CRESTOR) 10 MG tablet Take 1 tablet (10 mg total) by mouth daily.   No facility-administered encounter medications on file as of 01/09/2017.     Allergies (verified) Azithromycin and Cleocin [clindamycin hcl]   History: Past Medical History:  Diagnosis Date  . Allergy   . Anxiety   . Asthma   . Bipolar 1 disorder ([place])   . Depression   . GERD (gastroesophageal reflux disease)    Past Surgical History:  Procedure Laterality Date  . CHOLECYSTECTOMY    . CYST REMOVAL HAND  1997  . FRACTURE SURGERY    . GALLBLADDER SURGERY    . SPINE SURGERY     Family History  Problem Relation Age of Onset  . Hypertension Mother   . Mental illness Mother   . Diabetes Father   . Hyperlipidemia Father   . Depression Sister   . Diabetes Sister   . Hyperlipidemia Sister   . Cancer Maternal Grandmother   . Mental illness Maternal Grandmother    Social History   Occupational History  . Not on file.   Social History Main Topics  . Smoking status: Current Every Day Smoker    Packs/day: 1.00    Years: 30.00    Types: Cigarettes  . Smokeless tobacco: Never  Used  . Alcohol use 9.0 oz/week    15 Cans of beer per week  . Drug use: No  . Sexual activity: Not on file   Tobacco Counseling Ready to quit: Not Answered Counseling given: Not Answered   Activities of Daily Living In your present state of health, do you have any difficulty performing the following activities: 01/09/2017  Hearing? N  Vision? N  Difficulty concentrating or making decisions? N  Walking or climbing stairs? N  Dressing or bathing? N  Doing errands, shopping? N  Preparing Food and eating ? N  Using the Toilet? N  In the past six months, have you accidently leaked urine? N  Do you have problems with loss of bowel control? N  Managing your Medications? N  Managing  your Finances? N  Housekeeping or managing your Housekeeping? N  Some recent data might be hidden    Immunizations and Health Maintenance Immunization History  Administered Date(s) Administered  . Influenza,inj,Quad PF,36+ Mos 04/14/2016  . Influenza-Unspecified 08/11/2015  . Pneumococcal Polysaccharide-23 11/07/2015  . Tdap 07/25/2015   Health Maintenance Due  Topic Date Due  . OPHTHALMOLOGY EXAM  09/25/2016    Patient Care Team: [name], MD as PCP - General (Internal Medicine)  Indicate any recent Medical Services you may have received from other than [facility] providers in the past year (date may be approximate).    Assessment:   This is a routine wellness examination for Jeff Norton. Physical assessment deferred to PCP.   Hearing/Vision screen Hearing Screening Comments: Able to hear conversational tones w/o difficulty. No issues reported.  Passed whisper test Vision Screening Comments: Wears glasses  Dietary issues and exercise activities discussed: Current Exercise Habits: Home exercise routine, Time (Minutes): 45, Frequency (Times/Week): 3, Weekly Exercise (Minutes/Week): 135, Intensity: Moderate, Exercise limited by: None identified  Diet (meal preparation, eat out, water intake, caffeinated beverages, dairy products, fruits and vegetables): in general, a "healthy" diet  , well balanced, eats a variety of fruits and vegetables daily, limits salt, fat/cholesterol, sugar, caffeine, drinks 6-8 glasses of water daily.   Goals    . lose weight below 200  pounds          I will exercise and eat healthy      Depression Screen PHQ 2/9 Scores 01/09/2017 07/25/2015 06/21/2015  PHQ - 2 Score 2 0 1  PHQ- 9 Score 2 - -    Fall Risk Fall Risk  01/09/2017  Falls in the past year? No    Cognitive Function:       Ad8 score reviewed for issues:  Issues making decisions: no  Less interest in hobbies / activities: no  Repeats questions, stories (family complaining):  no  Trouble using ordinary gadgets (microwave, computer, phone):no  Forgets the month or year: no  Mismanaging finances: no  Remembering appts: no  Daily problems with thinking and/or memory: no Ad8 score is= 0  Screening Tests Health Maintenance  Topic Date Due  . OPHTHALMOLOGY EXAM  09/25/2016  . INFLUENZA VACCINE  01/14/2017  . HEMOGLOBIN A1C  02/19/2017  . FOOT EXAM  04/14/2017  . URINE MICROALBUMIN  04/14/2017  . PNEUMOCOCCAL POLYSACCHARIDE VACCINE (2) 11/06/2020  . TETANUS/TDAP  07/24/2025  . COLONOSCOPY  09/17/2025  . HIV Screening  Completed        Plan:    Continue doing brain stimulating activities (puzzles, reading, adult coloring books, staying active) to keep memory sharp.   Continue to eat heart healthy diet (full of fruits,  vegetables, whole grains, lean protein, water--limit salt, fat, and sugar intake) and increase physical activity as tolerated.  I have personally reviewed and noted the following in the patient's chart:   . Medical and social history . Use of alcohol, tobacco or illicit drugs  . Current medications and supplements . Functional ability and status . Nutritional status . Physical activity . Advanced directives . List of other physicians . Vitals . Screenings to include cognitive, depression, and falls . Referrals and appointments  In addition, I have reviewed and discussed with patient certain preventive protocols, quality metrics, and best practice recommendations. A written personalized care plan for preventive services as well as general preventive health recommendations were provided to patient.     Michiel Cowboy, RN   01/09/2017

## 2017-01-09 NOTE — Progress Notes (Signed)
Pre visit review using our clinic review tool, if applicable. No additional management support is needed unless otherwise documented below in the visit note. 

## 2017-01-11 ENCOUNTER — Other Ambulatory Visit: Payer: Self-pay | Admitting: Internal Medicine

## 2017-01-11 NOTE — Telephone Encounter (Signed)
Why does he take cyclobenzaprine?

## 2017-01-12 ENCOUNTER — Other Ambulatory Visit: Payer: Self-pay | Admitting: Internal Medicine

## 2017-01-12 DIAGNOSIS — M4802 Spinal stenosis, cervical region: Secondary | ICD-10-CM

## 2017-01-12 MED ORDER — CYCLOBENZAPRINE HCL 10 MG PO TABS: 10.0000 mg | ORAL_TABLET | Freq: Three times a day (TID) | ORAL | 3 refills | Status: DC | PRN

## 2017-01-12 NOTE — Telephone Encounter (Signed)
Spinal stenosis cervical region

## 2017-01-13 NOTE — Progress Notes (Signed)
Medical screening examination/treatment/procedure(s) were performed by he Welness Coach, RN. As primary care provider I was immediately available for consulation/collaboration. I agree with above documentation. Charlotte Nche, AGNP-C 

## 2017-01-23 DIAGNOSIS — G4733 Obstructive sleep apnea (adult) (pediatric): Secondary | ICD-10-CM | POA: Diagnosis not present

## 2017-02-08 ENCOUNTER — Other Ambulatory Visit: Payer: Self-pay | Admitting: Internal Medicine

## 2017-02-08 DIAGNOSIS — J4520 Mild intermittent asthma, uncomplicated: Secondary | ICD-10-CM

## 2017-02-08 DIAGNOSIS — J449 Chronic obstructive pulmonary disease, unspecified: Secondary | ICD-10-CM

## 2017-02-14 ENCOUNTER — Other Ambulatory Visit: Payer: Self-pay | Admitting: Internal Medicine

## 2017-02-14 DIAGNOSIS — J4520 Mild intermittent asthma, uncomplicated: Secondary | ICD-10-CM

## 2017-02-19 ENCOUNTER — Encounter: Payer: Self-pay | Admitting: Internal Medicine

## 2017-02-19 ENCOUNTER — Other Ambulatory Visit (INDEPENDENT_AMBULATORY_CARE_PROVIDER_SITE_OTHER): Payer: Medicare Other

## 2017-02-19 ENCOUNTER — Ambulatory Visit (INDEPENDENT_AMBULATORY_CARE_PROVIDER_SITE_OTHER): Payer: Medicare Other | Admitting: Internal Medicine

## 2017-02-19 VITALS — BP 124/70 | HR 63 | Temp 97.9°F | Resp 16 | Ht 69.0 in | Wt 227.2 lb

## 2017-02-19 DIAGNOSIS — J449 Chronic obstructive pulmonary disease, unspecified: Secondary | ICD-10-CM | POA: Diagnosis not present

## 2017-02-19 DIAGNOSIS — E118 Type 2 diabetes mellitus with unspecified complications: Secondary | ICD-10-CM

## 2017-02-19 DIAGNOSIS — E669 Obesity, unspecified: Secondary | ICD-10-CM

## 2017-02-19 DIAGNOSIS — Z23 Encounter for immunization: Secondary | ICD-10-CM

## 2017-02-19 LAB — BASIC METABOLIC PANEL
BUN: 13 mg/dL (ref 6–23)
CO2: 27 mEq/L (ref 19–32)
Calcium: 9.9 mg/dL (ref 8.4–10.5)
Chloride: 106 mEq/L (ref 96–112)
Creatinine, Ser: 0.73 mg/dL (ref 0.40–1.50)
GFR: 119.7 mL/min (ref >60.00)
Glucose, Bld: 139 mg/dL — ABNORMAL HIGH (ref 70–99)
Potassium: 4.6 mEq/L (ref 3.5–5.1)
Sodium: 141 mEq/L (ref 135–145)

## 2017-02-19 LAB — MICROALBUMIN / CREATININE URINE RATIO
Creatinine,U: 145.4 mg/dL
Microalb Creat Ratio: 3.3 mg/g (ref 0.0–30.0)
Microalb, Ur: 4.8 mg/dL — ABNORMAL HIGH (ref 0.0–1.9)

## 2017-02-19 LAB — HEMOGLOBIN A1C: Hgb A1c MFr Bld: 6 % (ref 4.6–6.5)

## 2017-02-19 NOTE — Progress Notes (Signed)
Subjective:  Patient ID: Jeff Norton, male    DOB: 10/20/1965  Age: 51 y.o. MRN: [id number]  CC: Diabetes and COPD   HPI Jeff Norton presents for f/up - he has had no recent episodes of cough or wheezing. He tells me his blood sugar has been well controlled. He feels well today and offers no complaints.  Outpatient Medications Prior to Visit  Medication Sig Dispense Refill  . aspirin EC 81 MG tablet Take 1 tablet (81 mg total) by mouth daily. 90 tablet 3  . BREO ELLIPTA 200-25 MCG/INH AEPB INHALE 1 PUFF INTO THE LUNGS DAILY 90 each 1  . cyclobenzaprine (FLEXERIL) 10 MG tablet Take 1 tablet (10 mg total) by mouth 3 (three) times daily as needed for muscle spasms. 45 tablet 3  . diclofenac (VOLTAREN) 75 MG EC tablet TK 1 T PO BID  3  . lamoTRIgine (LAMICTAL) 200 MG tablet Take 1 tablet (200 mg total) by mouth 2 (two) times daily. 180 tablet 1  . modafinil (PROVIGIL) 200 MG tablet Take 1 tablet (200 mg total) by mouth daily. 30 tablet 5  . montelukast (SINGULAIR) 10 MG tablet Take 1 tablet (10 mg total) by mouth at bedtime. 30 tablet 3  . PROAIR RESPICLICK 941 (90 Base) MCG/ACT AEPB INHALE 2 PUFFS INTO THE LUNGS FOUR TIMES DAILY AS NEEDED 1 each 11  . risperiDONE (RISPERDAL) 2 MG tablet Take 1 tablet (2 mg total) by mouth 2 (two) times daily. 180 tablet 1  . rosuvastatin (CRESTOR) 10 MG tablet Take 1 tablet (10 mg total) by mouth daily. 90 tablet 3  . gabapentin (NEURONTIN) 100 MG capsule Take 1 capsule (100 mg total) by mouth 3 (three) times daily. 90 capsule 3   No facility-administered medications prior to visit.     ROS Review of Systems  Constitutional: Negative.  Negative for chills, fatigue and fever.  HENT: Negative.  Negative for sinus pressure, sore throat and trouble swallowing.   Eyes: Negative.   Respiratory: Negative.  Negative for cough, chest tightness, shortness of breath and wheezing.   Cardiovascular: Negative for chest pain, palpitations and leg swelling.    Gastrointestinal: Negative for abdominal pain, constipation, diarrhea, nausea and vomiting.  Endocrine: Negative.   Genitourinary: Negative.  Negative for difficulty urinating.  Musculoskeletal: Negative.  Negative for back pain and myalgias.  Skin: Negative.  Negative for color change.  Allergic/Immunologic: Negative.   Neurological: Negative.   Hematological: Negative for adenopathy. Does not bruise/bleed easily.  Psychiatric/Behavioral: Negative.     Objective:  BP 124/70 (BP Location: Left Arm, Patient Position: Sitting, Cuff Size: Normal)   Pulse 63   Temp 97.9 F (36.6 C) (Oral)   Resp 16   Ht 5\' 9"  (1.753 m)   Wt 227 lb 4 oz (103.1 kg)   SpO2 98%   BMI 33.56 kg/m   BP Readings from Last 3 Encounters:  02/19/17 124/70  01/09/17 128/82  11/20/16 132/80    Wt Readings from Last 3 Encounters:  02/19/17 227 lb 4 oz (103.1 kg)  01/09/17 225 lb (102.1 kg)  11/20/16 227 lb (103 kg)    Physical Exam  Constitutional: He is oriented to person, place, and time. No distress.  HENT:  Mouth/Throat: Oropharynx is clear and moist. No oropharyngeal exudate.  Eyes: Conjunctivae are normal. Right eye exhibits no discharge. Left eye exhibits no discharge. No scleral icterus.  Neck: Normal range of motion. Neck supple. No JVD present. No thyromegaly present.  Cardiovascular: Normal  rate, regular rhythm and intact distal pulses.  Exam reveals no gallop and no friction rub.   No murmur heard. Pulmonary/Chest: Effort normal and breath sounds normal. No respiratory distress. He has no wheezes. He has no rales. He exhibits no tenderness.  Abdominal: Soft. Bowel sounds are normal. He exhibits no distension and no mass. There is no tenderness. There is no rebound and no guarding.  Musculoskeletal: Normal range of motion. He exhibits no edema, tenderness or deformity.  Lymphadenopathy:    He has no cervical adenopathy.  Neurological: He is alert and oriented to person, place, and time.   Skin: Skin is warm and dry. No rash noted. He is not diaphoretic. No erythema. No pallor.  Vitals reviewed.   Lab Results  Component Value Date   WBC 7.9 04/14/2016   HGB 13.8 04/14/2016   HCT 40.4 04/14/2016   PLT 178.0 04/14/2016   GLUCOSE 139 (H) 02/19/2017   CHOL 170 08/19/2016   TRIG 260.0 (H) 08/19/2016   HDL 36.60 (L) 08/19/2016   LDLDIRECT 102.0 08/19/2016   LDLCALC 115 (H) 11/07/2015   ALT 38 08/19/2016   AST 27 08/19/2016   NA 141 02/19/2017   K 4.6 02/19/2017   CL 106 02/19/2017   CREATININE 0.73 02/19/2017   BUN 13 02/19/2017   CO2 27 02/19/2017   TSH 1.08 04/14/2016   PSA 0.47 11/07/2015   HGBA1C 6.0 02/19/2017   MICROALBUR 4.8 (H) 02/19/2017    Ct Cervical Spine Wo Contrast  Result Date: 09/22/2016 CLINICAL DATA:  Cervical radiculitis. Right cervical pain. Left upper extremity pain. EXAM: CT CERVICAL SPINE WITHOUT CONTRAST TECHNIQUE: Multidetector CT imaging of the cervical spine was performed without intravenous contrast. Multiplanar CT image reconstructions were also generated. COMPARISON:  03/28/2011 FINDINGS: Alignment: Normal. Skull base and vertebrae: No acute fracture. No primary bone lesion or focal pathologic process. Soft tissues and spinal canal: No prevertebral fluid or swelling. No visible canal hematoma. Disc levels: Degenerative disc disease with disc height loss at C5-6 and T1-2. Anterior cervical fusion at C6-7 with plate and screw fixation, without hardware failure or complication. Solid osseous bridging across the C6-7 disc space. Left uncovertebral degenerative changes at C4-5 with left foraminal encroachment. At C5-6 there is a broad-based disc bulge with bilateral uncovertebral degenerative changes and bilateral foraminal narrowing. At C6-7 there is interbody fusion without foraminal stenosis. At C7-T1 there is right uncovertebral degenerative changes with moderate right foraminal stenosis. Upper chest: Lung apices are clear. Other: No fluid  collection or hematoma. IMPRESSION: 1. Anterior cervical fusion at C6-7 with solid osseous fusion across the disc space. No foraminal stenosis. 2. Adjacent segment degenerative disc disease at C5-6 with bilateral uncovertebral degenerative changes and bilateral foraminal stenosis. 3. At C7-T1 there is right uncovertebral degenerative changes with moderate right foraminal stenosis. Electronically Signed   By: Kathreen Devoid   On: 09/22/2016 12:51    Assessment & Plan:   Jeff Norton was seen today for diabetes and copd.  Diagnoses and all orders for this visit:  Obesity (BMI 30.0-34.9)- he is working on his lifestyle modifications to lose weight.  Type 2 diabetes mellitus with complication, without long-term current use of insulin ([place])- his A1c is down to 6% on no agents. His blood sugar is adequately well controlled. -     Basic metabolic panel; Future -     Hemoglobin A1c; Future -     Microalbumin / creatinine urine ratio; Future -     Ambulatory referral to Ophthalmology  Need  for influenza vaccination -     Flu Vaccine QUAD 36+ mos IM  COPD with asthma ([place])- his symptoms are well controlled and he has had no recent exacerbations. Will continue the LABA/ICS combination inhaler.   I have discontinued Jeff Norton gabapentin. I am also having him maintain his modafinil, rosuvastatin, aspirin EC, montelukast, diclofenac, lamoTRIgine, risperiDONE, cyclobenzaprine, PROAIR RESPICLICK, and BREO ELLIPTA.  No orders of the defined types were placed in this encounter.    Follow-up: Return in about 6 months (around 08/19/2017).  Scarlette Calico, MD

## 2017-02-19 NOTE — Patient Instructions (Signed)

## 2017-02-23 ENCOUNTER — Other Ambulatory Visit: Payer: Self-pay | Admitting: Internal Medicine

## 2017-02-23 DIAGNOSIS — G4733 Obstructive sleep apnea (adult) (pediatric): Secondary | ICD-10-CM | POA: Diagnosis not present

## 2017-02-23 DIAGNOSIS — G4730 Sleep apnea, unspecified: Principal | ICD-10-CM

## 2017-02-23 DIAGNOSIS — G4710 Hypersomnia, unspecified: Secondary | ICD-10-CM

## 2017-03-13 DIAGNOSIS — M4722 Other spondylosis with radiculopathy, cervical region: Secondary | ICD-10-CM | POA: Diagnosis not present

## 2017-03-13 DIAGNOSIS — Z981 Arthrodesis status: Secondary | ICD-10-CM | POA: Diagnosis not present

## 2017-03-13 DIAGNOSIS — M5030 Other cervical disc degeneration, unspecified cervical region: Secondary | ICD-10-CM | POA: Diagnosis not present

## 2017-03-14 ENCOUNTER — Ambulatory Visit (INDEPENDENT_AMBULATORY_CARE_PROVIDER_SITE_OTHER): Payer: Medicare Other | Admitting: Family Medicine

## 2017-03-14 ENCOUNTER — Encounter: Payer: Self-pay | Admitting: Family Medicine

## 2017-03-14 VITALS — BP 120/78 | HR 73 | Temp 97.6°F | Resp 12 | Wt 221.4 lb

## 2017-03-14 DIAGNOSIS — Z720 Tobacco use: Secondary | ICD-10-CM | POA: Diagnosis not present

## 2017-03-14 DIAGNOSIS — J988 Other specified respiratory disorders: Secondary | ICD-10-CM | POA: Diagnosis not present

## 2017-03-14 DIAGNOSIS — J45901 Unspecified asthma with (acute) exacerbation: Secondary | ICD-10-CM | POA: Diagnosis not present

## 2017-03-14 MED ORDER — PREDNISONE 20 MG PO TABS: 40.0000 mg | ORAL_TABLET | Freq: Every day | ORAL | 0 refills | Status: AC

## 2017-03-14 MED ORDER — DOXYCYCLINE HYCLATE 100 MG PO TABS: 100.0000 mg | ORAL_TABLET | Freq: Two times a day (BID) | ORAL | 0 refills | Status: AC

## 2017-03-14 MED ORDER — IPRATROPIUM-ALBUTEROL 0.5-2.5 (3) MG/3ML IN SOLN
3.0000 mL | Freq: Once | RESPIRATORY_TRACT | Status: AC
Start: 2017-03-14 — End: 2017-03-14
  Administered 2017-03-14: 3 mL via RESPIRATORY_TRACT

## 2017-03-14 NOTE — Progress Notes (Signed)
ACUTE VISIT  HPI:  Chief Complaint  Patient presents with  . Nasal Congestion    cough x 3 weeks    Jeff Norton is a 51 y.o.male here today complaining of 3 weeks of respiratory symptoms.  "Cough spasms", wheezing,and dyspnea at rest "all the time."  Productive cough with clear sputum.  Cough  This is a new problem. The current episode started 1 to 4 weeks ago. The problem has been unchanged. The problem occurs constantly. The cough is productive of sputum. Associated symptoms include ear congestion, nasal congestion, postnasal drip, rhinorrhea, shortness of breath and wheezing. Pertinent negatives include no chest pain, chills, ear pain, fever, headaches, heartburn, hemoptysis, myalgias, rash or sore throat. The symptoms are aggravated by exercise. Risk factors for lung disease include smoking/tobacco exposure. He has tried a beta-agonist inhaler for the symptoms. The treatment provided mild relief. His past medical history is significant for asthma and environmental allergies.   No Hx of recent travel. + Sick contact, his girlfriend. No known insect bite.  Hx of allergies: Yes. Hx of asthma and tobacco use.Used Albuterol inh a few hours ago. He is also on Breo 200-25 mcg daily. OTC medications for this problem: Mucinex.   Review of Systems  Constitutional: Positive for fatigue. Negative for appetite change, chills and fever.  HENT: Positive for congestion, postnasal drip and rhinorrhea. Negative for ear pain, mouth sores, sore throat and trouble swallowing.   Respiratory: Positive for cough, shortness of breath and wheezing. Negative for hemoptysis.   Cardiovascular: Negative for chest pain and leg swelling.  Gastrointestinal: Negative for abdominal pain, heartburn, nausea and vomiting.  Musculoskeletal: Negative for gait problem and myalgias.  Skin: Negative for rash.  Allergic/Immunologic: Positive for environmental allergies.  Neurological: Negative for  syncope, weakness and headaches.  Hematological: Negative for adenopathy. Does not bruise/bleed easily.  Psychiatric/Behavioral: Positive for sleep disturbance. Negative for confusion. The patient is nervous/anxious.      Current Outpatient Prescriptions on File Prior to Visit  Medication Sig Dispense Refill  . aspirin EC 81 MG tablet Take 1 tablet (81 mg total) by mouth daily. 90 tablet 3  . BREO ELLIPTA 200-25 MCG/INH AEPB INHALE 1 PUFF INTO THE LUNGS DAILY 90 each 1  . cyclobenzaprine (FLEXERIL) 10 MG tablet Take 1 tablet (10 mg total) by mouth 3 (three) times daily as needed for muscle spasms. 45 tablet 3  . diclofenac (VOLTAREN) 75 MG EC tablet TK 1 T PO BID  3  . lamoTRIgine (LAMICTAL) 200 MG tablet Take 1 tablet (200 mg total) by mouth 2 (two) times daily. 180 tablet 1  . modafinil (PROVIGIL) 200 MG tablet TAKE ONE TABLET BY MOUTH ONE TIME DAILY  30 tablet 4  . montelukast (SINGULAIR) 10 MG tablet Take 1 tablet (10 mg total) by mouth at bedtime. 30 tablet 3  . PROAIR RESPICLICK 034 (90 Base) MCG/ACT AEPB INHALE 2 PUFFS INTO THE LUNGS FOUR TIMES DAILY AS NEEDED 1 each 11  . risperiDONE (RISPERDAL) 2 MG tablet Take 1 tablet (2 mg total) by mouth 2 (two) times daily. 180 tablet 1  . rosuvastatin (CRESTOR) 10 MG tablet Take 1 tablet (10 mg total) by mouth daily. 90 tablet 3   No current facility-administered medications on file prior to visit.      Past Medical History:  Diagnosis Date  . Allergy   . Anxiety   . Asthma   . Bipolar 1 disorder ([place])   . Depression   .  GERD (gastroesophageal reflux disease)    Allergies  Allergen Reactions  . Azithromycin   . Cleocin [Clindamycin Hcl]   . Oxycodone Nausea And Vomiting    Social History   Social History  . Marital status: Divorced    Spouse name: N/A  . Number of children: N/A  . Years of education: N/A   Social History Main Topics  . Smoking status: Current Every Day Smoker    Packs/day: 1.00    Years: 30.00     Types: Cigarettes  . Smokeless tobacco: Never Used  . Alcohol use 9.0 oz/week    15 Cans of beer per week  . Drug use: No  . Sexual activity: Not Asked   Other Topics Concern  . None   Social History Narrative  . None    Vitals:   03/14/17 0918  BP: 120/78  Pulse: 73  Resp: 12  Temp: 97.6 F (36.4 C)  SpO2: 97%   Body mass index is 32.7 kg/m.   Physical Exam  Nursing note and vitals reviewed. Constitutional: He is oriented to person, place, and time. He appears well-developed. He does not appear ill. No distress.  HENT:  Head: Normocephalic and atraumatic.  Right Ear: Tympanic membrane, external ear and ear canal normal.  Left Ear: Tympanic membrane, external ear and ear canal normal.  Nose: Rhinorrhea present. Right sinus exhibits no maxillary sinus tenderness and no frontal sinus tenderness. Left sinus exhibits no maxillary sinus tenderness and no frontal sinus tenderness.  Mouth/Throat: Oropharynx is clear and moist and mucous membranes are normal.  Eyes: Pupils are equal, round, and reactive to light. Conjunctivae are normal.  Cardiovascular: Normal rate and regular rhythm.   No murmur heard. Respiratory: Effort normal. No stridor. No respiratory distress. He has wheezes. He has no rales.  Lymphadenopathy:       Head (right side): No submandibular adenopathy present.       Head (left side): No submandibular adenopathy present.    He has no cervical adenopathy.  Neurological: He is alert and oriented to person, place, and time. He has normal strength.  Skin: Skin is warm. No rash noted. No erythema.  Psychiatric: He has a normal mood and affect. His speech is normal.  Well groomed, good eye contact.    ASSESSMENT AND PLAN:   Mr.Jeff Norton was seen today for nasal congestion.  Diagnoses and all orders for this visit:  Respiratory tract infection  Could be viral and now having residual symptoms but given his Hx of asthma and persistency of symptoms, I am  recommending abx treatment. Instructed about warning signs. F/U with PCP.  -     doxycycline (VIBRA-TABS) 100 MG tablet; Take 1 tablet (100 mg total) by mouth 2 (two) times daily.  Mild asthma with exacerbation, unspecified whether persistent  COPD-asthma symptoms. Here in the office Duoneb neb x 1, which helped. Still wheezing, no rhonchi or rales. Breo sample given. Albuterol inh 2 puff every 6 hours for a week then as needed for wheezing or shortness of breath.  F/U in 4 days.   Prednisone side effects discussed. Instructed about warning signs.  -     predniSONE (DELTASONE) 20 MG tablet; Take 2 tablets (40 mg total) by mouth daily with breakfast.  Tobacco abuse  Strongly recommend smoking cessation, adverse effects discussed.   -Mr. SHELDEN RABORN advised to seek attention immediately if symptoms worsen.       Rashee Marschall G. [place], MD  [facility]. [place] office.

## 2017-03-14 NOTE — Patient Instructions (Addendum)
  [name] I have seen you today for an acute visit.  A few things to remember from today's visit:   Respiratory tract infection - Plan: doxycycline (VIBRA-TABS) 100 MG tablet  Mild asthma with exacerbation, unspecified whether persistent - Plan: predniSONE (DELTASONE) 20 MG tablet  Tobacco abuse   Medications prescribed today are intended for short period of time and will not be refill upon request, a follow up appointment might be necessary to discuss continuation of of treatment if appropriate.   Albuterol inh 2 puff every 6 hours for a week then as needed for wheezing or shortness of breath.    In general please monitor for signs of worsening symptoms and seek immediate medical attention if any concerning.  I hope you get better soon!

## 2017-03-25 DIAGNOSIS — G4733 Obstructive sleep apnea (adult) (pediatric): Secondary | ICD-10-CM | POA: Diagnosis not present

## 2017-04-22 ENCOUNTER — Other Ambulatory Visit: Payer: Self-pay | Admitting: Internal Medicine

## 2017-04-22 DIAGNOSIS — J4520 Mild intermittent asthma, uncomplicated: Secondary | ICD-10-CM

## 2017-04-24 DIAGNOSIS — G4733 Obstructive sleep apnea (adult) (pediatric): Secondary | ICD-10-CM | POA: Diagnosis not present

## 2017-04-29 DIAGNOSIS — D2262 Melanocytic nevi of left upper limb, including shoulder: Secondary | ICD-10-CM | POA: Diagnosis not present

## 2017-04-29 DIAGNOSIS — D485 Neoplasm of uncertain behavior of skin: Secondary | ICD-10-CM | POA: Diagnosis not present

## 2017-04-29 DIAGNOSIS — L570 Actinic keratosis: Secondary | ICD-10-CM | POA: Diagnosis not present

## 2017-04-29 DIAGNOSIS — D2361 Other benign neoplasm of skin of right upper limb, including shoulder: Secondary | ICD-10-CM | POA: Diagnosis not present

## 2017-04-29 DIAGNOSIS — Z85828 Personal history of other malignant neoplasm of skin: Secondary | ICD-10-CM | POA: Diagnosis not present

## 2017-04-29 DIAGNOSIS — L821 Other seborrheic keratosis: Secondary | ICD-10-CM | POA: Diagnosis not present

## 2017-04-29 DIAGNOSIS — D225 Melanocytic nevi of trunk: Secondary | ICD-10-CM | POA: Diagnosis not present

## 2017-04-30 ENCOUNTER — Other Ambulatory Visit: Payer: Self-pay | Admitting: Internal Medicine

## 2017-04-30 DIAGNOSIS — M4802 Spinal stenosis, cervical region: Secondary | ICD-10-CM

## 2017-04-30 DIAGNOSIS — M19071 Primary osteoarthritis, right ankle and foot: Secondary | ICD-10-CM

## 2017-04-30 MED ORDER — DICLOFENAC SODIUM 75 MG PO TBEC: DELAYED_RELEASE_TABLET | ORAL | 1 refills | Status: DC

## 2017-05-04 ENCOUNTER — Telehealth: Payer: Self-pay

## 2017-05-04 DIAGNOSIS — F3176 Bipolar disorder, in full remission, most recent episode depressed: Secondary | ICD-10-CM

## 2017-05-04 MED ORDER — LAMOTRIGINE 200 MG PO TABS: 200.0000 mg | ORAL_TABLET | Freq: Two times a day (BID) | ORAL | 1 refills | Status: DC

## 2017-05-04 MED ORDER — RISPERIDONE 2 MG PO TABS: 2.0000 mg | ORAL_TABLET | Freq: Two times a day (BID) | ORAL | 1 refills | Status: DC

## 2017-05-04 NOTE — Telephone Encounter (Signed)
Rx rf request for Risperdal and Lamictal. Erx for 90 and 1 sent as requested.   Called [facility] and canceled rx's sent there by mistake.

## 2017-05-14 DIAGNOSIS — H524 Presbyopia: Secondary | ICD-10-CM | POA: Diagnosis not present

## 2017-05-14 DIAGNOSIS — H52203 Unspecified astigmatism, bilateral: Secondary | ICD-10-CM | POA: Diagnosis not present

## 2017-05-14 DIAGNOSIS — R7303 Prediabetes: Secondary | ICD-10-CM | POA: Diagnosis not present

## 2017-05-14 DIAGNOSIS — H5213 Myopia, bilateral: Secondary | ICD-10-CM | POA: Diagnosis not present

## 2017-05-19 ENCOUNTER — Encounter: Payer: Self-pay | Admitting: Pulmonary Disease

## 2017-05-21 ENCOUNTER — Encounter: Payer: Self-pay | Admitting: Pulmonary Disease

## 2017-05-21 ENCOUNTER — Telehealth: Payer: Self-pay | Admitting: Internal Medicine

## 2017-05-21 ENCOUNTER — Ambulatory Visit: Payer: Medicare Other | Admitting: Pulmonary Disease

## 2017-05-21 DIAGNOSIS — J449 Chronic obstructive pulmonary disease, unspecified: Secondary | ICD-10-CM | POA: Diagnosis not present

## 2017-05-21 DIAGNOSIS — Z9989 Dependence on other enabling machines and devices: Secondary | ICD-10-CM

## 2017-05-21 DIAGNOSIS — G4733 Obstructive sleep apnea (adult) (pediatric): Secondary | ICD-10-CM

## 2017-05-21 NOTE — Telephone Encounter (Signed)
Pt came by the office stating that he would like to get the Shingrix vaccine. Can he be added to the waiting list? Thanks!

## 2017-05-21 NOTE — Progress Notes (Signed)
   Subjective:    Patient ID: Jeff Norton, male    DOB: 1965-08-22, 51 y.o.   MRN: [id number]  HPI  51 year old  Smoker for FU of severe OSA & copd/asthma  He has bipolar disorder and sees a Social worker at [employer]. He works at a [employer] He also has asthma symptoms, well controlled on Breo  He has been able to cut down cigarettes to about 5 a day with vaping.  He does not like the nicotine patch, nicotine lozenges did not agree with him and also stay away from Chantix.  He remains on modafinil since risperidone makes him sleepy. CPAP is working well and reports  sleeping well and waking up rested without dryness of mouth or headaches.  CPAP has helped his daytime somnolence and fatigue. Download was reviewed, shows excellent control of events on auto settings 10-20 cm with average pressure of 16 and minimal leak.  His compliance is excellent. He denies any problems with mask or pressure  His weight is unchanged and he denies any sleep pressure in the afternoons     Significant tests/ events  PSG 03/2006 showed AHI 58/hour which was corrected by CPAP of 11 cm with a medium fullface mask.  PFTs 11/2015 ratio 59, FEV1 52%, FVC 68%, no bronchodilator response   Review of Systems Patient denies significant dyspnea,cough, hemoptysis,  chest pain, palpitations, pedal edema, orthopnea, paroxysmal nocturnal dyspnea, lightheadedness, nausea, vomiting, abdominal or  leg pains      Objective:   Physical Exam  Gen. Pleasant, well-nourished, in no distress ENT - no thrush, no post nasal drip Neck: No JVD, no thyromegaly, no carotid bruits Lungs: no use of accessory muscles, no dullness to percussion, clear without rales or rhonchi  Cardiovascular: Rhythm regular, heart sounds  normal, no murmurs or gallops, no peripheral edema Musculoskeletal: No deformities, no cyanosis or clubbing         Assessment & Plan:

## 2017-05-21 NOTE — Patient Instructions (Signed)
Auto CPAP settings are working well

## 2017-05-21 NOTE — Assessment & Plan Note (Signed)
His average pressure seems to fluctuate between 13 and 16 cm hence we will continue him on auto CPAP settings. CPAP supplies will be renewed for a year.   Weight loss encouraged, compliance with goal of at least 4-6 hrs every night is the expectation. Advised against medications with sedative side effects Cautioned against driving when sleepy - understanding that sleepiness will vary on a day to day basis

## 2017-05-21 NOTE — Assessment & Plan Note (Signed)
Ct bREO  Smoking cessation again emphasized.  He is making some progress with weight weeping and hopes to quit tobacco soon

## 2017-05-22 NOTE — Telephone Encounter (Signed)
Patient added to shingrix waitlist, will call him back when meds arrive in office

## 2017-05-25 DIAGNOSIS — G4733 Obstructive sleep apnea (adult) (pediatric): Secondary | ICD-10-CM | POA: Diagnosis not present

## 2017-06-05 ENCOUNTER — Other Ambulatory Visit: Payer: Self-pay | Admitting: Internal Medicine

## 2017-06-05 DIAGNOSIS — E118 Type 2 diabetes mellitus with unspecified complications: Secondary | ICD-10-CM

## 2017-06-05 DIAGNOSIS — E785 Hyperlipidemia, unspecified: Secondary | ICD-10-CM

## 2017-06-23 DIAGNOSIS — L988 Other specified disorders of the skin and subcutaneous tissue: Secondary | ICD-10-CM | POA: Diagnosis not present

## 2017-06-23 DIAGNOSIS — D485 Neoplasm of uncertain behavior of skin: Secondary | ICD-10-CM | POA: Diagnosis not present

## 2017-06-23 DIAGNOSIS — L819 Disorder of pigmentation, unspecified: Secondary | ICD-10-CM | POA: Diagnosis not present

## 2017-06-24 DIAGNOSIS — G4733 Obstructive sleep apnea (adult) (pediatric): Secondary | ICD-10-CM | POA: Diagnosis not present

## 2017-06-26 ENCOUNTER — Ambulatory Visit: Payer: Medicare Other

## 2017-06-26 DIAGNOSIS — Z299 Encounter for prophylactic measures, unspecified: Secondary | ICD-10-CM

## 2017-07-17 ENCOUNTER — Telehealth: Payer: Self-pay

## 2017-07-17 NOTE — Telephone Encounter (Signed)
(  Key: [id number])

## 2017-07-22 ENCOUNTER — Telehealth: Payer: Self-pay | Admitting: Internal Medicine

## 2017-07-22 NOTE — Telephone Encounter (Signed)
Copied from [place] [id number]. Topic: Quick Communication - See Telephone Encounter >> Jul 22, 2017  3:42 PM Bea Graff, NT wrote: CRM for notification. See Telephone encounter for: Lenna Sciara from [facility] calling needing a clinical question answered for a PA for the medication modafinil (PROVIGIL) 200 mg. CB#: [id number] Ref#: [id number] DQ  07/22/17.

## 2017-07-23 NOTE — Telephone Encounter (Signed)
The appeal form has been faxed back for reconsideration.

## 2017-07-23 NOTE — Telephone Encounter (Signed)
Spoke with Melissa from [facility] in regard to Provigil. Pt. Is continuing medication.

## 2017-07-24 DIAGNOSIS — G4733 Obstructive sleep apnea (adult) (pediatric): Secondary | ICD-10-CM | POA: Diagnosis not present

## 2017-07-29 ENCOUNTER — Ambulatory Visit (INDEPENDENT_AMBULATORY_CARE_PROVIDER_SITE_OTHER): Payer: Medicare Other | Admitting: Internal Medicine

## 2017-07-29 ENCOUNTER — Encounter: Payer: Self-pay | Admitting: Internal Medicine

## 2017-07-29 VITALS — BP 120/70 | HR 72 | Temp 98.1°F | Resp 16 | Ht 69.0 in | Wt 232.0 lb

## 2017-07-29 DIAGNOSIS — J4520 Mild intermittent asthma, uncomplicated: Secondary | ICD-10-CM

## 2017-07-29 DIAGNOSIS — J449 Chronic obstructive pulmonary disease, unspecified: Secondary | ICD-10-CM

## 2017-07-29 DIAGNOSIS — J988 Other specified respiratory disorders: Secondary | ICD-10-CM | POA: Insufficient documentation

## 2017-07-29 MED ORDER — PREDNISONE 50 MG PO TABS: ORAL_TABLET | ORAL | 0 refills | Status: AC

## 2017-07-29 MED ORDER — PROMETHAZINE-DM 6.25-15 MG/5ML PO SYRP: 5.0000 mL | ORAL_SOLUTION | Freq: Four times a day (QID) | ORAL | 0 refills | Status: AC | PRN

## 2017-07-29 MED ORDER — AMOXICILLIN-POT CLAVULANATE 875-125 MG PO TABS: 1.0000 | ORAL_TABLET | Freq: Two times a day (BID) | ORAL | 0 refills | Status: AC

## 2017-07-29 MED ORDER — FLUTICASONE FUROATE-VILANTEROL 200-25 MCG/INH IN AEPB: 1.0000 | INHALATION_SPRAY | Freq: Every day | RESPIRATORY_TRACT | 1 refills | Status: DC

## 2017-07-29 NOTE — Patient Instructions (Signed)
Cough, Adult  Coughing is a reflex that clears your throat and your airways. Coughing helps to heal and protect your lungs. It is normal to cough occasionally, but a cough that happens with other symptoms or lasts a long time may be a sign of a condition that needs treatment. A cough may last only 2-3 weeks (acute), or it may last longer than 8 weeks (chronic).  What are the causes?  Coughing is commonly caused by:   Breathing in substances that irritate your lungs.   A viral or bacterial respiratory infection.   Allergies.   Asthma.   Postnasal drip.   Smoking.   Acid backing up from the stomach into the esophagus (gastroesophageal reflux).   Certain medicines.   Chronic lung problems, including COPD (or rarely, lung cancer).   Other medical conditions such as heart failure.    Follow these instructions at home:  Pay attention to any changes in your symptoms. Take these actions to help with your discomfort:   Take medicines only as told by your health care provider.  ? If you were prescribed an antibiotic medicine, take it as told by your health care provider. Do not stop taking the antibiotic even if you start to feel better.  ? Talk with your health care provider before you take a cough suppressant medicine.   Drink enough fluid to keep your urine clear or pale yellow.   If the air is dry, use a cold steam vaporizer or humidifier in your bedroom or your home to help loosen secretions.   Avoid anything that causes you to cough at work or at home.   If your cough is worse at night, try sleeping in a semi-upright position.   Avoid cigarette smoke. If you smoke, quit smoking. If you need help quitting, ask your health care provider.   Avoid caffeine.   Avoid alcohol.   Rest as needed.    Contact a health care provider if:   You have new symptoms.   You cough up pus.   Your cough does not get better after 2-3 weeks, or your cough gets worse.   You cannot control your cough with suppressant  medicines and you are losing sleep.   You develop pain that is getting worse or pain that is not controlled with pain medicines.   You have a fever.   You have unexplained weight loss.   You have night sweats.  Get help right away if:   You cough up blood.   You have difficulty breathing.   Your heartbeat is very fast.  This information is not intended to replace advice given to you by your health care provider. Make sure you discuss any questions you have with your health care provider.  Document Released: 11/29/2010 Document Revised: 11/08/2015 Document Reviewed: 08/09/2014  Elsevier Interactive Patient Education  2018 [employer]

## 2017-07-29 NOTE — Progress Notes (Signed)
Subjective:  Patient ID: Jeff Norton, male    DOB: 10/24/1965  Age: 52 y.o. MRN: [id number]  CC: Cough   HPI SANDERS MANNINEN presents for concerns about a 2-week history of cough that is productive of thick green phlegm.  He also complains of wheezing and shortness of breath.  He has not been using his LABA/ICS inhaler because he ran out and he complains it is too expensive.  He has been getting some symptom relief with an albuterol inhaler.  Outpatient Medications Prior to Visit  Medication Sig Dispense Refill  . aspirin EC 81 MG tablet Take 1 tablet (81 mg total) by mouth daily. 90 tablet 3  . diclofenac (VOLTAREN) 75 MG EC tablet TK 1 T PO BID 180 tablet 1  . lamoTRIgine (LAMICTAL) 200 MG tablet Take 1 tablet (200 mg total) 2 (two) times daily by mouth. 180 tablet 1  . modafinil (PROVIGIL) 200 MG tablet TAKE ONE TABLET BY MOUTH ONE TIME DAILY  30 tablet 4  . montelukast (SINGULAIR) 10 MG tablet Take 1 tablet (10 mg total) by mouth at bedtime. 30 tablet 3  . Multiple Vitamin (MULTIVITAMIN) tablet Take 1 tablet by mouth daily.    Marland Kitchen PROAIR RESPICLICK 786 (90 Base) MCG/ACT AEPB INHALE 2 PUFFS INTO THE LUNGS FOUR TIMES DAILY AS NEEDED 1 each 11  . risperiDONE (RISPERDAL) 2 MG tablet Take 1 tablet (2 mg total) 2 (two) times daily by mouth. 180 tablet 1  . rosuvastatin (CRESTOR) 10 MG tablet TAKE 1 TABLET BY MOUTH  DAILY 90 tablet 1  . BREO ELLIPTA 200-25 MCG/INH AEPB INHALE 1 PUFF INTO THE LUNGS DAILY 90 each 1   No facility-administered medications prior to visit.     ROS Review of Systems  Constitutional: Negative for chills, fatigue and fever.  HENT: Negative.  Negative for facial swelling, sore throat and trouble swallowing.   Eyes: Negative.   Respiratory: Positive for cough, shortness of breath and wheezing. Negative for chest tightness and stridor.   Cardiovascular: Negative.  Negative for chest pain, palpitations and leg swelling.  Gastrointestinal: Negative for abdominal  pain, diarrhea, nausea and vomiting.  Endocrine: Negative.   Genitourinary: Negative.  Negative for difficulty urinating.  Musculoskeletal: Negative.  Negative for back pain and myalgias.  Skin: Negative.  Negative for color change and rash.  Allergic/Immunologic: Negative.   Neurological: Negative.   Hematological: Negative for adenopathy. Does not bruise/bleed easily.  Psychiatric/Behavioral: Negative.     Objective:  BP 120/70 (BP Location: Left Arm, Patient Position: Sitting, Cuff Size: Large)   Pulse 72   Temp 98.1 F (36.7 C) (Oral)   Resp 16   Ht 5\' 9"  (1.753 m)   Wt 232 lb (105.2 kg)   SpO2 98%   BMI 34.26 kg/m   BP Readings from Last 3 Encounters:  07/29/17 120/70  05/21/17 126/74  03/14/17 120/78    Wt Readings from Last 3 Encounters:  07/29/17 232 lb (105.2 kg)  05/21/17 230 lb 9.6 oz (104.6 kg)  03/14/17 221 lb 6.4 oz (100.4 kg)    Physical Exam  Constitutional: He is oriented to person, place, and time.  Non-toxic appearance. He does not have a sickly appearance. He does not appear ill. No distress.  HENT:  Mouth/Throat: Oropharynx is clear and moist. No oropharyngeal exudate.  Eyes: Conjunctivae are normal. Left eye exhibits no discharge. No scleral icterus.  Neck: Normal range of motion. Neck supple. No JVD present. No thyromegaly present.  Cardiovascular: Normal  rate, regular rhythm and normal heart sounds. Exam reveals no gallop.  No murmur heard. Pulmonary/Chest: Effort normal. No accessory muscle usage. No tachypnea. No respiratory distress. He has no decreased breath sounds. He has no wheezes. He has rhonchi in the right upper field, the right middle field, the right lower field, the left upper field, the left middle field and the left lower field. He has no rales.  He has diffuse, symmetrical, bilateral expiratory rhonchi but good air movement.  Abdominal: Soft. Bowel sounds are normal. He exhibits no distension and no mass. There is no tenderness.    Musculoskeletal: Normal range of motion. He exhibits no edema, tenderness or deformity.  Lymphadenopathy:    He has no cervical adenopathy.  Neurological: He is alert and oriented to person, place, and time.  Skin: Skin is warm and dry. No rash noted. He is not diaphoretic. No erythema. No pallor.  Vitals reviewed.   Lab Results  Component Value Date   WBC 7.9 04/14/2016   HGB 13.8 04/14/2016   HCT 40.4 04/14/2016   PLT 178.0 04/14/2016   GLUCOSE 139 (H) 02/19/2017   CHOL 170 08/19/2016   TRIG 260.0 (H) 08/19/2016   HDL 36.60 (L) 08/19/2016   LDLDIRECT 102.0 08/19/2016   LDLCALC 115 (H) 11/07/2015   ALT 38 08/19/2016   AST 27 08/19/2016   NA 141 02/19/2017   K 4.6 02/19/2017   CL 106 02/19/2017   CREATININE 0.73 02/19/2017   BUN 13 02/19/2017   CO2 27 02/19/2017   TSH 1.08 04/14/2016   PSA 0.47 11/07/2015   HGBA1C 6.0 02/19/2017   MICROALBUR 4.8 (H) 02/19/2017    Ct Cervical Spine Wo Contrast  Result Date: 09/22/2016 CLINICAL DATA:  Cervical radiculitis. Right cervical pain. Left upper extremity pain. EXAM: CT CERVICAL SPINE WITHOUT CONTRAST TECHNIQUE: Multidetector CT imaging of the cervical spine was performed without intravenous contrast. Multiplanar CT image reconstructions were also generated. COMPARISON:  03/28/2011 FINDINGS: Alignment: Normal. Skull base and vertebrae: No acute fracture. No primary bone lesion or focal pathologic process. Soft tissues and spinal canal: No prevertebral fluid or swelling. No visible canal hematoma. Disc levels: Degenerative disc disease with disc height loss at C5-6 and T1-2. Anterior cervical fusion at C6-7 with plate and screw fixation, without hardware failure or complication. Solid osseous bridging across the C6-7 disc space. Left uncovertebral degenerative changes at C4-5 with left foraminal encroachment. At C5-6 there is a broad-based disc bulge with bilateral uncovertebral degenerative changes and bilateral foraminal narrowing. At  C6-7 there is interbody fusion without foraminal stenosis. At C7-T1 there is right uncovertebral degenerative changes with moderate right foraminal stenosis. Upper chest: Lung apices are clear. Other: No fluid collection or hematoma. IMPRESSION: 1. Anterior cervical fusion at C6-7 with solid osseous fusion across the disc space. No foraminal stenosis. 2. Adjacent segment degenerative disc disease at C5-6 with bilateral uncovertebral degenerative changes and bilateral foraminal stenosis. 3. At C7-T1 there is right uncovertebral degenerative changes with moderate right foraminal stenosis. Electronically Signed   By: Kathreen Devoid   On: 09/22/2016 12:51    Assessment & Plan:   Graysin was seen today for cough.  Diagnoses and all orders for this visit:  COPD with asthma ([place])- as below -     predniSONE (DELTASONE) 50 MG tablet; One po QD for 5 days -     fluticasone furoate-vilanterol (BREO ELLIPTA) 200-25 MCG/INH AEPB; Inhale 1 puff into the lungs daily.  RTI (respiratory tract infection)- I will treat the infection  with Augmentin. -     amoxicillin-clavulanate (AUGMENTIN) 875-125 MG tablet; Take 1 tablet by mouth 2 (two) times daily for 10 days. -     promethazine-dextromethorphan (PROMETHAZINE-DM) 6.25-15 MG/5ML syrup; Take 5 mLs by mouth 4 (four) times daily as needed for up to 10 days for cough.  Asthma, mild intermittent, poorly controlled- He is having an acute exacerbation and his FeNO score is elevated.  I have therefore recommended that he take a course of systemic steroids.  I also gave him samples of the Breo inhaler. -     fluticasone furoate-vilanterol (BREO ELLIPTA) 200-25 MCG/INH AEPB; Inhale 1 puff into the lungs daily.   I have changed Lajuan Lines. Kawecki's BREO ELLIPTA to fluticasone furoate-vilanterol. I am also having him start on predniSONE, amoxicillin-clavulanate, and promethazine-dextromethorphan. Additionally, I am having him maintain his aspirin EC, montelukast, PROAIR RESPICLICK,  modafinil, diclofenac, risperiDONE, lamoTRIgine, multivitamin, and rosuvastatin.  Meds ordered this encounter  Medications  . predniSONE (DELTASONE) 50 MG tablet    Sig: One po QD for 5 days    Dispense:  5 tablet    Refill:  0  . fluticasone furoate-vilanterol (BREO ELLIPTA) 200-25 MCG/INH AEPB    Sig: Inhale 1 puff into the lungs daily.    Dispense:  90 each    Refill:  1  . amoxicillin-clavulanate (AUGMENTIN) 875-125 MG tablet    Sig: Take 1 tablet by mouth 2 (two) times daily for 10 days.    Dispense:  20 tablet    Refill:  0  . promethazine-dextromethorphan (PROMETHAZINE-DM) 6.25-15 MG/5ML syrup    Sig: Take 5 mLs by mouth 4 (four) times daily as needed for up to 10 days for cough.    Dispense:  118 mL    Refill:  0     Follow-up: Return in about 3 weeks (around 08/19/2017).  Scarlette Calico, MD

## 2017-08-07 ENCOUNTER — Encounter: Payer: Self-pay | Admitting: Family

## 2017-08-07 ENCOUNTER — Ambulatory Visit (INDEPENDENT_AMBULATORY_CARE_PROVIDER_SITE_OTHER): Payer: Medicare Other | Admitting: Family

## 2017-08-07 VITALS — BP 122/72 | HR 64 | Temp 98.5°F | Ht 69.0 in | Wt 216.1 lb

## 2017-08-07 DIAGNOSIS — R197 Diarrhea, unspecified: Secondary | ICD-10-CM | POA: Diagnosis not present

## 2017-08-07 NOTE — Patient Instructions (Signed)
Diarrhea, Adult °Diarrhea is when you have loose and water poop (stool) often. Diarrhea can make you feel weak and cause you to get dehydrated. Dehydration can make you tired and thirsty, make you have a dry mouth, and make it so you pee (urinate) less often. Diarrhea often lasts 2-3 days. However, it can last longer if it is a sign of something more serious. It is important to treat your diarrhea as told by your doctor. °Follow these instructions at home: °Eating and drinking ° °Follow these recommendations as told by your doctor: °· Take an oral rehydration solution (ORS). This is a drink that is sold at pharmacies and stores. °· Drink clear fluids, such as: °? Water. °? Ice chips. °? Diluted fruit juice. °? Low-calorie sports drinks. °· Eat bland, easy-to-digest foods in small amounts as you are able. These foods include: °? Bananas. °? Applesauce. °? Rice. °? Low-fat (lean) meats. °? Toast. °? Crackers. °· Avoid drinking fluids that have a lot of sugar or caffeine in them. °· Avoid alcohol. °· Avoid spicy or fatty foods. ° °General instructions ° °· Drink enough fluid to keep your pee (urine) clear or pale yellow. °· Wash your hands often. If you cannot use soap and water, use hand sanitizer. °· Make sure that all people in your home wash their hands well and often. °· Take over-the-counter and prescription medicines only as told by your doctor. °· Rest at home while you get better. °· Watch your condition for any changes. °· Take a warm bath to help with any burning or pain from having diarrhea. °· Keep all follow-up visits as told by your doctor. This is important. °Contact a doctor if: °· You have a fever. °· Your diarrhea gets worse. °· You have new symptoms. °· You cannot keep fluids down. °· You feel light-headed or dizzy. °· You have a headache. °· You have muscle cramps. °Get help right away if: °· You have chest pain. °· You feel very weak or you pass out (faint). °· You have bloody or black poop or  poop that look like tar. °· You have very bad pain, cramping, or bloating in your belly (abdomen). °· You have trouble breathing or you are breathing very quickly. °· Your heart is beating very quickly. °· Your skin feels cold and clammy. °· You feel confused. °· You have signs of dehydration, such as: °? Dark pee, hardly any pee, or no pee. °? Cracked lips. °? Dry mouth. °? Sunken eyes. °? Sleepiness. °? Weakness. °This information is not intended to replace advice given to you by your health care provider. Make sure you discuss any questions you have with your health care provider. °Document Released: 11/19/2007 Document Revised: 12/21/2015 Document Reviewed: 02/06/2015 °Elsevier Interactive Patient Education © 2018 [employer] ° °

## 2017-08-07 NOTE — Progress Notes (Signed)
Jeff Norton is a 52 y.o. male with the following history as recorded in [employer]:  Patient Active Problem List   Diagnosis Date Noted  . RTI (respiratory tract infection) 07/29/2017  . Bipolar 1 disorder, depressed, full remission ([place]) 11/20/2016  . Spinal stenosis in cervical region 09/18/2016  . Sleep apnea with hypersomnolence 08/19/2016  . Bile salt-induced diarrhea 01/02/2016  . Chronic pansinusitis 01/01/2016  . COPD with asthma ([place]) 11/07/2015  . Type II diabetes mellitus with manifestations ([place]) 11/07/2015  . Routine general medical examination at a health care facility 11/07/2015  . Obesity (BMI 30.0-34.9) 02/24/2012  . Tobacco abuse 02/24/2012  . Osteoarthritis of ankle and foot 10/08/2007  . Hyperlipidemia LDL goal <100 04/16/2007  . Allergic rhinitis due to pollen 04/16/2007  . EMPHYSEMA 04/16/2007  . Asthma, mild intermittent, poorly controlled 04/16/2007  . OSA on CPAP 04/16/2007    Current Outpatient Medications  Medication Sig Dispense Refill  . aspirin EC 81 MG tablet Take 1 tablet (81 mg total) by mouth daily. 90 tablet 3  . diclofenac (VOLTAREN) 75 MG EC tablet TK 1 T PO BID 180 tablet 1  . fluticasone furoate-vilanterol (BREO ELLIPTA) 200-25 MCG/INH AEPB Inhale 1 puff into the lungs daily. 90 each 1  . lamoTRIgine (LAMICTAL) 200 MG tablet Take 1 tablet (200 mg total) 2 (two) times daily by mouth. 180 tablet 1  . modafinil (PROVIGIL) 200 MG tablet TAKE ONE TABLET BY MOUTH ONE TIME DAILY  30 tablet 4  . montelukast (SINGULAIR) 10 MG tablet Take 1 tablet (10 mg total) by mouth at bedtime. 30 tablet 3  . Multiple Vitamin (MULTIVITAMIN) tablet Take 1 tablet by mouth daily.    Marland Kitchen PROAIR RESPICLICK 245 (90 Base) MCG/ACT AEPB INHALE 2 PUFFS INTO THE LUNGS FOUR TIMES DAILY AS NEEDED 1 each 11  . risperiDONE (RISPERDAL) 2 MG tablet Take 1 tablet (2 mg total) 2 (two) times daily by mouth. 180 tablet 1  . rosuvastatin (CRESTOR) 10 MG tablet TAKE 1 TABLET BY MOUTH   DAILY 90 tablet 1   No current facility-administered medications for this visit.     Allergies: Azithromycin; Cleocin [clindamycin hcl]; and Oxycodone  Past Medical History:  Diagnosis Date  . Allergy   . Anxiety   . Asthma   . Bipolar 1 disorder ([place])   . Depression   . GERD (gastroesophageal reflux disease)     Past Surgical History:  Procedure Laterality Date  . CHOLECYSTECTOMY    . CYST REMOVAL HAND  1997  . FRACTURE SURGERY    . GALLBLADDER SURGERY    . SPINE SURGERY      Family History  Problem Relation Age of Onset  . Hypertension Mother   . Mental illness Mother   . Diabetes Father   . Hyperlipidemia Father   . Depression Sister   . Diabetes Sister   . Hyperlipidemia Sister   . Cancer Maternal Grandmother   . Mental illness Maternal Grandmother     Social History   Tobacco Use  . Smoking status: Current Every Day Smoker    Packs/day: 1.00    Years: 30.00    Pack years: 30.00    Types: Cigarettes  . Smokeless tobacco: Never Used  . Tobacco comment: 5cigs a day as of 05/21/17 ep  Substance Use Topics  . Alcohol use: Yes    Alcohol/week: 9.0 oz    Types: 15 Cans of beer per week    Subjective:  Patient ate at restaurant on  Monday evening- notes that started with diarrhea within a few hours of eating at the restaurant; on Tuesday, progressed into vomiting; on Wednesday, was able to eat eggs/ potatoes; notes that took Imodium and is feeling better today; is concerned about what may happen with his symptoms going into the weekend;  Is on Augmentin from recent COPD exacerbation; started on 07/29/2017- has tolerated this medication in the past with no difficulty; has one more day of Augmentin;   Objective:  Vitals:   08/07/17 1510  BP: 122/72  Pulse: 64  Temp: 98.5 F (36.9 C)  TempSrc: Oral  SpO2: 98%  Weight: 216 lb 1.9 oz (98 kg)  Height: 5\' 9"  (1.753 m)    General: Well developed, well nourished, in no acute distress  Skin : Warm and dry.  Head:  Normocephalic and atraumatic  Eyes: Sclera and conjunctiva clear; pupils round and reactive to light; extraocular movements intact  Ears: External normal; canals clear; tympanic membranes normal  Oropharynx: Pink, supple. No suspicious lesions  Neck: Supple without thyromegaly, adenopathy  Lungs: Respirations unlabored; clear to auscultation bilaterally without wheeze, rales, rhonchi  CVS exam: normal rate and regular rhythm.  Abdomen: Soft; nontender; nondistended; normoactive bowel sounds; no masses or hepatosplenomegaly  Neurologic: Alert and oriented; speech intact; face symmetrical; moves all extremities well; CNII-XII intact without focal deficit   Assessment:  1. Diarrhea, unspecified type     Plan:  Appears to be resolving; patient notes he is feeling much better today; BRAT diet and need for hydration (Gatorade); stop Augmentin in case this is aggravating underlying symptoms; follow-up worse, no better.   No Follow-up on file.  No orders of the defined types were placed in this encounter.   Requested Prescriptions    No prescriptions requested or ordered in this encounter

## 2017-08-24 DIAGNOSIS — G4733 Obstructive sleep apnea (adult) (pediatric): Secondary | ICD-10-CM | POA: Diagnosis not present

## 2017-09-04 ENCOUNTER — Ambulatory Visit (INDEPENDENT_AMBULATORY_CARE_PROVIDER_SITE_OTHER): Payer: Medicare Other

## 2017-09-04 DIAGNOSIS — Z23 Encounter for immunization: Secondary | ICD-10-CM

## 2017-09-23 DIAGNOSIS — G4733 Obstructive sleep apnea (adult) (pediatric): Secondary | ICD-10-CM | POA: Diagnosis not present

## 2017-09-24 ENCOUNTER — Other Ambulatory Visit: Payer: Self-pay | Admitting: Internal Medicine

## 2017-09-24 DIAGNOSIS — G4710 Hypersomnia, unspecified: Secondary | ICD-10-CM

## 2017-09-24 DIAGNOSIS — G4730 Sleep apnea, unspecified: Principal | ICD-10-CM

## 2017-10-09 ENCOUNTER — Other Ambulatory Visit: Payer: Self-pay | Admitting: Internal Medicine

## 2017-10-09 DIAGNOSIS — J4520 Mild intermittent asthma, uncomplicated: Secondary | ICD-10-CM

## 2017-10-12 ENCOUNTER — Ambulatory Visit (INDEPENDENT_AMBULATORY_CARE_PROVIDER_SITE_OTHER): Payer: Medicare Other | Admitting: Internal Medicine

## 2017-10-12 ENCOUNTER — Other Ambulatory Visit (INDEPENDENT_AMBULATORY_CARE_PROVIDER_SITE_OTHER): Payer: Medicare Other

## 2017-10-12 ENCOUNTER — Encounter: Payer: Self-pay | Admitting: Internal Medicine

## 2017-10-12 VITALS — BP 114/70 | HR 75 | Temp 98.2°F | Resp 16 | Ht 69.0 in | Wt 207.0 lb

## 2017-10-12 DIAGNOSIS — R3916 Straining to void: Secondary | ICD-10-CM

## 2017-10-12 DIAGNOSIS — E785 Hyperlipidemia, unspecified: Secondary | ICD-10-CM

## 2017-10-12 DIAGNOSIS — N401 Enlarged prostate with lower urinary tract symptoms: Secondary | ICD-10-CM | POA: Diagnosis not present

## 2017-10-12 DIAGNOSIS — M4802 Spinal stenosis, cervical region: Secondary | ICD-10-CM

## 2017-10-12 DIAGNOSIS — M7712 Lateral epicondylitis, left elbow: Secondary | ICD-10-CM | POA: Diagnosis not present

## 2017-10-12 DIAGNOSIS — E118 Type 2 diabetes mellitus with unspecified complications: Secondary | ICD-10-CM | POA: Diagnosis not present

## 2017-10-12 LAB — LIPID PANEL
Cholesterol: 135 mg/dL (ref 0–200)
HDL: 55 mg/dL (ref >39.00)
LDL Cholesterol: 65 mg/dL (ref 0–99)
NonHDL: 80.23
Total CHOL/HDL Ratio: 2
Triglycerides: 78 mg/dL (ref 0.0–149.0)
VLDL: 15.6 mg/dL (ref 0.0–40.0)

## 2017-10-12 LAB — COMPREHENSIVE METABOLIC PANEL
ALT: 26 U/L (ref 0–53)
AST: 30 U/L (ref 0–37)
Albumin: 4.9 g/dL (ref 3.5–5.2)
Alkaline Phosphatase: 65 U/L (ref 39–117)
BUN: 15 mg/dL (ref 6–23)
CO2: 31 mEq/L (ref 19–32)
Calcium: 9.8 mg/dL (ref 8.4–10.5)
Chloride: 101 mEq/L (ref 96–112)
Creatinine, Ser: 0.8 mg/dL (ref 0.40–1.50)
GFR: 107.43 mL/min (ref >60.00)
Glucose, Bld: 93 mg/dL (ref 70–99)
Potassium: 4.2 mEq/L (ref 3.5–5.1)
Sodium: 140 mEq/L (ref 135–145)
Total Bilirubin: 0.9 mg/dL (ref 0.2–1.2)
Total Protein: 7.7 g/dL (ref 6.0–8.3)

## 2017-10-12 LAB — CBC WITH DIFFERENTIAL/PLATELET
Basophils Absolute: 0.1 10*3/uL (ref 0.0–0.1)
Basophils Relative: 1.1 % (ref 0.0–3.0)
Eosinophils Absolute: 0.2 10*3/uL (ref 0.0–0.7)
Eosinophils Relative: 2.2 % (ref 0.0–5.0)
HCT: 44.8 % (ref 39.0–52.0)
Hemoglobin: 15.3 g/dL (ref 13.0–17.0)
Lymphocytes Relative: 31.1 % (ref 12.0–46.0)
Lymphs Abs: 3.2 10*3/uL (ref 0.7–4.0)
MCHC: 34.1 g/dL (ref 30.0–36.0)
MCV: 84.2 fl (ref 78.0–100.0)
Monocytes Absolute: 0.9 10*3/uL (ref 0.1–1.0)
Monocytes Relative: 8.7 % (ref 3.0–12.0)
Neutro Abs: 5.9 10*3/uL (ref 1.4–7.7)
Neutrophils Relative %: 56.9 % (ref 43.0–77.0)
Platelets: 176 10*3/uL (ref 150.0–400.0)
RBC: 5.32 Mil/uL (ref 4.22–5.81)
RDW: 13.3 % (ref 11.5–15.5)
WBC: 10.4 10*3/uL (ref 4.0–10.5)

## 2017-10-12 LAB — URINALYSIS, ROUTINE W REFLEX MICROSCOPIC
Leukocytes, UA: NEGATIVE
Nitrite: NEGATIVE
Specific Gravity, Urine: >=1.03 — AB (ref 1.000–1.030)
Urine Glucose: NEGATIVE
Urobilinogen, UA: 0.2 (ref 0.0–1.0)
pH: 5.5 (ref 5.0–8.0)

## 2017-10-12 LAB — PSA: PSA: 0.43 ng/mL (ref 0.10–4.00)

## 2017-10-12 MED ORDER — CYCLOBENZAPRINE HCL 10 MG PO TABS: 10.0000 mg | ORAL_TABLET | Freq: Three times a day (TID) | ORAL | 2 refills | Status: DC | PRN

## 2017-10-12 NOTE — Progress Notes (Signed)
Subjective:  Patient ID: Jeff Norton, male    DOB: 08/16/1965  Age: 52 y.o. MRN: [id number]  CC: Diabetes and Hyperlipidemia   HPI [name] presents for f/up -  1.  He complains of a 3-week history of left lateral elbow pain that occurred after he repetitively lifted some cases of Gatorade.  2.  He also complains of mild discomfort on the right side of his neck.  He has had previous C-spine surgery with some remaining spinal stenosis.  He is taking an anti-inflammatory but wants to try a muscle relaxer to relieve the discomfort.  He has no pain that radiates towards his shoulders and he denies paresthesias in his upper extremities.  3.  He also complains of a several history of mildly worsening urinary hesitancy and straining.  Outpatient Medications Prior to Visit  Medication Sig Dispense Refill  . diclofenac (VOLTAREN) 75 MG EC tablet TK 1 T PO BID 180 tablet 1  . fluticasone furoate-vilanterol (BREO ELLIPTA) 200-25 MCG/INH AEPB Inhale 1 puff into the lungs daily. 3 each 3  . lamoTRIgine (LAMICTAL) 200 MG tablet Take 1 tablet (200 mg total) 2 (two) times daily by mouth. 180 tablet 1  . modafinil (PROVIGIL) 200 MG tablet TAKE ONE TABLET BY MOUTH ONE TIME DAILY  30 tablet 3  . montelukast (SINGULAIR) 10 MG tablet Take 1 tablet (10 mg total) by mouth at bedtime. 30 tablet 3  . PROAIR RESPICLICK 423 (90 Base) MCG/ACT AEPB INHALE 2 PUFFS INTO THE LUNGS FOUR TIMES DAILY AS NEEDED 1 each 11  . risperiDONE (RISPERDAL) 2 MG tablet Take 1 tablet (2 mg total) 2 (two) times daily by mouth. 180 tablet 1  . rosuvastatin (CRESTOR) 10 MG tablet TAKE 1 TABLET BY MOUTH  DAILY 90 tablet 1  . aspirin EC 81 MG tablet Take 1 tablet (81 mg total) by mouth daily. 90 tablet 3  . fluticasone furoate-vilanterol (BREO ELLIPTA) 200-25 MCG/INH AEPB Inhale 1 puff into the lungs daily. 90 each 1  . Multiple Vitamin (MULTIVITAMIN) tablet Take 1 tablet by mouth daily.     No facility-administered  medications prior to visit.     ROS Review of Systems  HENT: Negative.   Eyes: Negative for visual disturbance.  Respiratory: Negative.  Negative for cough, chest tightness, shortness of breath and wheezing.   Cardiovascular: Negative.  Negative for chest pain, palpitations and leg swelling.  Gastrointestinal: Negative for abdominal pain, constipation, diarrhea, nausea and vomiting.  Genitourinary: Positive for difficulty urinating. Negative for decreased urine volume, discharge, flank pain, frequency, hematuria, scrotal swelling, testicular pain and urgency.  Musculoskeletal: Positive for arthralgias and neck pain. Negative for myalgias and neck stiffness.  Skin: Negative.  Negative for color change, pallor and rash.  Allergic/Immunologic: Negative.   Neurological: Negative.  Negative for dizziness, weakness and numbness.  Hematological: Negative for adenopathy. Does not bruise/bleed easily.  Psychiatric/Behavioral: Negative.     Objective:  BP 114/70 (BP Location: Left Arm, Patient Position: Sitting, Cuff Size: Large)   Pulse 75   Temp 98.2 F (36.8 C) (Oral)   Resp 16   Ht 5\' 9"  (1.753 m)   Wt 207 lb (93.9 kg)   SpO2 95%   BMI 30.57 kg/m   BP Readings from Last 3 Encounters:  10/12/17 114/70  08/07/17 122/72  07/29/17 120/70    Wt Readings from Last 3 Encounters:  10/12/17 207 lb (93.9 kg)  08/07/17 216 lb 1.9 oz (98 kg)  07/29/17 232 lb (105.2  kg)    Physical Exam  Constitutional: He is oriented to person, place, and time. Vital signs are normal. He appears well-developed and well-nourished.  HENT:  Mouth/Throat: Oropharynx is clear and moist.  Eyes: Conjunctivae are normal. Left eye exhibits no discharge. No scleral icterus.  Neck: Normal range of motion. Neck supple. No JVD present.  Cardiovascular: Normal rate, regular rhythm and normal heart sounds. Exam reveals no gallop and no friction rub.  No murmur heard. Pulmonary/Chest: Effort normal and breath sounds  normal. No stridor. No respiratory distress. He has no wheezes. He has no rales.  Abdominal: Soft. Bowel sounds are normal. He exhibits no distension and no mass. No hernia. Hernia confirmed negative in the right inguinal area and confirmed negative in the left inguinal area.  Genitourinary: Rectum normal, prostate normal, testes normal and penis normal. Rectal exam shows no external hemorrhoid, no internal hemorrhoid, no fissure, no mass, no tenderness, anal tone normal and guaiac negative stool. Prostate is not enlarged and not tender. Right testis shows no mass, no swelling and no tenderness. Left testis shows no mass, no swelling and no tenderness. Circumcised. No penile erythema or penile tenderness. No discharge found.  Musculoskeletal: Normal range of motion. He exhibits no edema, tenderness or deformity.       Left elbow: He exhibits normal range of motion, no swelling, no effusion, no deformity and no laceration. No olecranon process tenderness noted.  Lymphadenopathy:    He has no cervical adenopathy. No inguinal adenopathy noted on the right or left side.  Neurological: He is alert and oriented to person, place, and time.  Skin: Skin is warm and dry.  Vitals reviewed.   Lab Results  Component Value Date   WBC 10.4 10/12/2017   HGB 15.3 10/12/2017   HCT 44.8 10/12/2017   PLT 176.0 10/12/2017   GLUCOSE 93 10/12/2017   CHOL 135 10/12/2017   TRIG 78.0 10/12/2017   HDL 55.00 10/12/2017   LDLDIRECT 102.0 08/19/2016   LDLCALC 65 10/12/2017   ALT 26 10/12/2017   AST 30 10/12/2017   NA 140 10/12/2017   K 4.2 10/12/2017   CL 101 10/12/2017   CREATININE 0.80 10/12/2017   BUN 15 10/12/2017   CO2 31 10/12/2017   TSH 1.08 04/14/2016   PSA 0.43 10/12/2017   HGBA1C 6.0 02/19/2017   MICROALBUR 4.8 (H) 02/19/2017    Ct Cervical Spine Wo Contrast  Result Date: 09/22/2016 CLINICAL DATA:  Cervical radiculitis. Right cervical pain. Left upper extremity pain. EXAM: CT CERVICAL SPINE  WITHOUT CONTRAST TECHNIQUE: Multidetector CT imaging of the cervical spine was performed without intravenous contrast. Multiplanar CT image reconstructions were also generated. COMPARISON:  03/28/2011 FINDINGS: Alignment: Normal. Skull base and vertebrae: No acute fracture. No primary bone lesion or focal pathologic process. Soft tissues and spinal canal: No prevertebral fluid or swelling. No visible canal hematoma. Disc levels: Degenerative disc disease with disc height loss at C5-6 and T1-2. Anterior cervical fusion at C6-7 with plate and screw fixation, without hardware failure or complication. Solid osseous bridging across the C6-7 disc space. Left uncovertebral degenerative changes at C4-5 with left foraminal encroachment. At C5-6 there is a broad-based disc bulge with bilateral uncovertebral degenerative changes and bilateral foraminal narrowing. At C6-7 there is interbody fusion without foraminal stenosis. At C7-T1 there is right uncovertebral degenerative changes with moderate right foraminal stenosis. Upper chest: Lung apices are clear. Other: No fluid collection or hematoma. IMPRESSION: 1. Anterior cervical fusion at C6-7 with solid osseous fusion across  the disc space. No foraminal stenosis. 2. Adjacent segment degenerative disc disease at C5-6 with bilateral uncovertebral degenerative changes and bilateral foraminal stenosis. 3. At C7-T1 there is right uncovertebral degenerative changes with moderate right foraminal stenosis. Electronically Signed   By: Kathreen Devoid   On: 09/22/2016 12:51    Assessment & Plan:   Thatcher was seen today for diabetes and hyperlipidemia.  Diagnoses and all orders for this visit:  Spinal stenosis in cervical region- Will offer flexeril for sx relief -     cyclobenzaprine (FLEXERIL) 10 MG tablet; Take 1 tablet (10 mg total) by mouth 3 (three) times daily as needed for muscle spasms.  Benign prostatic hyperplasia (BPH) with straining on urination- He has mild symptoms,  none of which he wants to treat.  His PSA is low which is reassuring he does not have prostate cancer.  His urinalysis is negative for signs of infection.  He will let me know if he wants to treat the symptoms. -     PSA; Future -     Urinalysis, Routine w reflex microscopic; Future  Type 2 diabetes mellitus with complication, without long-term current use of insulin ([place])- His blood sugar is adequately well controlled. -     Comprehensive metabolic panel; Future -     CBC with Differential/Platelet; Future  Hyperlipidemia LDL goal <100- He has achieved his LDL goal and is doing well on the statin. -     Comprehensive metabolic panel; Future -     CBC with Differential/Platelet; Future -     Lipid panel; Future  Lateral epicondylitis, left elbow- He is getting adequate symptom relief with the anti-inflammatory.  He does not want to pursue therapy.   I have discontinued Domanick Cuccia. Yeatman's aspirin EC and multivitamin. I am also having him start on cyclobenzaprine. Additionally, I am having him maintain his montelukast, PROAIR RESPICLICK, diclofenac, risperiDONE, lamoTRIgine, rosuvastatin, modafinil, and fluticasone furoate-vilanterol.  Meds ordered this encounter  Medications  . cyclobenzaprine (FLEXERIL) 10 MG tablet    Sig: Take 1 tablet (10 mg total) by mouth 3 (three) times daily as needed for muscle spasms.    Dispense:  65 tablet    Refill:  2     Follow-up: Return in about 3 months (around 01/11/2018).  Scarlette Calico, MD

## 2017-10-12 NOTE — Patient Instructions (Signed)
Benign Prostatic Hyperplasia  Benign prostatic hyperplasia (BPH) is an enlarged prostate gland that is caused by the normal aging process and not by cancer. The prostate is a walnut-sized gland that is involved in the production of semen. It is located in front of the rectum and below the bladder. The bladder stores urine and the urethra is the tube that carries the urine out of the body. The prostate may get bigger as a man gets older.  An enlarged prostate can press on the urethra. This can make it harder to pass urine. The build-up of urine in the bladder can cause infection. Back pressure and infection may progress to bladder damage and kidney (renal) failure.  What are the causes?  This condition is part of a normal aging process. However, not all men develop problems from this condition. If the prostate enlarges away from the urethra, urine flow will not be blocked. If it enlarges toward the urethra and compresses it, there will be problems passing urine.  What increases the risk?  This condition is more likely to develop in men over the age of 50 years.  What are the signs or symptoms?  Symptoms of this condition include:  · Getting up often during the night to urinate.  · Needing to urinate frequently during the day.  · Difficulty starting urine flow.  · Decrease in size and strength of your urine stream.  · Leaking (dribbling) after urinating.  · Inability to pass urine. This needs immediate treatment.  · Inability to completely empty your bladder.  · Pain when you pass urine. This is more common if there is also an infection.  · Urinary tract infection (UTI).    How is this diagnosed?  This condition is diagnosed based on your medical history, a physical exam, and your symptoms. Tests will also be done, such as:  · A post-void bladder scan. This measures any amount of urine that may remain in your bladder after you finish urinating.  · A digital rectal exam. In a rectal exam, your health care provider  checks your prostate by putting a lubricated, gloved finger into your rectum to feel the back of your prostate gland. This exam detects the size of your gland and any abnormal lumps or growths.  · An exam of your urine (urinalysis).  · A prostate specific antigen (PSA) screening. This is a blood test used to screen for prostate cancer.  · An ultrasound. This test uses sound waves to electronically produce a picture of your prostate gland.    Your health care provider may refer you to a specialist in kidney and prostate diseases (urologist).  How is this treated?  Once symptoms begin, your health care provider will monitor your condition (active surveillance or watchful waiting). Treatment for this condition will depend on the severity of your condition. Treatment may include:  · Observation and yearly exams. This may be the only treatment needed if your condition and symptoms are mild.  · Medicines to relieve your symptoms, including:  ? Medicines to shrink the prostate.  ? Medicines to relax the muscle of the prostate.  · Surgery in severe cases. Surgery may include:  ? Prostatectomy. In this procedure, the prostate tissue is removed completely through an open incision or with a laparascope or robotics.  ? Transurethral resection of the prostate (TURP). In this procedure, a tool is inserted through the opening at the tip of the penis (urethra). It is used to cut away tissue of   the inner core of the prostate. The pieces are removed through the same opening of the penis. This removes the blockage.  ? Transurethral incision (TUIP). In this procedure, small cuts are made in the prostate. This lessens the prostate's pressure on the urethra.  ? Transurethral microwave thermotherapy (TUMT). This procedure uses microwaves to create heat. The heat destroys and removes a small amount of prostate tissue.  ? Transurethral needle ablation (TUNA). This procedure uses radio frequencies to destroy and remove a small amount of  prostate tissue.  ? Interstitial laser coagulation (ILC). This procedure uses a laser to destroy and remove a small amount of prostate tissue.  ? Transurethral electrovaporization (TUVP). This procedure uses electrodes to destroy and remove a small amount of prostate tissue.  ? Prostatic urethral lift. This procedure inserts an implant to push the lobes of the prostate away from the urethra.    Follow these instructions at home:  · Take over-the-counter and prescription medicines only as told by your health care provider.  · Monitor your symptoms for any changes. Contact your health care provider with any changes.  · Avoid drinking large amounts of liquid before going to bed or out in public.  · Avoid or reduce how much caffeine or alcohol you drink.  · Give yourself time when you urinate.  · Keep all follow-up visits as told by your health care provider. This is important.  Contact a health care provider if:  · You have unexplained back pain.  · Your symptoms do not get better with treatment.  · You develop side effects from the medicine you are taking.  · Your urine becomes very dark or has a bad smell.  · Your lower abdomen becomes distended and you have trouble passing your urine.  Get help right away if:  · You have a fever or chills.  · You suddenly cannot urinate.  · You feel lightheaded, or very dizzy, or you faint.  · There are large amounts of blood or clots in the urine.  · Your urinary problems become hard to manage.  · You develop moderate to severe low back or flank pain. The flank is the side of your body between the ribs and the hip.  These symptoms may represent a serious problem that is an emergency. Do not wait to see if the symptoms will go away. Get medical help right away. Call your local emergency services (911 in the [place] Do not drive yourself to the hospital.  Summary  · Benign prostatic hyperplasia (BPH) is an enlarged prostate that is caused by the normal aging process and not by  cancer.  · An enlarged prostate can press on the urethra. This can make it hard to pass urine.  · This condition is part of a normal aging process and is more likely to develop in men over the age of 50 years.  · Get help right away if you suddenly cannot urinate.  This information is not intended to replace advice given to you by your health care provider. Make sure you discuss any questions you have with your health care provider.  Document Released: 06/02/2005 Document Revised: 07/07/2016 Document Reviewed: 07/07/2016  Elsevier Interactive Patient Education © 2018 [employer]

## 2017-10-13 ENCOUNTER — Encounter: Payer: Self-pay | Admitting: Internal Medicine

## 2017-10-23 DIAGNOSIS — G4733 Obstructive sleep apnea (adult) (pediatric): Secondary | ICD-10-CM | POA: Diagnosis not present

## 2017-10-29 ENCOUNTER — Other Ambulatory Visit: Payer: Self-pay | Admitting: Internal Medicine

## 2017-10-29 DIAGNOSIS — J301 Allergic rhinitis due to pollen: Secondary | ICD-10-CM

## 2017-11-23 DIAGNOSIS — G4733 Obstructive sleep apnea (adult) (pediatric): Secondary | ICD-10-CM | POA: Diagnosis not present

## 2017-12-02 DIAGNOSIS — Z85828 Personal history of other malignant neoplasm of skin: Secondary | ICD-10-CM | POA: Diagnosis not present

## 2017-12-02 DIAGNOSIS — L738 Other specified follicular disorders: Secondary | ICD-10-CM | POA: Diagnosis not present

## 2017-12-02 DIAGNOSIS — I788 Other diseases of capillaries: Secondary | ICD-10-CM | POA: Diagnosis not present

## 2017-12-22 DIAGNOSIS — M7712 Lateral epicondylitis, left elbow: Secondary | ICD-10-CM | POA: Diagnosis not present

## 2017-12-23 DIAGNOSIS — G4733 Obstructive sleep apnea (adult) (pediatric): Secondary | ICD-10-CM | POA: Diagnosis not present

## 2017-12-31 DIAGNOSIS — M7712 Lateral epicondylitis, left elbow: Secondary | ICD-10-CM | POA: Diagnosis not present

## 2018-01-04 ENCOUNTER — Ambulatory Visit: Payer: Self-pay

## 2018-01-04 NOTE — Telephone Encounter (Signed)
Pt. Called to report vomiting x 3 today, and one large diarrhea stool. Reported he started with vomiting this morning at 7:00 AM.  Initial emesis was brown liquid and then it has been clear, after that.  Has attempted to drink sips of water; ate crackers at 12:00 PM.  Reported at 3:00 pm, he drank water again, and within one minute, it came right back up.  With the emesis at 3:00 PM, described the substance as clear with a "small speck of light red blood."  Stated the speck was approx. 1 cm. In size.  Denied any other signs of bleeding.  Denied further diarrhea, since earlier today.  Stated urinated in normal amt. at 12:00 PM.  Denied dizziness, headache, fever/ chills, or pain.  Stated his "stomach is churning and a little nauseated." Stated he ate chicken parmesan sub. from a restaurant last evening.  Also, reported being at a [employer] on Saturday, and being around a large crowd of people.  Reassured pt. since symptoms just started today, and he is taking small amts. of water, he can continue to try treat this at home.  Advised to monitor for any other signs of bleeding. Care advice given per protocol.  Verb. Understanding.  Will call back for any worsening of symptoms.          Reason for Disposition . MILD or MODERATE vomiting (e.g., 1 - 5 times / day)  Answer Assessment - Initial Assessment Questions 1. VOMITING SEVERITY: "How many times have you vomited in the past 24 hours?"     - MILD:  1 - 2 times/day    - MODERATE: 3 - 5 times/day, decreased oral intake without significant weight loss or symptoms of dehydration    - SEVERE: 6 or more times/day, vomits everything or nearly everything, with significant weight loss, symptoms of dehydration      Vomited 3 times; saw a small amt. of light red blood; about 1 cm. Speck mixed in with the fluid   2. ONSET: "When did the vomiting begin?"      About 7:00 AM ; brown liquid this AM, clear this afternoon. 3. FLUIDS: "What fluids or food have you vomited up  today?" "Have you been able to keep any fluids down?"     drank water x 2; vomited shortly after drinking water the 2nd time  4. ABDOMINAL PAIN: "Are your having any abdominal pain?" If yes : "How bad is it and what does it feel like?" (e.g., crampy, dull, intermittent, constant)      Denied pain 5. DIARRHEA: "Is there any diarrhea?" If so, ask: "How many times today?"     One large diarrhea stool this AM 6. CONTACTS: "Is there anyone else in the family with the same symptoms?"      Unknown; was around several people on Saturday night at a festival  7. CAUSE: "What do you think is causing your vomiting?"    Unknown 8. HYDRATION STATUS: "Any signs of dehydration?" (e.g., dry mouth [not only dry lips], too weak to stand) "When did you last urinate?"     Urinated at 12:00 PM today; denied dry mouth; taking sips of water  9. OTHER SYMPTOMS: "Do you have any other symptoms?" (e.g., fever, headache, vertigo, vomiting blood or coffee grounds, recent head injury)    Denied fever/ chills, denied headache, dizziness, or any pain; stomach churning; nauseated   10. PREGNANCY: "Is there any chance you are pregnant?" "When was your last menstrual period?"  N/a  Protocols used: [facility]

## 2018-01-12 DIAGNOSIS — M6281 Muscle weakness (generalized): Secondary | ICD-10-CM | POA: Diagnosis not present

## 2018-01-12 DIAGNOSIS — M25522 Pain in left elbow: Secondary | ICD-10-CM | POA: Diagnosis not present

## 2018-01-12 DIAGNOSIS — M7712 Lateral epicondylitis, left elbow: Secondary | ICD-10-CM | POA: Diagnosis not present

## 2018-01-14 ENCOUNTER — Other Ambulatory Visit: Payer: Self-pay | Admitting: Internal Medicine

## 2018-01-14 DIAGNOSIS — M25522 Pain in left elbow: Secondary | ICD-10-CM | POA: Diagnosis not present

## 2018-01-14 DIAGNOSIS — M7712 Lateral epicondylitis, left elbow: Secondary | ICD-10-CM | POA: Diagnosis not present

## 2018-01-14 DIAGNOSIS — E785 Hyperlipidemia, unspecified: Secondary | ICD-10-CM

## 2018-01-14 DIAGNOSIS — M6281 Muscle weakness (generalized): Secondary | ICD-10-CM | POA: Diagnosis not present

## 2018-01-14 DIAGNOSIS — E118 Type 2 diabetes mellitus with unspecified complications: Secondary | ICD-10-CM

## 2018-01-19 ENCOUNTER — Other Ambulatory Visit: Payer: Self-pay | Admitting: Internal Medicine

## 2018-01-19 DIAGNOSIS — F3176 Bipolar disorder, in full remission, most recent episode depressed: Secondary | ICD-10-CM

## 2018-01-19 DIAGNOSIS — M7712 Lateral epicondylitis, left elbow: Secondary | ICD-10-CM | POA: Diagnosis not present

## 2018-01-19 DIAGNOSIS — M6281 Muscle weakness (generalized): Secondary | ICD-10-CM | POA: Diagnosis not present

## 2018-01-19 DIAGNOSIS — M25522 Pain in left elbow: Secondary | ICD-10-CM | POA: Diagnosis not present

## 2018-01-19 MED ORDER — RISPERIDONE 2 MG PO TABS: 2.0000 mg | ORAL_TABLET | Freq: Two times a day (BID) | ORAL | 1 refills | Status: DC

## 2018-01-19 NOTE — Telephone Encounter (Signed)
Copied from [place] [id number]. Topic: Quick Communication - See Telephone Encounter >> Jan 19, 2018 12:12 PM Conception Chancy, NT wrote: CRM for notification. See Telephone encounter for: 01/19/18.  Shirlee Limerick is calling from [facility] and is requesting a refill on risperiDONE (RISPERDAL) 2 MG tablet. Please advise.   [place], [address] [facility] [address] [place] [address] Phone: [phone] Fax: [phone]

## 2018-01-19 NOTE — Telephone Encounter (Signed)
Risperidone (Risperdal) 2 mg tab refill Last Refill:05/04/17 #180 with 1 refill Last OV: 10/12/17 PCP: Dr. [name] [facility]acy: [facility]

## 2018-01-22 DIAGNOSIS — G4733 Obstructive sleep apnea (adult) (pediatric): Secondary | ICD-10-CM | POA: Diagnosis not present

## 2018-01-25 DIAGNOSIS — M25522 Pain in left elbow: Secondary | ICD-10-CM | POA: Diagnosis not present

## 2018-01-25 DIAGNOSIS — M6281 Muscle weakness (generalized): Secondary | ICD-10-CM | POA: Diagnosis not present

## 2018-01-25 DIAGNOSIS — M7712 Lateral epicondylitis, left elbow: Secondary | ICD-10-CM | POA: Diagnosis not present

## 2018-02-01 DIAGNOSIS — M7712 Lateral epicondylitis, left elbow: Secondary | ICD-10-CM | POA: Diagnosis not present

## 2018-02-01 DIAGNOSIS — M25522 Pain in left elbow: Secondary | ICD-10-CM | POA: Diagnosis not present

## 2018-02-01 DIAGNOSIS — M6281 Muscle weakness (generalized): Secondary | ICD-10-CM | POA: Diagnosis not present

## 2018-02-16 ENCOUNTER — Other Ambulatory Visit: Payer: Self-pay | Admitting: Internal Medicine

## 2018-02-16 DIAGNOSIS — G4730 Sleep apnea, unspecified: Principal | ICD-10-CM

## 2018-02-16 DIAGNOSIS — G4710 Hypersomnia, unspecified: Secondary | ICD-10-CM

## 2018-02-22 DIAGNOSIS — G4733 Obstructive sleep apnea (adult) (pediatric): Secondary | ICD-10-CM | POA: Diagnosis not present

## 2018-02-25 ENCOUNTER — Encounter: Payer: Self-pay | Admitting: Internal Medicine

## 2018-02-25 ENCOUNTER — Other Ambulatory Visit (INDEPENDENT_AMBULATORY_CARE_PROVIDER_SITE_OTHER): Payer: Medicare Other

## 2018-02-25 ENCOUNTER — Ambulatory Visit (INDEPENDENT_AMBULATORY_CARE_PROVIDER_SITE_OTHER): Payer: Medicare Other | Admitting: Internal Medicine

## 2018-02-25 VITALS — BP 124/76 | HR 77 | Temp 98.8°F | Resp 18 | Wt 221.0 lb

## 2018-02-25 DIAGNOSIS — Z0000 Encounter for general adult medical examination without abnormal findings: Secondary | ICD-10-CM

## 2018-02-25 DIAGNOSIS — J449 Chronic obstructive pulmonary disease, unspecified: Secondary | ICD-10-CM

## 2018-02-25 DIAGNOSIS — E118 Type 2 diabetes mellitus with unspecified complications: Secondary | ICD-10-CM | POA: Diagnosis not present

## 2018-02-25 DIAGNOSIS — Z23 Encounter for immunization: Secondary | ICD-10-CM | POA: Diagnosis not present

## 2018-02-25 DIAGNOSIS — R5383 Other fatigue: Secondary | ICD-10-CM

## 2018-02-25 DIAGNOSIS — R351 Nocturia: Secondary | ICD-10-CM

## 2018-02-25 DIAGNOSIS — N401 Enlarged prostate with lower urinary tract symptoms: Secondary | ICD-10-CM | POA: Diagnosis not present

## 2018-02-25 DIAGNOSIS — R3916 Straining to void: Secondary | ICD-10-CM

## 2018-02-25 LAB — CBC WITH DIFFERENTIAL/PLATELET
Basophils Absolute: 0.1 10*3/uL (ref 0.0–0.1)
Basophils Relative: 1.3 % (ref 0.0–3.0)
Eosinophils Absolute: 0.3 10*3/uL (ref 0.0–0.7)
Eosinophils Relative: 2.8 % (ref 0.0–5.0)
HCT: 44.1 % (ref 39.0–52.0)
Hemoglobin: 15.6 g/dL (ref 13.0–17.0)
Lymphocytes Relative: 31.3 % (ref 12.0–46.0)
Lymphs Abs: 3.3 10*3/uL (ref 0.7–4.0)
MCHC: 35.3 g/dL (ref 30.0–36.0)
MCV: 83.7 fl (ref 78.0–100.0)
Monocytes Absolute: 0.8 10*3/uL (ref 0.1–1.0)
Monocytes Relative: 7.8 % (ref 3.0–12.0)
Neutro Abs: 6.1 10*3/uL (ref 1.4–7.7)
Neutrophils Relative %: 56.8 % (ref 43.0–77.0)
Platelets: 176 10*3/uL (ref 150.0–400.0)
RBC: 5.28 Mil/uL (ref 4.22–5.81)
RDW: 13.2 % (ref 11.5–15.5)
WBC: 10.7 10*3/uL — ABNORMAL HIGH (ref 4.0–10.5)

## 2018-02-25 LAB — COMPREHENSIVE METABOLIC PANEL
ALT: 38 U/L (ref 0–53)
AST: 36 U/L (ref 0–37)
Albumin: 4.8 g/dL (ref 3.5–5.2)
Alkaline Phosphatase: 59 U/L (ref 39–117)
BUN: 20 mg/dL (ref 6–23)
CO2: 28 mEq/L (ref 19–32)
Calcium: 10.1 mg/dL (ref 8.4–10.5)
Chloride: 98 mEq/L (ref 96–112)
Creatinine, Ser: 0.82 mg/dL (ref 0.40–1.50)
GFR: 104.27 mL/min (ref >60.00)
Glucose, Bld: 141 mg/dL — ABNORMAL HIGH (ref 70–99)
Potassium: 3.9 mEq/L (ref 3.5–5.1)
Sodium: 135 mEq/L (ref 135–145)
Total Bilirubin: 1 mg/dL (ref 0.2–1.2)
Total Protein: 7.9 g/dL (ref 6.0–8.3)

## 2018-02-25 LAB — POCT GLYCOSYLATED HEMOGLOBIN (HGB A1C): Hemoglobin A1C: 5.4 % (ref 4.0–5.6)

## 2018-02-25 LAB — TSH: TSH: 1.61 u[IU]/mL (ref 0.35–4.50)

## 2018-02-25 MED ORDER — FLUTICASONE-UMECLIDIN-VILANT 100-62.5-25 MCG/INH IN AEPB: 1.0000 | INHALATION_SPRAY | Freq: Every day | RESPIRATORY_TRACT | 1 refills | Status: DC

## 2018-02-25 NOTE — Progress Notes (Addendum)
Subjective:  Patient ID: Jeff Norton, male    DOB: 1965/07/10  Age: 52 y.o. MRN: [id number]  CC: COPD and Annual Exam   HPI Jeff Norton presents for f/up - He continues to complain of nocturia and wants to see a urologist about this.  He also complains of fatigue and weight gain.  He thinks the weight gain is related to drinking stout beers every day of the week.  He continues to complain of intermittent cough productive of clear phlegm with an occasional episode of wheezing and shortness of breath.  He denies chest pain or hemoptysis.  Past Medical History:  Diagnosis Date  . Allergy   . Anxiety   . Asthma   . Bipolar 1 disorder ([place])   . Depression   . GERD (gastroesophageal reflux disease)    Past Surgical History:  Procedure Laterality Date  . CHOLECYSTECTOMY    . CYST REMOVAL HAND  1997  . FRACTURE SURGERY    . GALLBLADDER SURGERY    . SPINE SURGERY      reports that he has been smoking cigarettes. He has a 30.00 pack-year smoking history. He has never used smokeless tobacco. He reports that he drinks about 21.0 standard drinks of alcohol per week. He reports that he does not use drugs. family history includes Cancer in his maternal grandmother; Depression in his sister; Diabetes in his father and sister; Hyperlipidemia in his father and sister; Hypertension in his mother; Mental illness in his maternal grandmother and mother. Allergies  Allergen Reactions  . Azithromycin   . Cleocin [Clindamycin Hcl]   . Oxycodone Nausea And Vomiting    Outpatient Medications Prior to Visit  Medication Sig Dispense Refill  . cyclobenzaprine (FLEXERIL) 10 MG tablet Take 1 tablet (10 mg total) by mouth 3 (three) times daily as needed for muscle spasms. 65 tablet 2  . diclofenac (VOLTAREN) 75 MG EC tablet TK 1 T PO BID 180 tablet 1  . lamoTRIgine (LAMICTAL) 200 MG tablet Take 1 tablet (200 mg total) 2 (two) times daily by mouth. 180 tablet 1  . modafinil (PROVIGIL) 200 MG tablet  TAKE ONE TABLET BY MOUTH ONE TIME DAILY  30 tablet 5  . montelukast (SINGULAIR) 10 MG tablet Take 1 tablet (10 mg total) by mouth at bedtime. 90 tablet 3  . PROAIR RESPICLICK 638 (90 Base) MCG/ACT AEPB INHALE 2 PUFFS INTO THE LUNGS FOUR TIMES DAILY AS NEEDED 1 each 11  . risperiDONE (RISPERDAL) 2 MG tablet Take 1 tablet (2 mg total) by mouth 2 (two) times daily. 180 tablet 1  . rosuvastatin (CRESTOR) 10 MG tablet TAKE 1 TABLET BY MOUTH  DAILY 90 tablet 1  . fluticasone furoate-vilanterol (BREO ELLIPTA) 200-25 MCG/INH AEPB Inhale 1 puff into the lungs daily. 3 each 3  . montelukast (SINGULAIR) 10 MG tablet Take 1 tablet (10 mg total) by mouth at bedtime. 30 tablet 3   No facility-administered medications prior to visit.     ROS Review of Systems  Constitutional: Positive for fatigue and unexpected weight change. Negative for activity change, appetite change and diaphoresis.  HENT: Negative.  Negative for trouble swallowing.   Eyes: Negative for visual disturbance.  Respiratory: Positive for cough, shortness of breath and wheezing. Negative for chest tightness and stridor.   Cardiovascular: Negative for chest pain, palpitations and leg swelling.  Gastrointestinal: Negative for abdominal pain, constipation, diarrhea, nausea and vomiting.  Endocrine: Positive for polyuria. Negative for polydipsia and polyphagia.  Genitourinary: Positive for  frequency. Negative for decreased urine volume, difficulty urinating, dysuria and urgency.  Musculoskeletal: Negative.  Negative for arthralgias.  Skin: Negative for color change, pallor and rash.  Neurological: Negative.  Negative for dizziness, weakness, light-headedness and headaches.  Hematological: Negative for adenopathy. Does not bruise/bleed easily.  Psychiatric/Behavioral: Negative.  Negative for dysphoric mood, sleep disturbance and suicidal ideas. The patient is not nervous/anxious.     Objective:  BP 124/76   Pulse 77   Temp 98.8 F (37.1  C) (Oral)   Resp 18   Wt 221 lb (100.2 kg)   HC 69" (175.3 cm)   SpO2 97%   BMI 32.64 kg/m   BP Readings from Last 3 Encounters:  02/25/18 124/76  10/12/17 114/70  08/07/17 122/72    Wt Readings from Last 3 Encounters:  02/25/18 221 lb (100.2 kg)  10/12/17 207 lb (93.9 kg)  08/07/17 216 lb 1.9 oz (98 kg)    Physical Exam  Constitutional: He is oriented to person, place, and time. No distress.  HENT:  Mouth/Throat: Oropharynx is clear and moist. No oropharyngeal exudate.  Eyes: Conjunctivae are normal. No scleral icterus.  Neck: Normal range of motion. Neck supple. No JVD present. No thyromegaly present.  Cardiovascular: Normal rate, regular rhythm and normal heart sounds. Exam reveals no gallop.  No murmur heard. Pulmonary/Chest: Effort normal. He has no decreased breath sounds. He has wheezes in the right middle field and the left middle field. He has rhonchi in the right middle field and the left middle field. He has no rales.  Genitourinary:  Genitourinary Comments: GU and rectal exams were deferred at his request since he will be seeing a urologist soon.  Musculoskeletal: Normal range of motion. He exhibits no edema, tenderness or deformity.  Lymphadenopathy:    He has no cervical adenopathy.  Neurological: He is alert and oriented to person, place, and time.  Skin: Skin is warm and dry. No rash noted. He is not diaphoretic.  Psychiatric: He has a normal mood and affect. His behavior is normal. Judgment and thought content normal.  Vitals reviewed.   Lab Results  Component Value Date   WBC 10.7 (H) 02/25/2018   HGB 15.6 02/25/2018   HCT 44.1 02/25/2018   PLT 176.0 02/25/2018   GLUCOSE 141 (H) 02/25/2018   CHOL 135 10/12/2017   TRIG 78.0 10/12/2017   HDL 55.00 10/12/2017   LDLDIRECT 102.0 08/19/2016   LDLCALC 65 10/12/2017   ALT 38 02/25/2018   AST 36 02/25/2018   NA 135 02/25/2018   K 3.9 02/25/2018   CL 98 02/25/2018   CREATININE 0.82 02/25/2018   BUN  20 02/25/2018   CO2 28 02/25/2018   TSH 1.61 02/25/2018   PSA 0.43 10/12/2017   HGBA1C 5.4 02/25/2018   MICROALBUR 4.8 (H) 02/19/2017    Ct Cervical Spine Wo Contrast  Result Date: 09/22/2016 CLINICAL DATA:  Cervical radiculitis. Right cervical pain. Left upper extremity pain. EXAM: CT CERVICAL SPINE WITHOUT CONTRAST TECHNIQUE: Multidetector CT imaging of the cervical spine was performed without intravenous contrast. Multiplanar CT image reconstructions were also generated. COMPARISON:  03/28/2011 FINDINGS: Alignment: Normal. Skull base and vertebrae: No acute fracture. No primary bone lesion or focal pathologic process. Soft tissues and spinal canal: No prevertebral fluid or swelling. No visible canal hematoma. Disc levels: Degenerative disc disease with disc height loss at C5-6 and T1-2. Anterior cervical fusion at C6-7 with plate and screw fixation, without hardware failure or complication. Solid osseous bridging across the C6-7 disc  space. Left uncovertebral degenerative changes at C4-5 with left foraminal encroachment. At C5-6 there is a broad-based disc bulge with bilateral uncovertebral degenerative changes and bilateral foraminal narrowing. At C6-7 there is interbody fusion without foraminal stenosis. At C7-T1 there is right uncovertebral degenerative changes with moderate right foraminal stenosis. Upper chest: Lung apices are clear. Other: No fluid collection or hematoma. IMPRESSION: 1. Anterior cervical fusion at C6-7 with solid osseous fusion across the disc space. No foraminal stenosis. 2. Adjacent segment degenerative disc disease at C5-6 with bilateral uncovertebral degenerative changes and bilateral foraminal stenosis. 3. At C7-T1 there is right uncovertebral degenerative changes with moderate right foraminal stenosis. Electronically Signed   By: Kathreen Devoid   On: 09/22/2016 12:51    Assessment & Plan:   Apolonio was seen today for copd and annual exam.  Diagnoses and all orders for this  visit:  Type 2 diabetes mellitus with complication, without long-term current use of insulin (HCC)-his A1c is down to 5.4%.  His blood sugars are adequately well controlled. -     Comprehensive metabolic panel; Future -     POCT HgB A1C  Benign prostatic hyperplasia (BPH) with straining on urination  Routine general medical examination at a health care facility  Need for influenza vaccination -     Flu Vaccine QUAD 6+ mos PF IM (Fluarix Quad PF)  COPD with asthma ([place])- He has persistent symptoms on the LABA/ICS inhaler.  I have asked him to upgrade to a triple inhaler that includes a LAMA.  Hopefully, this will provide better symptom relief. -     Fluticasone-Umeclidin-Vilant (TRELEGY ELLIPTA) 100-62.5-25 MCG/INH AEPB; Inhale 1 puff into the lungs daily.  Other fatigue- Based on his symptoms, exam, and labs this appears to be a benign concern.  He will let me know if he develops any new or worsening symptoms. -     TSH; Future -     Comprehensive metabolic panel; Future -     CBC with Differential/Platelet; Future  Nocturia associated with benign prostatic hyperplasia -     Ambulatory referral to Urology   I have discontinued Allin M. Buzby's fluticasone furoate-vilanterol. I am also having him start on Fluticasone-Umeclidin-Vilant. Additionally, I am having him maintain his PROAIR RESPICLICK, diclofenac, lamoTRIgine, cyclobenzaprine, montelukast, rosuvastatin, risperiDONE, and modafinil.  Meds ordered this encounter  Medications  . Fluticasone-Umeclidin-Vilant (TRELEGY ELLIPTA) 100-62.5-25 MCG/INH AEPB    Sig: Inhale 1 puff into the lungs daily.    Dispense:  90 each    Refill:  1   See AVS for instructions about healthy living and anticipatory guidance.  Follow-up: Return in about 6 months (around 08/26/2018).  Scarlette Calico, MD

## 2018-02-26 ENCOUNTER — Encounter: Payer: Self-pay | Admitting: Internal Medicine

## 2018-02-28 ENCOUNTER — Encounter: Payer: Self-pay | Admitting: Internal Medicine

## 2018-02-28 NOTE — Assessment & Plan Note (Signed)

## 2018-02-28 NOTE — Patient Instructions (Signed)

## 2018-03-03 ENCOUNTER — Encounter: Payer: Self-pay | Admitting: Internal Medicine

## 2018-03-04 ENCOUNTER — Other Ambulatory Visit: Payer: Self-pay | Admitting: Internal Medicine

## 2018-03-04 DIAGNOSIS — M4802 Spinal stenosis, cervical region: Secondary | ICD-10-CM

## 2018-03-04 DIAGNOSIS — M19071 Primary osteoarthritis, right ankle and foot: Secondary | ICD-10-CM

## 2018-03-24 DIAGNOSIS — G4733 Obstructive sleep apnea (adult) (pediatric): Secondary | ICD-10-CM | POA: Diagnosis not present

## 2018-04-01 ENCOUNTER — Encounter: Payer: Self-pay | Admitting: Internal Medicine

## 2018-04-01 ENCOUNTER — Ambulatory Visit (INDEPENDENT_AMBULATORY_CARE_PROVIDER_SITE_OTHER): Payer: Medicare Other | Admitting: Internal Medicine

## 2018-04-01 VITALS — BP 110/80 | HR 66 | Temp 98.5°F | Ht 69.0 in | Wt 231.0 lb

## 2018-04-01 DIAGNOSIS — J410 Simple chronic bronchitis: Secondary | ICD-10-CM

## 2018-04-01 DIAGNOSIS — J029 Acute pharyngitis, unspecified: Secondary | ICD-10-CM | POA: Diagnosis not present

## 2018-04-01 LAB — POCT RAPID STREP A (OFFICE): Rapid Strep A Screen: NEGATIVE

## 2018-04-01 NOTE — Assessment & Plan Note (Signed)
Stable at this time. Reminded that he needs to work on quitting. He does not feel able at this time.

## 2018-04-01 NOTE — Assessment & Plan Note (Signed)
No signs of post nasal drip on exam although this is most likely. Strep test in the office negative. Will refer to ENT for direct visualization given high risk due to smoking.

## 2018-04-01 NOTE — Progress Notes (Signed)
   Subjective:    Patient ID: Jeff Norton, male    DOB: 1965/09/27, 52 y.o.   MRN: [id number]  HPI The patient is a 53 YO man coming in for sore throat for about 2 weeks. Started when he was drinking a coke with excessive carbonation and it felt like he stretched his throat. After that he did have pain for about a day or so. Then it went away. Then it has gradually come back. Given that he is a smoker (1 PPD, 30+ pack years) he is concerned he could have a throat cancer. Denies weight change. No night sweats or chills. Denies pain with swallowing or food getting stuck.   Review of Systems  Constitutional: Negative for activity change, appetite change, chills, fatigue, fever and unexpected weight change.  HENT: Positive for sore throat. Negative for congestion, ear discharge, ear pain, postnasal drip, rhinorrhea, sinus pressure, sinus pain, sneezing, tinnitus, trouble swallowing and voice change.   Respiratory: Negative for cough, chest tightness, shortness of breath and wheezing.   Cardiovascular: Negative.   Gastrointestinal: Negative.   Musculoskeletal: Negative.   Neurological: Negative.       Objective:   Physical Exam  Constitutional: He is oriented to person, place, and time. He appears well-developed and well-nourished.  HENT:  Head: Normocephalic and atraumatic.  Mouth/Throat: Posterior oropharyngeal erythema present.  Eyes: EOM are normal.  Neck: Normal range of motion.  Cardiovascular: Normal rate and regular rhythm.  Pulmonary/Chest: Effort normal and breath sounds normal. No respiratory distress. He has no wheezes. He has no rales.  Abdominal: Soft. Bowel sounds are normal. He exhibits no distension. There is no tenderness. There is no rebound.  Musculoskeletal: He exhibits no edema.  Neurological: He is alert and oriented to person, place, and time. Coordination normal.  Skin: Skin is warm and dry.   Vitals:   04/01/18 1553  BP: 110/80  Pulse: 66  Temp: 98.5 F  (36.9 C)  TempSrc: Oral  SpO2: 96%  Weight: 231 lb (104.8 kg)  Height: 5\' 9"  (1.753 m)      Assessment & Plan:

## 2018-04-01 NOTE — Patient Instructions (Signed)
The strep test is negative.   We will get you in with ENT to check the direct look at the throat.

## 2018-04-23 DIAGNOSIS — G4733 Obstructive sleep apnea (adult) (pediatric): Secondary | ICD-10-CM | POA: Diagnosis not present

## 2018-04-28 ENCOUNTER — Encounter: Payer: Self-pay | Admitting: Internal Medicine

## 2018-04-28 DIAGNOSIS — J449 Chronic obstructive pulmonary disease, unspecified: Secondary | ICD-10-CM

## 2018-04-29 DIAGNOSIS — L570 Actinic keratosis: Secondary | ICD-10-CM | POA: Diagnosis not present

## 2018-04-29 DIAGNOSIS — D225 Melanocytic nevi of trunk: Secondary | ICD-10-CM | POA: Diagnosis not present

## 2018-04-29 DIAGNOSIS — Z85828 Personal history of other malignant neoplasm of skin: Secondary | ICD-10-CM | POA: Diagnosis not present

## 2018-04-29 DIAGNOSIS — L814 Other melanin hyperpigmentation: Secondary | ICD-10-CM | POA: Diagnosis not present

## 2018-04-29 DIAGNOSIS — L821 Other seborrheic keratosis: Secondary | ICD-10-CM | POA: Diagnosis not present

## 2018-04-29 MED ORDER — FLUTICASONE-UMECLIDIN-VILANT 100-62.5-25 MCG/INH IN AEPB: 1.0000 | INHALATION_SPRAY | Freq: Every day | RESPIRATORY_TRACT | 1 refills | Status: DC

## 2018-05-07 DIAGNOSIS — J343 Hypertrophy of nasal turbinates: Secondary | ICD-10-CM | POA: Diagnosis not present

## 2018-05-07 DIAGNOSIS — J342 Deviated nasal septum: Secondary | ICD-10-CM | POA: Diagnosis not present

## 2018-05-07 DIAGNOSIS — J320 Chronic maxillary sinusitis: Secondary | ICD-10-CM | POA: Diagnosis not present

## 2018-05-07 DIAGNOSIS — J370 Chronic laryngitis: Secondary | ICD-10-CM | POA: Diagnosis not present

## 2018-05-07 DIAGNOSIS — J322 Chronic ethmoidal sinusitis: Secondary | ICD-10-CM | POA: Diagnosis not present

## 2018-05-19 ENCOUNTER — Other Ambulatory Visit: Payer: Self-pay | Admitting: Internal Medicine

## 2018-05-19 DIAGNOSIS — F3176 Bipolar disorder, in full remission, most recent episode depressed: Secondary | ICD-10-CM

## 2018-05-19 MED ORDER — LAMOTRIGINE 200 MG PO TABS: 200.0000 mg | ORAL_TABLET | Freq: Two times a day (BID) | ORAL | 1 refills | Status: DC

## 2018-05-21 ENCOUNTER — Ambulatory Visit: Payer: Medicare Other | Admitting: Adult Health

## 2018-05-24 DIAGNOSIS — G4733 Obstructive sleep apnea (adult) (pediatric): Secondary | ICD-10-CM | POA: Diagnosis not present

## 2018-05-25 DIAGNOSIS — G4733 Obstructive sleep apnea (adult) (pediatric): Secondary | ICD-10-CM | POA: Diagnosis not present

## 2018-05-25 DIAGNOSIS — J320 Chronic maxillary sinusitis: Secondary | ICD-10-CM | POA: Diagnosis not present

## 2018-05-25 DIAGNOSIS — J342 Deviated nasal septum: Secondary | ICD-10-CM | POA: Diagnosis not present

## 2018-05-25 DIAGNOSIS — J343 Hypertrophy of nasal turbinates: Secondary | ICD-10-CM | POA: Diagnosis not present

## 2018-05-29 ENCOUNTER — Other Ambulatory Visit: Payer: Self-pay | Admitting: Internal Medicine

## 2018-05-29 DIAGNOSIS — J449 Chronic obstructive pulmonary disease, unspecified: Secondary | ICD-10-CM

## 2018-05-29 DIAGNOSIS — J4520 Mild intermittent asthma, uncomplicated: Secondary | ICD-10-CM

## 2018-05-30 ENCOUNTER — Encounter: Payer: Self-pay | Admitting: Internal Medicine

## 2018-06-23 DIAGNOSIS — G4733 Obstructive sleep apnea (adult) (pediatric): Secondary | ICD-10-CM | POA: Diagnosis not present

## 2018-06-25 ENCOUNTER — Ambulatory Visit (INDEPENDENT_AMBULATORY_CARE_PROVIDER_SITE_OTHER): Payer: Medicare Other | Admitting: Family

## 2018-06-25 ENCOUNTER — Encounter: Payer: Self-pay | Admitting: Family

## 2018-06-25 VITALS — BP 126/80 | HR 94 | Temp 98.4°F | Ht 69.0 in | Wt 225.1 lb

## 2018-06-25 DIAGNOSIS — R6889 Other general symptoms and signs: Secondary | ICD-10-CM | POA: Diagnosis not present

## 2018-06-25 DIAGNOSIS — H1033 Unspecified acute conjunctivitis, bilateral: Secondary | ICD-10-CM | POA: Diagnosis not present

## 2018-06-25 MED ORDER — MOXIFLOXACIN HCL 0.5 % OP SOLN: 1.0000 [drp] | Freq: Three times a day (TID) | OPHTHALMIC | 0 refills | Status: DC

## 2018-06-25 NOTE — Progress Notes (Signed)
Jeff Norton is a 53 y.o. male with the following history as recorded in [employer]:  Patient Active Problem List   Diagnosis Date Noted  . Sore throat 04/01/2018  . Nocturia associated with benign prostatic hyperplasia 02/25/2018  . Bipolar 1 disorder, depressed, full remission ([place]) 11/20/2016  . Spinal stenosis in cervical region 09/18/2016  . Sleep apnea with hypersomnolence 08/19/2016  . Fatigue 04/14/2016  . Bile salt-induced diarrhea 01/02/2016  . Chronic pansinusitis 01/01/2016  . COPD with asthma ([place]) 11/07/2015  . Type II diabetes mellitus with manifestations ([place]) 11/07/2015  . Routine general medical examination at a health care facility 11/07/2015  . Obesity (BMI 30.0-34.9) 02/24/2012  . Smokers' cough ([place]) 02/24/2012  . Osteoarthritis of ankle and foot 10/08/2007  . Hyperlipidemia LDL goal <100 04/16/2007  . Allergic rhinitis due to pollen 04/16/2007  . Asthma, mild intermittent, poorly controlled 04/16/2007  . OSA on CPAP 04/16/2007    Current Outpatient Medications  Medication Sig Dispense Refill  . cyclobenzaprine (FLEXERIL) 10 MG tablet Take 1 tablet (10 mg total) by mouth 3 (three) times daily as needed for muscle spasms. 65 tablet 2  . diclofenac (VOLTAREN) 75 MG EC tablet TAKE 1 TABLET BY MOUTH TWO  TIMES DAILY 180 tablet 1  . esomeprazole (NEXIUM) 20 MG capsule TK 1 C PO QD  3  . fluticasone furoate-vilanterol (BREO ELLIPTA) 200-25 MCG/INH AEPB Inhale into the lungs.    . Fluticasone-Umeclidin-Vilant (TRELEGY ELLIPTA) 100-62.5-25 MCG/INH AEPB Inhale 1 puff into the lungs daily. 90 each 1  . lamoTRIgine (LAMICTAL) 200 MG tablet Take 1 tablet (200 mg total) by mouth 2 (two) times daily. 180 tablet 1  . modafinil (PROVIGIL) 200 MG tablet TAKE ONE TABLET BY MOUTH ONE TIME DAILY  30 tablet 5  . montelukast (SINGULAIR) 10 MG tablet Take 1 tablet (10 mg total) by mouth at bedtime. 90 tablet 3  . Omega-3 Fatty Acids (FISH OIL) 1000 MG CAPS Take 2 capsules by mouth.     . oseltamivir (TAMIFLU) 75 MG capsule Take 75 mg by mouth 2 (two) times daily.    Marland Kitchen PROAIR RESPICLICK 962 (90 Base) MCG/ACT AEPB INHALE 2 PUFFS INTO THE LUNGS FOUR TIMES DAILY AS NEEDED 1 each 5  . risperiDONE (RISPERDAL) 2 MG tablet Take 1 tablet (2 mg total) by mouth 2 (two) times daily. 180 tablet 1  . rosuvastatin (CRESTOR) 10 MG tablet TAKE 1 TABLET BY MOUTH  DAILY 90 tablet 1  . moxifloxacin (VIGAMOX) 0.5 % ophthalmic solution Place 1 drop into both eyes 3 (three) times daily. 3 mL 0  . moxifloxacin (VIGAMOX) 0.5 % ophthalmic solution Place 1 drop into both eyes 3 (three) times daily. 3 mL 0   No current facility-administered medications for this visit.     Allergies: Azithromycin; Cleocin [clindamycin hcl]; and Oxycodone  Past Medical History:  Diagnosis Date  . Allergy   . Anxiety   . Asthma   . Bipolar 1 disorder ([place])   . Depression   . GERD (gastroesophageal reflux disease)     Past Surgical History:  Procedure Laterality Date  . CHOLECYSTECTOMY    . CYST REMOVAL HAND  1997  . FRACTURE SURGERY    . GALLBLADDER SURGERY    . SPINE SURGERY      Family History  Problem Relation Age of Onset  . Hypertension Mother   . Mental illness Mother   . Diabetes Father   . Hyperlipidemia Father   . Depression Sister   .  Diabetes Sister   . Hyperlipidemia Sister   . Cancer Maternal Grandmother   . Mental illness Maternal Grandmother     Social History   Tobacco Use  . Smoking status: Current Every Day Smoker    Packs/day: 1.00    Years: 30.00    Pack years: 30.00    Types: Cigarettes  . Smokeless tobacco: Never Used  . Tobacco comment: 5cigs a day as of 05/21/17 ep  Substance Use Topics  . Alcohol use: Yes    Alcohol/week: 21.0 standard drinks    Types: 21 Cans of beer per week    Subjective:  Patient presents with 1 day history of pink eye/ both eyes are affected; + irritate eyes; is currently being treated for flu- on day 4 of Tamiflu; + persisting cough/ nasal  congestion;    Objective:  Vitals:   06/25/18 1418  BP: 126/80  Pulse: 94  Temp: 98.4 F (36.9 C)  TempSrc: Oral  SpO2: 95%  Weight: 225 lb 1.3 oz (102.1 kg)  Height: 5\' 9"  (1.753 m)    General: Well developed, well nourished, in no acute distress  Skin : Warm and dry.  Head: Normocephalic and atraumatic  Eyes: Sclera and conjunctiva erythematous; pupils round and reactive to light; extraocular movements intact  Ears: External normal; canals clear; tympanic membranes normal  Oropharynx: Pink, supple. Aphthous ulcer noted in roof of mouth Neck: Supple without thyromegaly, adenopathy  Lungs: Respirations unlabored; clear to auscultation bilaterally without wheeze, rales, rhonchi  CVS exam: normal rate and regular rhythm.  Neurologic: Alert and oriented; speech intact; face symmetrical; moves all extremities well; CNII-XII intact without focal deficit   Assessment:  1. Acute bacterial conjunctivitis of both eyes   2. Flu-like symptoms     Plan:  1. Rx for Vigamox 1 gtt tid to each eye; reassurance; 2. Complete Tamiflu as prescribed; reassurance about aphthous ulcer noted in mouth; increase fluids, rest and follow-up worse, no better.    No follow-ups on file.  No orders of the defined types were placed in this encounter.   Requested Prescriptions   Signed Prescriptions Disp Refills  . moxifloxacin (VIGAMOX) 0.5 % ophthalmic solution 3 mL 0    Sig: Place 1 drop into both eyes 3 (three) times daily.  Marland Kitchen moxifloxacin (VIGAMOX) 0.5 % ophthalmic solution 3 mL 0    Sig: Place 1 drop into both eyes 3 (three) times daily.

## 2018-06-26 ENCOUNTER — Other Ambulatory Visit: Payer: Self-pay | Admitting: Internal Medicine

## 2018-06-26 DIAGNOSIS — E785 Hyperlipidemia, unspecified: Secondary | ICD-10-CM

## 2018-06-26 DIAGNOSIS — E118 Type 2 diabetes mellitus with unspecified complications: Secondary | ICD-10-CM

## 2018-06-30 ENCOUNTER — Ambulatory Visit (INDEPENDENT_AMBULATORY_CARE_PROVIDER_SITE_OTHER): Payer: Medicare Other | Admitting: Nurse Practitioner

## 2018-06-30 ENCOUNTER — Encounter: Payer: Self-pay | Admitting: Nurse Practitioner

## 2018-06-30 VITALS — BP 120/76 | HR 76 | Temp 98.5°F | Ht 69.0 in | Wt 231.0 lb

## 2018-06-30 DIAGNOSIS — J45901 Unspecified asthma with (acute) exacerbation: Secondary | ICD-10-CM | POA: Diagnosis not present

## 2018-06-30 DIAGNOSIS — J40 Bronchitis, not specified as acute or chronic: Secondary | ICD-10-CM

## 2018-06-30 MED ORDER — PREDNISONE 20 MG PO TABS: 40.0000 mg | ORAL_TABLET | Freq: Every day | ORAL | 0 refills | Status: DC

## 2018-06-30 MED ORDER — AMOXICILLIN 500 MG PO TABS: 1000.0000 mg | ORAL_TABLET | Freq: Three times a day (TID) | ORAL | 0 refills | Status: DC

## 2018-06-30 MED ORDER — PROMETHAZINE-DM 6.25-15 MG/5ML PO SYRP: 5.0000 mL | ORAL_SOLUTION | Freq: Four times a day (QID) | ORAL | 0 refills | Status: DC | PRN

## 2018-06-30 NOTE — Progress Notes (Signed)
Jeff Norton is a 53 y.o. male with the following history as recorded in [employer]:  Patient Active Problem List   Diagnosis Date Noted  . Sore throat 04/01/2018  . Nocturia associated with benign prostatic hyperplasia 02/25/2018  . Bipolar 1 disorder, depressed, full remission ([place]) 11/20/2016  . Spinal stenosis in cervical region 09/18/2016  . Sleep apnea with hypersomnolence 08/19/2016  . Fatigue 04/14/2016  . Bile salt-induced diarrhea 01/02/2016  . Chronic pansinusitis 01/01/2016  . COPD with asthma ([place]) 11/07/2015  . Type II diabetes mellitus with manifestations ([place]) 11/07/2015  . Routine general medical examination at a health care facility 11/07/2015  . Obesity (BMI 30.0-34.9) 02/24/2012  . Smokers' cough ([place]) 02/24/2012  . Osteoarthritis of ankle and foot 10/08/2007  . Hyperlipidemia LDL goal <100 04/16/2007  . Allergic rhinitis due to pollen 04/16/2007  . Asthma, mild intermittent, poorly controlled 04/16/2007  . OSA on CPAP 04/16/2007    Current Outpatient Medications  Medication Sig Dispense Refill  . cyclobenzaprine (FLEXERIL) 10 MG tablet Take 1 tablet (10 mg total) by mouth 3 (three) times daily as needed for muscle spasms. 65 tablet 2  . diclofenac (VOLTAREN) 75 MG EC tablet TAKE 1 TABLET BY MOUTH TWO  TIMES DAILY 180 tablet 1  . esomeprazole (NEXIUM) 20 MG capsule TK 1 C PO QD  3  . fluticasone furoate-vilanterol (BREO ELLIPTA) 200-25 MCG/INH AEPB Inhale into the lungs.    . Fluticasone-Umeclidin-Vilant (TRELEGY ELLIPTA) 100-62.5-25 MCG/INH AEPB Inhale 1 puff into the lungs daily. 90 each 1  . lamoTRIgine (LAMICTAL) 200 MG tablet Take 1 tablet (200 mg total) by mouth 2 (two) times daily. 180 tablet 1  . modafinil (PROVIGIL) 200 MG tablet TAKE ONE TABLET BY MOUTH ONE TIME DAILY  30 tablet 5  . montelukast (SINGULAIR) 10 MG tablet Take 1 tablet (10 mg total) by mouth at bedtime. 90 tablet 3  . moxifloxacin (VIGAMOX) 0.5 % ophthalmic solution Place 1 drop into  both eyes 3 (three) times daily. 3 mL 0  . moxifloxacin (VIGAMOX) 0.5 % ophthalmic solution Place 1 drop into both eyes 3 (three) times daily. 3 mL 0  . Omega-3 Fatty Acids (FISH OIL) 1000 MG CAPS Take 2 capsules by mouth.    Marland Kitchen PROAIR RESPICLICK 588 (90 Base) MCG/ACT AEPB INHALE 2 PUFFS INTO THE LUNGS FOUR TIMES DAILY AS NEEDED 1 each 5  . risperiDONE (RISPERDAL) 2 MG tablet Take 1 tablet (2 mg total) by mouth 2 (two) times daily. 180 tablet 1  . rosuvastatin (CRESTOR) 10 MG tablet TAKE 1 TABLET BY MOUTH  DAILY 90 tablet 1  . amoxicillin (AMOXIL) 500 MG tablet Take 2 tablets (1,000 mg total) by mouth 3 (three) times daily. 30 tablet 0  . predniSONE (DELTASONE) 20 MG tablet Take 2 tablets (40 mg total) by mouth daily with breakfast. 10 tablet 0  . promethazine-dextromethorphan (PROMETHAZINE-DM) 6.25-15 MG/5ML syrup Take 5 mLs by mouth 4 (four) times daily as needed for cough. 118 mL 0   No current facility-administered medications for this visit.     Allergies: Azithromycin; Cleocin [clindamycin hcl]; and Oxycodone  Past Medical History:  Diagnosis Date  . Allergy   . Anxiety   . Asthma   . Bipolar 1 disorder ([place])   . Depression   . GERD (gastroesophageal reflux disease)     Past Surgical History:  Procedure Laterality Date  . CHOLECYSTECTOMY    . CYST REMOVAL HAND  1997  . FRACTURE SURGERY    . GALLBLADDER  SURGERY    . SPINE SURGERY      Family History  Problem Relation Age of Onset  . Hypertension Mother   . Mental illness Mother   . Diabetes Father   . Hyperlipidemia Father   . Depression Sister   . Diabetes Sister   . Hyperlipidemia Sister   . Cancer Maternal Grandmother   . Mental illness Maternal Grandmother     Social History   Tobacco Use  . Smoking status: Current Every Day Smoker    Packs/day: 1.00    Years: 30.00    Pack years: 30.00    Types: Cigarettes  . Smokeless tobacco: Never Used  . Tobacco comment: 5cigs a day as of 05/21/17 ep  Substance Use  Topics  . Alcohol use: Yes    Alcohol/week: 21.0 standard drinks    Types: 21 Cans of beer per week     Subjective:  Jeff Norton is here today requesting follow up of cough/cold symptoms. He was seen at [facility] last Tuesday, started on tamiflu for flu like symptoms, completed the tamiflu this weekend and he is no longer having fevers, chllls, body aches but now with sore throat, sinus/ear pressure, nasal congestion, productive cough, green sputum, just not feeling well. No syncope, confusion, cp, sob, abdominal pain, n/v/d. History of bronchitis. Current smoker.  ROS- See HPI  Objective:  Vitals:   06/30/18 1617  BP: 120/76  Pulse: 76  Temp: 98.5 F (36.9 C)  TempSrc: Oral  SpO2: 95%  Weight: 231 lb (104.8 kg)  Height: 5\' 9"  (1.753 m)    General: Well developed, well nourished, in no acute distress  Skin : Warm and dry.  Head: Normocephalic and atraumatic  Eyes: Sclera and conjunctiva clear; pupils round and reactive to light; extraocular movements intact  Ears: External normal; canals clear; tympanic membranes mildly erythematous, bulging bilaterally Oropharynx: Pink, supple. No suspicious lesions  Neck: Supple without thyromegaly, adenopathy  Lungs: Respirations unlabored; diminished bilaterally, Coarse cough CVS exam: normal rate and regular rhythm.  Extremities: No edema, cyanosis, clubbing  Vessels: Symmetric bilaterally  Neurologic: Alert and oriented; speech intact; face symmetrical; moves all extremities well; CNII-XII intact without focal deficit  Psychiatric: Normal mood and affect.  Assessment:  1. Bronchitis   2. Exacerbation of asthma, unspecified asthma severity, unspecified whether persistent     Plan:   Due to duration, worsening symptoms will treat with antibiotic, course, concern for bronchitis vs pneumonia, ?ear infection, antibiotic allergies noted Amoxicillin course sent- medication dosing and side effects dicussed Patient also requests steroid  course and cough syrup, very reasonable requests, prednisone and promethazine/dextromethorphan sent- med dosing, side effects discussed Home management, red flags and return precautions including when to seek immediate care discussed and printed on AVS He will f/u for new,, worsening symptoms or if symptoms do not improve with prescribed treatment  No follow-ups on file.  No orders of the defined types were placed in this encounter.   Requested Prescriptions   Signed Prescriptions Disp Refills  . amoxicillin (AMOXIL) 500 MG tablet 30 tablet 0    Sig: Take 2 tablets (1,000 mg total) by mouth 3 (three) times daily.  . predniSONE (DELTASONE) 20 MG tablet 10 tablet 0    Sig: Take 2 tablets (40 mg total) by mouth daily with breakfast.  . promethazine-dextromethorphan (PROMETHAZINE-DM) 6.25-15 MG/5ML syrup 118 mL 0    Sig: Take 5 mLs by mouth 4 (four) times daily as needed for cough.

## 2018-06-30 NOTE — Patient Instructions (Signed)
Complete antibiotic and steroid course as prescribed  Promethazine cough syrup as needed, this medication can cause drowsiness. Please do not drink alcohol or operate machinery when you take this medication.  Please follow up for fevers over 101, if your symptoms get worse, or if your symptoms dont improve with the antibiotic.   Acute Bronchitis, Adult Acute bronchitis is when air tubes (bronchi) in the lungs suddenly get swollen. The condition can make it hard to breathe. It can also cause these symptoms:  A cough.  Coughing up clear, yellow, or green mucus.  Wheezing.  Chest congestion.  Shortness of breath.  A fever.  Body aches.  Chills.  A sore throat. Follow these instructions at home:  Medicines  Take over-the-counter and prescription medicines only as told by your doctor.  If you were prescribed an antibiotic medicine, take it as told by your doctor. Do not stop taking the antibiotic even if you start to feel better. General instructions  Rest.  Drink enough fluids to keep your pee (urine) pale yellow.  Avoid smoking and secondhand smoke. If you smoke and you need help quitting, ask your doctor. Quitting will help your lungs heal faster.  Use an inhaler, cool mist vaporizer, or humidifier as told by your doctor.  Keep all follow-up visits as told by your doctor. This is important. How is this prevented? To lower your risk of getting this condition again:  Wash your hands often with soap and water. If you cannot use soap and water, use hand sanitizer.  Avoid contact with people who have cold symptoms.  Try not to touch your hands to your mouth, nose, or eyes.  Make sure to get the flu shot every year. Contact a doctor if:  Your symptoms do not get better in 2 weeks. Get help right away if:  You cough up blood.  You have chest pain.  You have very bad shortness of breath.  You become dehydrated.  You faint (pass out) or keep feeling like you  are going to pass out.  You keep throwing up (vomiting).  You have a very bad headache.  Your fever or chills gets worse. This information is not intended to replace advice given to you by your health care provider. Make sure you discuss any questions you have with your health care provider. Document Released: 11/19/2007 Document Revised: 01/14/2017 Document Reviewed: 11/21/2015 Elsevier Interactive Patient Education  2019 [employer].

## 2018-07-16 ENCOUNTER — Encounter: Payer: Self-pay | Admitting: Internal Medicine

## 2018-07-23 DIAGNOSIS — G4733 Obstructive sleep apnea (adult) (pediatric): Secondary | ICD-10-CM | POA: Diagnosis not present

## 2018-08-04 DIAGNOSIS — J342 Deviated nasal septum: Secondary | ICD-10-CM | POA: Diagnosis not present

## 2018-08-04 DIAGNOSIS — J322 Chronic ethmoidal sinusitis: Secondary | ICD-10-CM | POA: Diagnosis not present

## 2018-08-04 DIAGNOSIS — J320 Chronic maxillary sinusitis: Secondary | ICD-10-CM | POA: Diagnosis not present

## 2018-08-04 DIAGNOSIS — J343 Hypertrophy of nasal turbinates: Secondary | ICD-10-CM | POA: Diagnosis not present

## 2018-08-20 ENCOUNTER — Other Ambulatory Visit: Payer: Self-pay | Admitting: Internal Medicine

## 2018-08-20 DIAGNOSIS — J449 Chronic obstructive pulmonary disease, unspecified: Secondary | ICD-10-CM

## 2018-08-23 DIAGNOSIS — G4733 Obstructive sleep apnea (adult) (pediatric): Secondary | ICD-10-CM | POA: Diagnosis not present

## 2018-08-30 ENCOUNTER — Other Ambulatory Visit: Payer: Self-pay | Admitting: Internal Medicine

## 2018-08-30 DIAGNOSIS — M19071 Primary osteoarthritis, right ankle and foot: Secondary | ICD-10-CM

## 2018-08-30 DIAGNOSIS — M4802 Spinal stenosis, cervical region: Secondary | ICD-10-CM

## 2018-08-30 DIAGNOSIS — J301 Allergic rhinitis due to pollen: Secondary | ICD-10-CM

## 2018-08-30 DIAGNOSIS — F3176 Bipolar disorder, in full remission, most recent episode depressed: Secondary | ICD-10-CM

## 2018-09-03 ENCOUNTER — Other Ambulatory Visit: Payer: Self-pay | Admitting: Internal Medicine

## 2018-09-03 DIAGNOSIS — M4802 Spinal stenosis, cervical region: Secondary | ICD-10-CM

## 2018-09-03 MED ORDER — CYCLOBENZAPRINE HCL 10 MG PO TABS: 10.0000 mg | ORAL_TABLET | Freq: Three times a day (TID) | ORAL | 2 refills | Status: DC | PRN

## 2018-09-08 DIAGNOSIS — H61002 Unspecified perichondritis of left external ear: Secondary | ICD-10-CM | POA: Diagnosis not present

## 2018-09-08 DIAGNOSIS — D485 Neoplasm of uncertain behavior of skin: Secondary | ICD-10-CM | POA: Diagnosis not present

## 2018-09-10 ENCOUNTER — Other Ambulatory Visit: Payer: Self-pay | Admitting: Internal Medicine

## 2018-09-10 DIAGNOSIS — G4730 Sleep apnea, unspecified: Principal | ICD-10-CM

## 2018-09-10 DIAGNOSIS — G4710 Hypersomnia, unspecified: Secondary | ICD-10-CM

## 2018-09-20 ENCOUNTER — Telehealth: Payer: Self-pay | Admitting: Internal Medicine

## 2018-09-20 NOTE — Telephone Encounter (Signed)
Copied from [place] [id number]. Topic: Quick Communication - See Telephone Encounter >> Sep 20, 2018 10:09 AM Blase Mess A wrote: CRM for notification. See Telephone encounter for: 09/20/18. Patient is calling because he has a DNR on file and he would like to revoke because of the [place] Virus.  He is requesting information on how he can revoke this? Please advise. [phone] (H)

## 2018-09-21 ENCOUNTER — Encounter: Payer: Self-pay | Admitting: Internal Medicine

## 2018-09-21 NOTE — Telephone Encounter (Signed)
Pt is concerned with COVID that this be taken care of. This message got routed evidently to the wrong office yesterday. Please FU and confirm with pt at his request that this will be put in place.

## 2018-09-22 DIAGNOSIS — G4733 Obstructive sleep apnea (adult) (pediatric): Secondary | ICD-10-CM | POA: Diagnosis not present

## 2018-09-22 NOTE — Telephone Encounter (Signed)
Yes, I have sent him an e-mail letting him know that we will remove the DNR from his medical records. Do you know who in IT or medical records can remove the DNR. Do you know if his EPIC banner shows a DNR status?  TJ

## 2018-09-22 NOTE — Telephone Encounter (Signed)
Do you know how to revoke a DNR that is on file?

## 2018-09-28 ENCOUNTER — Encounter: Payer: Self-pay | Admitting: Internal Medicine

## 2018-09-29 ENCOUNTER — Encounter: Payer: Self-pay | Admitting: Internal Medicine

## 2018-09-29 ENCOUNTER — Ambulatory Visit (INDEPENDENT_AMBULATORY_CARE_PROVIDER_SITE_OTHER): Payer: Medicare Other | Admitting: Internal Medicine

## 2018-09-29 DIAGNOSIS — F411 Generalized anxiety disorder: Secondary | ICD-10-CM | POA: Diagnosis not present

## 2018-09-29 MED ORDER — LORAZEPAM 1 MG PO TABS: 1.0000 mg | ORAL_TABLET | Freq: Two times a day (BID) | ORAL | 1 refills | Status: DC | PRN

## 2018-09-29 NOTE — Progress Notes (Signed)
Virtual Visit via Video Note  I connected with Jeff Norton on 09/30/18 at  3:30 PM EDT by a video enabled telemedicine application and verified that I am speaking with the correct person using two identifiers.   I discussed the limitations of evaluation and management by telemedicine and the availability of in person appointments. The patient expressed understanding and agreed to proceed.  History of Present Illness: He checked in for video visit.  He was not willing to come in because of the COVID-19 pandemic.  He complains of a one-month history of worsening anxiety, feeling panicky, and having trouble sleeping.  He has been taking his girlfriend's supply of Ativan 1.0 mg about twice a day.  He wants to get his own prescription for the Ativan.  He denies crying spells, anhedonia, SI, HI, feeling worthless, helpless, or hopeless.  He is still working full-time and does not relate any mood disorders or thought disorders.  He is compliant with all of his other meds.    Observations/Objective: On the video he was slightly anxious but otherwise calm, cooperative, and appropriate.  His speech was fluent and normal.  He was in no acute distress.   Lab Results  Component Value Date   WBC 10.7 (H) 02/25/2018   HGB 15.6 02/25/2018   HCT 44.1 02/25/2018   PLT 176.0 02/25/2018   GLUCOSE 141 (H) 02/25/2018   CHOL 135 10/12/2017   TRIG 78.0 10/12/2017   HDL 55.00 10/12/2017   LDLDIRECT 102.0 08/19/2016   LDLCALC 65 10/12/2017   ALT 38 02/25/2018   AST 36 02/25/2018   NA 135 02/25/2018   K 3.9 02/25/2018   CL 98 02/25/2018   CREATININE 0.82 02/25/2018   BUN 20 02/25/2018   CO2 28 02/25/2018   TSH 1.61 02/25/2018   PSA 0.43 10/12/2017   HGBA1C 5.4 02/25/2018   MICROALBUR 4.8 (H) 02/19/2017     Assessment and Plan: He has developed a generalized anxiety disorder which has been exacerbated by the recent pandemic.  He is already been taking Ativan and wants to continue taking it for another  month or 2.  I have ordered an RX for Ativan.   Follow Up Instructions: He will let me know if he develops any new or worsening symptoms.  He is committed to not taking the Ativan for more than a month or 2.  He agrees to be compliant with his other meds.    I discussed the assessment and treatment plan with the patient. The patient was provided an opportunity to ask questions and all were answered. The patient agreed with the plan and demonstrated an understanding of the instructions.   The patient was advised to call back or seek an in-person evaluation if the symptoms worsen or if the condition fails to improve as anticipated.  I provided 25 minutes of non-face-to-face time during this encounter.   Scarlette Calico, MD

## 2018-09-30 ENCOUNTER — Encounter: Payer: Self-pay | Admitting: Internal Medicine

## 2018-10-21 ENCOUNTER — Encounter: Payer: Self-pay | Admitting: Internal Medicine

## 2018-10-22 DIAGNOSIS — G4733 Obstructive sleep apnea (adult) (pediatric): Secondary | ICD-10-CM | POA: Diagnosis not present

## 2018-10-23 ENCOUNTER — Other Ambulatory Visit: Payer: Self-pay | Admitting: Internal Medicine

## 2018-10-25 ENCOUNTER — Other Ambulatory Visit: Payer: Self-pay | Admitting: Internal Medicine

## 2018-12-02 ENCOUNTER — Other Ambulatory Visit (INDEPENDENT_AMBULATORY_CARE_PROVIDER_SITE_OTHER): Payer: Medicare Other

## 2018-12-02 ENCOUNTER — Encounter: Payer: Self-pay | Admitting: Internal Medicine

## 2018-12-02 ENCOUNTER — Ambulatory Visit (INDEPENDENT_AMBULATORY_CARE_PROVIDER_SITE_OTHER): Payer: Medicare Other | Admitting: Internal Medicine

## 2018-12-02 ENCOUNTER — Other Ambulatory Visit: Payer: Self-pay

## 2018-12-02 VITALS — BP 128/74 | HR 70 | Temp 98.2°F | Resp 16 | Ht 69.0 in | Wt 226.0 lb

## 2018-12-02 DIAGNOSIS — R351 Nocturia: Secondary | ICD-10-CM | POA: Diagnosis not present

## 2018-12-02 DIAGNOSIS — E118 Type 2 diabetes mellitus with unspecified complications: Secondary | ICD-10-CM

## 2018-12-02 DIAGNOSIS — E785 Hyperlipidemia, unspecified: Secondary | ICD-10-CM | POA: Diagnosis not present

## 2018-12-02 DIAGNOSIS — R0600 Dyspnea, unspecified: Secondary | ICD-10-CM

## 2018-12-02 DIAGNOSIS — N401 Enlarged prostate with lower urinary tract symptoms: Secondary | ICD-10-CM

## 2018-12-02 DIAGNOSIS — R0609 Other forms of dyspnea: Secondary | ICD-10-CM

## 2018-12-02 DIAGNOSIS — Z0000 Encounter for general adult medical examination without abnormal findings: Secondary | ICD-10-CM | POA: Diagnosis not present

## 2018-12-02 DIAGNOSIS — R9431 Abnormal electrocardiogram [ECG] [EKG]: Secondary | ICD-10-CM

## 2018-12-02 DIAGNOSIS — G4733 Obstructive sleep apnea (adult) (pediatric): Secondary | ICD-10-CM | POA: Diagnosis not present

## 2018-12-02 DIAGNOSIS — E781 Pure hyperglyceridemia: Secondary | ICD-10-CM

## 2018-12-02 DIAGNOSIS — J410 Simple chronic bronchitis: Secondary | ICD-10-CM

## 2018-12-02 LAB — LIPID PANEL
Cholesterol: 111 mg/dL (ref 0–200)
HDL: 40.4 mg/dL (ref >39.00)
NonHDL: 70.5
Total CHOL/HDL Ratio: 3
Triglycerides: 212 mg/dL — ABNORMAL HIGH (ref 0.0–149.0)
VLDL: 42.4 mg/dL — ABNORMAL HIGH (ref 0.0–40.0)

## 2018-12-02 LAB — CBC WITH DIFFERENTIAL/PLATELET
Basophils Absolute: 0.1 10*3/uL (ref 0.0–0.1)
Basophils Relative: 0.8 % (ref 0.0–3.0)
Eosinophils Absolute: 0.3 10*3/uL (ref 0.0–0.7)
Eosinophils Relative: 2.7 % (ref 0.0–5.0)
HCT: 40.2 % (ref 39.0–52.0)
Hemoglobin: 13.7 g/dL (ref 13.0–17.0)
Lymphocytes Relative: 30.5 % (ref 12.0–46.0)
Lymphs Abs: 3 10*3/uL (ref 0.7–4.0)
MCHC: 34.2 g/dL (ref 30.0–36.0)
MCV: 85.5 fl (ref 78.0–100.0)
Monocytes Absolute: 0.7 10*3/uL (ref 0.1–1.0)
Monocytes Relative: 7.1 % (ref 3.0–12.0)
Neutro Abs: 5.8 10*3/uL (ref 1.4–7.7)
Neutrophils Relative %: 58.9 % (ref 43.0–77.0)
Platelets: 151 10*3/uL (ref 150.0–400.0)
RBC: 4.7 Mil/uL (ref 4.22–5.81)
RDW: 13.2 % (ref 11.5–15.5)
WBC: 9.9 10*3/uL (ref 4.0–10.5)

## 2018-12-02 LAB — MICROALBUMIN / CREATININE URINE RATIO
Creatinine,U: 135.9 mg/dL
Microalb Creat Ratio: 4.2 mg/g (ref 0.0–30.0)
Microalb, Ur: 5.7 mg/dL — ABNORMAL HIGH (ref 0.0–1.9)

## 2018-12-02 LAB — COMPREHENSIVE METABOLIC PANEL
ALT: 29 U/L (ref 0–53)
AST: 23 U/L (ref 0–37)
Albumin: 4.7 g/dL (ref 3.5–5.2)
Alkaline Phosphatase: 55 U/L (ref 39–117)
BUN: 15 mg/dL (ref 6–23)
CO2: 27 mEq/L (ref 19–32)
Calcium: 9.8 mg/dL (ref 8.4–10.5)
Chloride: 102 mEq/L (ref 96–112)
Creatinine, Ser: 0.77 mg/dL (ref 0.40–1.50)
GFR: 105.18 mL/min (ref >60.00)
Glucose, Bld: 92 mg/dL (ref 70–99)
Potassium: 3.8 mEq/L (ref 3.5–5.1)
Sodium: 139 mEq/L (ref 135–145)
Total Bilirubin: 0.9 mg/dL (ref 0.2–1.2)
Total Protein: 7.1 g/dL (ref 6.0–8.3)

## 2018-12-02 LAB — URINALYSIS, ROUTINE W REFLEX MICROSCOPIC
Bilirubin Urine: NEGATIVE
Hgb urine dipstick: NEGATIVE
Ketones, ur: NEGATIVE
Leukocytes,Ua: NEGATIVE
Nitrite: NEGATIVE
RBC / HPF: NONE SEEN (ref 0–?)
Specific Gravity, Urine: >=1.03 — AB (ref 1.000–1.030)
Total Protein, Urine: NEGATIVE
Urine Glucose: NEGATIVE
Urobilinogen, UA: 0.2 (ref 0.0–1.0)
pH: 6 (ref 5.0–8.0)

## 2018-12-02 LAB — PSA: PSA: 0.6 ng/mL (ref 0.10–4.00)

## 2018-12-02 LAB — BRAIN NATRIURETIC PEPTIDE: Pro B Natriuretic peptide (BNP): 40 pg/mL (ref 0.0–100.0)

## 2018-12-02 LAB — LDL CHOLESTEROL, DIRECT: Direct LDL: 59 mg/dL

## 2018-12-02 LAB — HEMOGLOBIN A1C: Hgb A1c MFr Bld: 6 % (ref 4.6–6.5)

## 2018-12-02 LAB — TROPONIN I: TNIDX: 0 ug/l (ref 0.00–0.06)

## 2018-12-02 LAB — TSH: TSH: 1.61 u[IU]/mL (ref 0.35–4.50)

## 2018-12-02 MED ORDER — ROSUVASTATIN CALCIUM 10 MG PO TABS: 10.0000 mg | ORAL_TABLET | Freq: Every day | ORAL | 1 refills | Status: DC

## 2018-12-02 MED ORDER — OMEGA-3-ACID ETHYL ESTERS 1 G PO CAPS: 2.0000 g | ORAL_CAPSULE | Freq: Two times a day (BID) | ORAL | 1 refills | Status: DC

## 2018-12-02 NOTE — Patient Instructions (Signed)

## 2018-12-02 NOTE — Progress Notes (Signed)
Subjective:  Patient ID: Jeff Norton, male    DOB: [birth year]  Age: 53 y.o. MRN: [id number]  CC: Annual Exam, Cough, and Hyperlipidemia   HPI KRISTAIN HU presents for a CPX.  He complains that he has developed DOE over the last month.  He has had shortness of breath at rest for many years due to COPD but the DOE is new.  He complains of fatigue and prro endurance but denies chest pain, diaphoresis, dizziness, or lightheadedness.  He has a chronic unchanged cough that is minimally productive of a greenish phlegm.  Outpatient Medications Prior to Visit  Medication Sig Dispense Refill  . diclofenac (VOLTAREN) 75 MG EC tablet TAKE 1 TABLET BY MOUTH TWO  TIMES DAILY 180 tablet 1  . esomeprazole (NEXIUM) 20 MG capsule TK 1 C PO QD  3  . lamoTRIgine (LAMICTAL) 200 MG tablet TAKE 1 TABLET BY MOUTH TWO  TIMES DAILY 180 tablet 1  . LORazepam (ATIVAN) 1 MG tablet Take 1 tablet (1 mg total) by mouth 2 (two) times daily as needed for anxiety. 60 tablet 1  . modafinil (PROVIGIL) 200 MG tablet TAKE ONE TABLET BY MOUTH ONE TIME DAILY  30 tablet 4  . montelukast (SINGULAIR) 10 MG tablet TAKE 1 TABLET BY MOUTH AT  BEDTIME 90 tablet 1  . PROAIR RESPICLICK 993 (90 Base) MCG/ACT AEPB INHALE 2 PUFFS INTO THE LUNGS FOUR TIMES DAILY AS NEEDED 1 each 5  . risperiDONE (RISPERDAL) 3 MG tablet     . TRELEGY ELLIPTA 100-62.5-25 MCG/INH AEPB INHALE 1 PUFF INTO THE LUNGS DAILY 60 each 5  . Omega-3 Fatty Acids (FISH OIL) 1000 MG CAPS Take 2 capsules by mouth.    . rosuvastatin (CRESTOR) 10 MG tablet TAKE 1 TABLET BY MOUTH  DAILY 90 tablet 1  . cyclobenzaprine (FLEXERIL) 10 MG tablet Take 1 tablet (10 mg total) by mouth 3 (three) times daily as needed for muscle spasms. 65 tablet 2   No facility-administered medications prior to visit.     ROS Review of Systems  Constitutional: Negative for chills, diaphoresis, fatigue, fever and unexpected weight change.  HENT: Negative.  Negative for sore throat and  trouble swallowing.   Respiratory: Positive for cough and shortness of breath. Negative for choking and wheezing.   Cardiovascular: Negative for chest pain, palpitations and leg swelling.  Gastrointestinal: Negative for abdominal pain, constipation, diarrhea, nausea and vomiting.  Endocrine: Negative.   Genitourinary: Negative.  Negative for difficulty urinating, penile swelling, scrotal swelling and urgency.  Musculoskeletal: Negative.  Negative for arthralgias and myalgias.  Skin: Negative.  Negative for color change and pallor.  Neurological: Negative.  Negative for dizziness, syncope, weakness, light-headedness and headaches.  Hematological: Negative for adenopathy. Does not bruise/bleed easily.  Psychiatric/Behavioral: Negative.     Objective:  BP 128/74 (BP Location: Left Arm, Patient Position: Sitting, Cuff Size: Large)   Pulse 70   Temp 98.2 F (36.8 C) (Oral)   Resp 16   Ht 5\' 9"  (1.753 m)   Wt 226 lb (102.5 kg)   SpO2 96%   BMI 33.37 kg/m   BP Readings from Last 3 Encounters:  12/02/18 128/74  06/30/18 120/76  06/25/18 126/80    Wt Readings from Last 3 Encounters:  12/02/18 226 lb (102.5 kg)  06/30/18 231 lb (104.8 kg)  06/25/18 225 lb 1.3 oz (102.1 kg)    Physical Exam Vitals signs reviewed.  Constitutional:      Appearance: He is obese. He is  not ill-appearing or diaphoretic.  HENT:     Nose: Nose normal. No congestion.     Mouth/Throat:     Mouth: Mucous membranes are moist.  Eyes:     General: No scleral icterus.    Conjunctiva/sclera: Conjunctivae normal.  Neck:     Musculoskeletal: Normal range of motion. No neck rigidity or muscular tenderness.  Cardiovascular:     Rate and Rhythm: Normal rate and regular rhythm.     Heart sounds: No murmur. No gallop.      Comments: EKG --- Sinus  Rhythm  -Incomplete right bundle branch block.   ABNORMAL - the RBBB is a subtle new finding   Pulmonary:     Effort: Pulmonary effort is normal. No respiratory  distress.     Breath sounds: No stridor. No wheezing, rhonchi or rales.  Abdominal:     General: Abdomen is protuberant. Bowel sounds are normal. There is no distension.     Palpations: There is no hepatomegaly or splenomegaly.     Tenderness: There is no abdominal tenderness.     Hernia: There is no hernia in the left inguinal area or right inguinal area.  Genitourinary:    Pubic Area: No rash.      Penis: Normal. No discharge, swelling or lesions.      Scrotum/Testes: Normal.        Right: Mass, tenderness or swelling not present.        Left: Mass, tenderness or swelling not present.     Epididymis:     Right: Normal. No mass.     Left: Normal. No mass.     Prostate: Normal. Not enlarged, not tender and no nodules present.     Rectum: Guaiac result positive. Internal hemorrhoid present. No mass, tenderness, anal fissure or external hemorrhoid. Normal anal tone.  Musculoskeletal:     Right lower leg: No edema.     Left lower leg: No edema.  Lymphadenopathy:     Cervical: No cervical adenopathy.     Lower Body: No right inguinal adenopathy. No left inguinal adenopathy.  Skin:    General: Skin is warm and dry.     Coloration: Skin is not pale.  Neurological:     General: No focal deficit present.     Mental Status: He is oriented to person, place, and time. Mental status is at baseline.  Psychiatric:        Mood and Affect: Mood normal.        Behavior: Behavior normal.        Thought Content: Thought content normal.        Judgment: Judgment normal.     Lab Results  Component Value Date   WBC 9.9 12/02/2018   HGB 13.7 12/02/2018   HCT 40.2 12/02/2018   PLT 151.0 12/02/2018   GLUCOSE 92 12/02/2018   CHOL 111 12/02/2018   TRIG 212.0 (H) 12/02/2018   HDL 40.40 12/02/2018   LDLDIRECT 59.0 12/02/2018   LDLCALC 65 10/12/2017   ALT 29 12/02/2018   AST 23 12/02/2018   NA 139 12/02/2018   K 3.8 12/02/2018   CL 102 12/02/2018   CREATININE 0.77 12/02/2018   BUN 15  12/02/2018   CO2 27 12/02/2018   TSH 1.61 12/02/2018   PSA 0.60 12/02/2018   HGBA1C 6.0 12/02/2018   MICROALBUR 5.7 (H) 12/02/2018    Ct Cervical Spine Wo Contrast  Result Date: 09/22/2016 CLINICAL DATA:  Cervical radiculitis. Right cervical pain. Left upper extremity pain.  EXAM: CT CERVICAL SPINE WITHOUT CONTRAST TECHNIQUE: Multidetector CT imaging of the cervical spine was performed without intravenous contrast. Multiplanar CT image reconstructions were also generated. COMPARISON:  03/28/2011 FINDINGS: Alignment: Normal. Skull base and vertebrae: No acute fracture. No primary bone lesion or focal pathologic process. Soft tissues and spinal canal: No prevertebral fluid or swelling. No visible canal hematoma. Disc levels: Degenerative disc disease with disc height loss at C5-6 and T1-2. Anterior cervical fusion at C6-7 with plate and screw fixation, without hardware failure or complication. Solid osseous bridging across the C6-7 disc space. Left uncovertebral degenerative changes at C4-5 with left foraminal encroachment. At C5-6 there is a broad-based disc bulge with bilateral uncovertebral degenerative changes and bilateral foraminal narrowing. At C6-7 there is interbody fusion without foraminal stenosis. At C7-T1 there is right uncovertebral degenerative changes with moderate right foraminal stenosis. Upper chest: Lung apices are clear. Other: No fluid collection or hematoma. IMPRESSION: 1. Anterior cervical fusion at C6-7 with solid osseous fusion across the disc space. No foraminal stenosis. 2. Adjacent segment degenerative disc disease at C5-6 with bilateral uncovertebral degenerative changes and bilateral foraminal stenosis. 3. At C7-T1 there is right uncovertebral degenerative changes with moderate right foraminal stenosis. Electronically Signed   By: Kathreen Devoid   On: 09/22/2016 12:51    Assessment & Plan:   Nayshawn was seen today for annual exam, cough and hyperlipidemia.  Diagnoses and all  orders for this visit:  Type II diabetes mellitus with manifestations ([place])- His blood sugar is adequately well controlled. -     Comprehensive metabolic panel; Future -     CBC with Differential/Platelet; Future -     Hemoglobin A1c; Future -     Microalbumin / creatinine urine ratio; Future -     Ambulatory referral to Ophthalmology -     HM Diabetes Foot Exam  Nocturia associated with benign prostatic hyperplasia- He has no symptoms that need to be treated and his PSA is low which is reassuring that he does not have prostate cancer. -     PSA; Future -     Urinalysis, Routine w reflex microscopic; Future  Hyperlipidemia LDL goal <100- He has achieved his LDL goal and is doing well on the statin. -     Comprehensive metabolic panel; Future -     Lipid panel; Future -     TSH; Future -     rosuvastatin (CRESTOR) 10 MG tablet; Take 1 tablet (10 mg total) by mouth daily.  Routine general medical examination at a health care facility- Exam completed.  DOE (dyspnea on exertion)- He has developed DOE and a new right bundle blanch block.  He is high risk for CAD so I have asked him to undergo a myocardial perfusion imaging to screen for ischemia. -     EKG 12-Lead -     Troponin I -; Future -     Brain natriuretic peptide; Future -     MYOCARDIAL PERFUSION IMAGING; Future  Smokers' cough ([place]) -     Ambulatory Referral for Lung Cancer Scre  Abnormal electrocardiogram (ECG) (EKG) -     MYOCARDIAL PERFUSION IMAGING; Future  Abnormal electrocardiogram -     MYOCARDIAL PERFUSION IMAGING; Future  Pure hypertriglyceridemia- I have asked him to continue taking the fish oil supplement and to improve his lifestyle modifications. -     omega-3 acid ethyl esters (LOVAZA) 1 g capsule; Take 2 capsules (2 g total) by mouth 2 (two) times daily.  Type 2  diabetes mellitus with complication, without long-term current use of insulin ([place])   I have discontinued Jameel M. Bost's Fish Oil and  cyclobenzaprine. I have also changed his rosuvastatin. Additionally, I am having him start on omega-3 acid ethyl esters. Lastly, I am having him maintain his ProAir RespiClick, esomeprazole, Trelegy Ellipta, diclofenac, lamoTRIgine, montelukast, modafinil, risperiDONE, and LORazepam.  Meds ordered this encounter  Medications  . omega-3 acid ethyl esters (LOVAZA) 1 g capsule    Sig: Take 2 capsules (2 g total) by mouth 2 (two) times daily.    Dispense:  360 capsule    Refill:  1  . rosuvastatin (CRESTOR) 10 MG tablet    Sig: Take 1 tablet (10 mg total) by mouth daily.    Dispense:  90 tablet    Refill:  1     Follow-up: Return in about 3 months (around 03/04/2019).  Scarlette Calico, MD

## 2018-12-03 ENCOUNTER — Encounter: Payer: Self-pay | Admitting: Internal Medicine

## 2018-12-08 ENCOUNTER — Encounter: Payer: Self-pay | Admitting: Internal Medicine

## 2018-12-08 NOTE — Telephone Encounter (Signed)
I will reply to pt but can you put [facility] [address] or [facility] [address] on the order for his stress test, please.   Thank you

## 2018-12-09 ENCOUNTER — Telehealth (HOSPITAL_COMMUNITY): Payer: Self-pay

## 2018-12-09 NOTE — Addendum Note (Signed)
Addended by: Karle Barr on: 12/09/2018 10:17 AM   Modules accepted: Orders

## 2018-12-09 NOTE — Telephone Encounter (Signed)
Detailed instructions left on the patient's cell phone. Asked to call back with questions. S.Keagan Brislin EMTP

## 2018-12-09 NOTE — Telephone Encounter (Signed)
Yes.  Thank you.

## 2018-12-13 ENCOUNTER — Encounter: Payer: Self-pay | Admitting: Internal Medicine

## 2018-12-13 ENCOUNTER — Other Ambulatory Visit: Payer: Self-pay

## 2018-12-13 ENCOUNTER — Ambulatory Visit (HOSPITAL_COMMUNITY): Payer: Medicare Other | Attending: Cardiology

## 2018-12-13 DIAGNOSIS — R0609 Other forms of dyspnea: Secondary | ICD-10-CM

## 2018-12-13 DIAGNOSIS — R0600 Dyspnea, unspecified: Secondary | ICD-10-CM

## 2018-12-13 DIAGNOSIS — R9431 Abnormal electrocardiogram [ECG] [EKG]: Secondary | ICD-10-CM | POA: Diagnosis not present

## 2018-12-13 LAB — MYOCARDIAL PERFUSION IMAGING
LV dias vol: 118 mL (ref 62–150)
LV sys vol: 42 mL
Peak HR: 88 {beats}/min
Rest HR: 59 {beats}/min
SDS: 1
SRS: 0
SSS: 1
TID: 1.07

## 2018-12-13 MED ORDER — REGADENOSON 0.4 MG/5ML IV SOLN
0.4000 mg | Freq: Once | INTRAVENOUS | Status: AC
  Administered 2018-12-13: 0.4 mg via INTRAVENOUS

## 2018-12-13 MED ORDER — TECHNETIUM TC 99M TETROFOSMIN IV KIT
10.9000 | PACK | Freq: Once | INTRAVENOUS | Status: AC | PRN
  Administered 2018-12-13: 10.9 via INTRAVENOUS
  Filled 2018-12-13: qty 11

## 2018-12-13 MED ORDER — TECHNETIUM TC 99M TETROFOSMIN IV KIT
31.7000 | PACK | Freq: Once | INTRAVENOUS | Status: AC | PRN
  Administered 2018-12-13: 31.7 via INTRAVENOUS
  Filled 2018-12-13: qty 32

## 2018-12-16 ENCOUNTER — Other Ambulatory Visit: Payer: Self-pay | Admitting: Internal Medicine

## 2018-12-16 DIAGNOSIS — E785 Hyperlipidemia, unspecified: Secondary | ICD-10-CM

## 2018-12-20 ENCOUNTER — Encounter: Payer: Self-pay | Admitting: Internal Medicine

## 2018-12-21 ENCOUNTER — Other Ambulatory Visit: Payer: Self-pay | Admitting: Internal Medicine

## 2018-12-21 DIAGNOSIS — F411 Generalized anxiety disorder: Secondary | ICD-10-CM

## 2018-12-21 MED ORDER — LORAZEPAM 1 MG PO TABS: 1.0000 mg | ORAL_TABLET | Freq: Two times a day (BID) | ORAL | 2 refills | Status: DC | PRN

## 2019-01-07 DIAGNOSIS — H524 Presbyopia: Secondary | ICD-10-CM | POA: Diagnosis not present

## 2019-01-07 DIAGNOSIS — H1789 Other corneal scars and opacities: Secondary | ICD-10-CM | POA: Diagnosis not present

## 2019-01-07 LAB — HM DIABETES EYE EXAM

## 2019-02-09 ENCOUNTER — Other Ambulatory Visit: Payer: Self-pay | Admitting: Internal Medicine

## 2019-02-09 DIAGNOSIS — M19071 Primary osteoarthritis, right ankle and foot: Secondary | ICD-10-CM

## 2019-02-09 DIAGNOSIS — J301 Allergic rhinitis due to pollen: Secondary | ICD-10-CM

## 2019-02-09 DIAGNOSIS — M4802 Spinal stenosis, cervical region: Secondary | ICD-10-CM

## 2019-02-21 ENCOUNTER — Other Ambulatory Visit: Payer: Self-pay | Admitting: Internal Medicine

## 2019-02-21 DIAGNOSIS — M4802 Spinal stenosis, cervical region: Secondary | ICD-10-CM

## 2019-02-22 ENCOUNTER — Encounter: Payer: Self-pay | Admitting: Internal Medicine

## 2019-02-22 DIAGNOSIS — Z20822 Contact with and (suspected) exposure to covid-19: Secondary | ICD-10-CM

## 2019-02-22 DIAGNOSIS — Z20828 Contact with and (suspected) exposure to other viral communicable diseases: Secondary | ICD-10-CM

## 2019-02-23 ENCOUNTER — Other Ambulatory Visit: Payer: Self-pay

## 2019-02-23 DIAGNOSIS — R6889 Other general symptoms and signs: Secondary | ICD-10-CM | POA: Diagnosis not present

## 2019-02-23 DIAGNOSIS — Z20822 Contact with and (suspected) exposure to covid-19: Secondary | ICD-10-CM

## 2019-02-24 LAB — NOVEL CORONAVIRUS, NAA: SARS-CoV-2, NAA: NOT DETECTED

## 2019-02-25 ENCOUNTER — Encounter: Payer: Self-pay | Admitting: Internal Medicine

## 2019-02-25 ENCOUNTER — Telehealth: Payer: Self-pay | Admitting: Internal Medicine

## 2019-02-25 NOTE — Telephone Encounter (Signed)
Negative COVID results given. Patient results "NOT Detected." Caller expressed understanding. ° °

## 2019-02-28 ENCOUNTER — Ambulatory Visit (INDEPENDENT_AMBULATORY_CARE_PROVIDER_SITE_OTHER): Payer: Medicare Other

## 2019-02-28 ENCOUNTER — Other Ambulatory Visit: Payer: Self-pay

## 2019-02-28 DIAGNOSIS — Z23 Encounter for immunization: Secondary | ICD-10-CM

## 2019-03-02 DIAGNOSIS — G4733 Obstructive sleep apnea (adult) (pediatric): Secondary | ICD-10-CM | POA: Diagnosis not present

## 2019-03-03 ENCOUNTER — Encounter: Payer: Self-pay | Admitting: Internal Medicine

## 2019-04-20 ENCOUNTER — Ambulatory Visit: Payer: Medicare Other

## 2019-05-04 ENCOUNTER — Other Ambulatory Visit: Payer: Self-pay | Admitting: Internal Medicine

## 2019-05-04 DIAGNOSIS — F411 Generalized anxiety disorder: Secondary | ICD-10-CM

## 2019-05-04 MED ORDER — LORAZEPAM 1 MG PO TABS: 1.0000 mg | ORAL_TABLET | Freq: Two times a day (BID) | ORAL | 2 refills | Status: DC | PRN

## 2019-05-05 ENCOUNTER — Encounter: Payer: Self-pay | Admitting: Internal Medicine

## 2019-05-05 ENCOUNTER — Other Ambulatory Visit: Payer: Self-pay | Admitting: Internal Medicine

## 2019-05-05 DIAGNOSIS — J449 Chronic obstructive pulmonary disease, unspecified: Secondary | ICD-10-CM

## 2019-05-05 MED ORDER — TRELEGY ELLIPTA 100-62.5-25 MCG/INH IN AEPB: 1.0000 | INHALATION_SPRAY | Freq: Every day | RESPIRATORY_TRACT | 1 refills | Status: DC

## 2019-05-10 DIAGNOSIS — G4733 Obstructive sleep apnea (adult) (pediatric): Secondary | ICD-10-CM | POA: Diagnosis not present

## 2019-05-21 ENCOUNTER — Other Ambulatory Visit: Payer: Self-pay | Admitting: Internal Medicine

## 2019-05-21 DIAGNOSIS — E785 Hyperlipidemia, unspecified: Secondary | ICD-10-CM

## 2019-05-30 ENCOUNTER — Other Ambulatory Visit: Payer: Self-pay | Admitting: Internal Medicine

## 2019-06-02 ENCOUNTER — Encounter: Payer: Self-pay | Admitting: Internal Medicine

## 2019-06-24 ENCOUNTER — Encounter: Payer: Self-pay | Admitting: Internal Medicine

## 2019-07-08 DIAGNOSIS — D485 Neoplasm of uncertain behavior of skin: Secondary | ICD-10-CM | POA: Diagnosis not present

## 2019-07-08 DIAGNOSIS — L570 Actinic keratosis: Secondary | ICD-10-CM | POA: Diagnosis not present

## 2019-07-21 ENCOUNTER — Other Ambulatory Visit: Payer: Self-pay | Admitting: Internal Medicine

## 2019-07-21 DIAGNOSIS — M19071 Primary osteoarthritis, right ankle and foot: Secondary | ICD-10-CM

## 2019-07-21 DIAGNOSIS — M4802 Spinal stenosis, cervical region: Secondary | ICD-10-CM

## 2019-08-06 ENCOUNTER — Other Ambulatory Visit: Payer: Self-pay | Admitting: Internal Medicine

## 2019-08-06 DIAGNOSIS — M19071 Primary osteoarthritis, right ankle and foot: Secondary | ICD-10-CM

## 2019-08-06 DIAGNOSIS — M4802 Spinal stenosis, cervical region: Secondary | ICD-10-CM

## 2019-08-09 DIAGNOSIS — G4733 Obstructive sleep apnea (adult) (pediatric): Secondary | ICD-10-CM | POA: Diagnosis not present

## 2019-09-14 ENCOUNTER — Other Ambulatory Visit: Payer: Self-pay | Admitting: *Deleted

## 2019-09-14 DIAGNOSIS — F17210 Nicotine dependence, cigarettes, uncomplicated: Secondary | ICD-10-CM

## 2019-09-14 DIAGNOSIS — Z87891 Personal history of nicotine dependence: Secondary | ICD-10-CM

## 2019-09-27 ENCOUNTER — Other Ambulatory Visit: Payer: Self-pay | Admitting: Internal Medicine

## 2019-09-27 DIAGNOSIS — M19071 Primary osteoarthritis, right ankle and foot: Secondary | ICD-10-CM

## 2019-09-27 DIAGNOSIS — M4802 Spinal stenosis, cervical region: Secondary | ICD-10-CM

## 2019-09-30 ENCOUNTER — Other Ambulatory Visit: Payer: Self-pay | Admitting: Internal Medicine

## 2019-09-30 DIAGNOSIS — J449 Chronic obstructive pulmonary disease, unspecified: Secondary | ICD-10-CM

## 2019-10-03 DIAGNOSIS — D225 Melanocytic nevi of trunk: Secondary | ICD-10-CM | POA: Diagnosis not present

## 2019-10-03 DIAGNOSIS — L570 Actinic keratosis: Secondary | ICD-10-CM | POA: Diagnosis not present

## 2019-10-03 DIAGNOSIS — Z85828 Personal history of other malignant neoplasm of skin: Secondary | ICD-10-CM | POA: Diagnosis not present

## 2019-10-03 DIAGNOSIS — L817 Pigmented purpuric dermatosis: Secondary | ICD-10-CM | POA: Diagnosis not present

## 2019-10-03 DIAGNOSIS — L821 Other seborrheic keratosis: Secondary | ICD-10-CM | POA: Diagnosis not present

## 2019-10-03 DIAGNOSIS — D2261 Melanocytic nevi of right upper limb, including shoulder: Secondary | ICD-10-CM | POA: Diagnosis not present

## 2019-10-13 ENCOUNTER — Other Ambulatory Visit: Payer: Self-pay | Admitting: Internal Medicine

## 2019-10-13 DIAGNOSIS — M4802 Spinal stenosis, cervical region: Secondary | ICD-10-CM

## 2019-10-13 DIAGNOSIS — M19071 Primary osteoarthritis, right ankle and foot: Secondary | ICD-10-CM

## 2019-10-17 ENCOUNTER — Encounter: Payer: Self-pay | Admitting: Acute Care

## 2019-10-17 ENCOUNTER — Ambulatory Visit (INDEPENDENT_AMBULATORY_CARE_PROVIDER_SITE_OTHER): Payer: Medicare Other | Admitting: Acute Care

## 2019-10-17 ENCOUNTER — Ambulatory Visit (INDEPENDENT_AMBULATORY_CARE_PROVIDER_SITE_OTHER)
Admission: RE | Admit: 2019-10-17 | Discharge: 2019-10-17 | Disposition: A | Discharge status: Home / Self Care | Payer: Medicare Other | Source: Ambulatory Visit | Attending: Acute Care | Admitting: Acute Care

## 2019-10-17 ENCOUNTER — Other Ambulatory Visit: Payer: Self-pay

## 2019-10-17 DIAGNOSIS — Z87891 Personal history of nicotine dependence: Secondary | ICD-10-CM | POA: Diagnosis not present

## 2019-10-17 DIAGNOSIS — Z716 Tobacco abuse counseling: Secondary | ICD-10-CM

## 2019-10-17 DIAGNOSIS — F17210 Nicotine dependence, cigarettes, uncomplicated: Secondary | ICD-10-CM | POA: Diagnosis not present

## 2019-10-17 DIAGNOSIS — Z122 Encounter for screening for malignant neoplasm of respiratory organs: Secondary | ICD-10-CM

## 2019-10-17 NOTE — Patient Instructions (Signed)
Thank you for participating in the [facility]. It was our pleasure to meet you today. We will call you with the results of your scan within the next few days. Your scan will be assigned a Lung RADS category score by the physicians reading the scans.  This Lung RADS score determines follow up scanning.  See below for description of categories, and follow up screening recommendations. We will be in touch to schedule your follow up screening annually or based on recommendations of our providers. We will fax a copy of your scan results to your Primary Care Physician, or the physician who referred you to the program, to ensure they have the results. Please call the office if you have any questions or concerns regarding your scanning experience or results.  Our office number is [phone]. Please speak with Denise Phelps, RN. She is our Lung Cancer Screening RN. If she is unavailable when you call, please have the office staff send her a message. She will return your call at her earliest convenience. Remember, if your scan is normal, we will scan you annually as long as you continue to meet the criteria for the program. (Age 55-77, Current smoker or smoker who has quit within the last 15 years). If you are a smoker, remember, quitting is the single most powerful action that you can take to decrease your risk of lung cancer and other pulmonary, breathing related problems. We know quitting is hard, and we are here to help.  Please let us know if there is anything we can do to help you meet your goal of quitting. If you are a former smoker, congratulations. We are proud of you! Remain smoke free! Remember you can refer friends or family members through the number above.  We will screen them to make sure they meet criteria for the program. Thank you for helping us take better care of you by participating in Lung Screening.  Lung RADS Categories:  Lung RADS 1: no nodules  or definitely non-concerning nodules.  Recommendation is for a repeat annual scan in 12 months.  Lung RADS 2:  nodules that are non-concerning in appearance and behavior with a very low likelihood of becoming an active cancer. Recommendation is for a repeat annual scan in 12 months.  Lung RADS 3: nodules that are probably non-concerning , includes nodules with a low likelihood of becoming an active cancer.  Recommendation is for a 6-month repeat screening scan. Often noted after an upper respiratory illness. We will be in touch to make sure you have no questions, and to schedule your 6-month scan.  Lung RADS 4 A: nodules with concerning findings, recommendation is most often for a follow up scan in 3 months or additional testing based on our provider's assessment of the scan. We will be in touch to make sure you have no questions and to schedule the recommended 3 month follow up scan.  Lung RADS 4 B:  indicates findings that are concerning. We will be in touch with you to schedule additional diagnostic testing based on our provider's  assessment of the scan.   

## 2019-10-17 NOTE — Progress Notes (Signed)
Shared Decision Making Visit Lung Cancer Screening Program [phone])   Eligibility:  Age 54 y.o.  Pack Years Smoking History Calculation 42 pack year smoking history (# packs/per year x # years smoked)  Recent History of coughing up blood  no  Unexplained weight loss? no ( >Than 15 pounds within the last 6 months )  Prior History Lung / other cancer no (Diagnosis within the last 5 years already requiring surveillance chest CT Scans).  Smoking Status Current Smoker  Former Smokers: Years since quit: NA  Quit Date: NA  Visit Components:  Discussion included one or more decision making aids. yes  Discussion included risk/benefits of screening. yes  Discussion included potential follow up diagnostic testing for abnormal scans. yes  Discussion included meaning and risk of over diagnosis. yes  Discussion included meaning and risk of False Positives. yes  Discussion included meaning of total radiation exposure. yes  Counseling Included:  Importance of adherence to annual lung cancer LDCT screening. yes  Impact of comorbidities on ability to participate in the program. yes  Ability and willingness to under diagnostic treatment. yes  Smoking Cessation Counseling:  Current Smokers:   Discussed importance of smoking cessation. yes  Information about tobacco cessation classes and interventions provided to patient. yes  Patient provided with "ticket" for LDCT Scan. yes  Symptomatic Patient. no  Counseling NA  Diagnosis Code: Tobacco Use Z72.0  Asymptomatic Patient yes  Counseling (Intermediate counseling: > three minutes counseling) [id number]  Former Smokers:   Discussed the importance of maintaining cigarette abstinence. yes  Diagnosis Code: Personal History of Nicotine Dependence. [id number]  Information about tobacco cessation classes and interventions provided to patient. Yes  Patient provided with "ticket" for LDCT Scan. yes  Written Order for Lung Cancer  Screening with LDCT placed in Epic. Yes (CT Chest Lung Cancer Screening Low Dose W/O CM) [id number] Z12.2-Screening of respiratory organs Z87.891-Personal history of nicotine dependence   I have spent 25 minutes of face to face time with Mr. Reitenbach discussing the risks and benefits of lung cancer screening. We viewed a power point together that explained in detail the above noted topics. We paused at intervals to allow for questions to be asked and answered to ensure understanding.We discussed that the single most powerful action that he can take to decrease his risk of developing lung cancer is to quit smoking. We discussed whether or not he is ready to commit to setting a quit date. We discussed options for tools to aid in quitting smoking including nicotine replacement therapy, non-nicotine medications, support groups, Quit Smart classes, and behavior modification. We discussed that often times setting smaller, more achievable goals, such as eliminating 1 cigarette a day for a week and then 2 cigarettes a day for a week can be helpful in slowly decreasing the number of cigarettes smoked. This allows for a sense of accomplishment as well as providing a clinical benefit. I gave him the " Be Stronger Than Your Excuses" card with contact information for community resources, classes, free nicotine replacement therapy, and access to mobile apps, text messaging, and on-line smoking cessation help. I have also given him my card and contact information in the event he needs to contact me. We discussed the time and location of the scan, and that either Doroteo Glassman RN or I will call with the results within 24-48 hours of receiving them. I have offered him  a copy of the power point we viewed  as a resource in the event they need  reinforcement of the concepts we discussed today in the office. The patient verbalized understanding of all of  the above and had no further questions upon leaving the office. They have my  contact information in the event they have any further questions.  I spent 3 minutes counseling on smoking cessation and the health risks of continued tobacco abuse.  I explained to the patient that there has been a high incidence of coronary artery disease noted on these exams. I explained that this is a non-gated exam therefore degree or severity cannot be determined. This patient is currently on statin therapy. I have asked the patient to follow-up with their PCP regarding any incidental finding of coronary artery disease and management with diet or medication as their PCP  feels is clinically indicated. The patient verbalized understanding of the above and had no further questions upon completion of the visit.  Pt. Is contemplating quitting. We discussed nicotine replacement and behavior modification therapy. I have told him we can set him up for an appointment with pharmacy. He wants to try reduce to quit first. I have told him to call the office for help if reduce to quit is not successful.   Magdalen Spatz, NP 10/17/2019

## 2019-10-18 ENCOUNTER — Other Ambulatory Visit: Payer: Self-pay | Admitting: *Deleted

## 2019-10-18 DIAGNOSIS — F17210 Nicotine dependence, cigarettes, uncomplicated: Secondary | ICD-10-CM

## 2019-10-18 NOTE — Progress Notes (Signed)
Please call patient and let them  know their  low dose Ct was read as a Lung RADS 2: nodules that are benign in appearance and behavior with a very low likelihood of becoming a clinically active cancer due to size or lack of growth. Recommendation per radiology is for a repeat LDCT in 12 months. .Please let them  know we will order and schedule their  annual screening scan for 10/2020. Please let them  know there was notation of CAD on their  scan.  Please remind the patient  that this is a non-gated exam therefore degree or severity of disease  cannot be determined. Please have them  follow up with their PCP regarding potential risk factor modification, dietary therapy or pharmacologic therapy if clinically indicated. Pt.  is not  currently on statin therapy. Please place order for annual  screening scan for  10/2020 and fax results to PCP. Thanks so much.

## 2019-10-19 ENCOUNTER — Encounter: Payer: Self-pay | Admitting: Internal Medicine

## 2019-10-19 ENCOUNTER — Other Ambulatory Visit: Payer: Self-pay

## 2019-10-19 ENCOUNTER — Ambulatory Visit (INDEPENDENT_AMBULATORY_CARE_PROVIDER_SITE_OTHER): Payer: Medicare Other | Admitting: Internal Medicine

## 2019-10-19 VITALS — BP 110/64 | HR 65 | Temp 98.5°F | Ht 69.0 in | Wt 219.0 lb

## 2019-10-19 DIAGNOSIS — E118 Type 2 diabetes mellitus with unspecified complications: Secondary | ICD-10-CM | POA: Diagnosis not present

## 2019-10-19 DIAGNOSIS — M4802 Spinal stenosis, cervical region: Secondary | ICD-10-CM

## 2019-10-19 DIAGNOSIS — N401 Enlarged prostate with lower urinary tract symptoms: Secondary | ICD-10-CM | POA: Diagnosis not present

## 2019-10-19 DIAGNOSIS — R0681 Apnea, not elsewhere classified: Secondary | ICD-10-CM

## 2019-10-19 DIAGNOSIS — E785 Hyperlipidemia, unspecified: Secondary | ICD-10-CM | POA: Diagnosis not present

## 2019-10-19 DIAGNOSIS — Z0000 Encounter for general adult medical examination without abnormal findings: Secondary | ICD-10-CM

## 2019-10-19 DIAGNOSIS — K9089 Other intestinal malabsorption: Secondary | ICD-10-CM

## 2019-10-19 DIAGNOSIS — M19071 Primary osteoarthritis, right ankle and foot: Secondary | ICD-10-CM

## 2019-10-19 DIAGNOSIS — J449 Chronic obstructive pulmonary disease, unspecified: Secondary | ICD-10-CM | POA: Diagnosis not present

## 2019-10-19 DIAGNOSIS — K219 Gastro-esophageal reflux disease without esophagitis: Secondary | ICD-10-CM | POA: Insufficient documentation

## 2019-10-19 DIAGNOSIS — J4489 Other specified chronic obstructive pulmonary disease: Secondary | ICD-10-CM

## 2019-10-19 DIAGNOSIS — R351 Nocturia: Secondary | ICD-10-CM

## 2019-10-19 NOTE — Patient Instructions (Signed)
Preventive Care 41-55 Years Old, Male Preventive care refers to lifestyle choices and visits with your health care provider that can promote health and wellness. This includes:  A yearly physical exam. This is also called an annual well check.  Regular dental and eye exams.  Immunizations.  Screening for certain conditions.  Healthy lifestyle choices, such as eating a healthy diet, getting regular exercise, not using drugs or products that contain nicotine and tobacco, and limiting alcohol use. What can I expect for my preventive care visit? Physical exam Your health care provider will check:  Height and weight. These may be used to calculate body mass index (BMI), which is a measurement that tells if you are at a healthy weight.  Heart rate and blood pressure.  Your skin for abnormal spots. Counseling Your health care provider may ask you questions about:  Alcohol, tobacco, and drug use.  Emotional well-being.  Home and relationship well-being.  Sexual activity.  Eating habits.  Work and work Statistician. What immunizations do I need?  Influenza (flu) vaccine  This is recommended every year. Tetanus, diphtheria, and pertussis (Tdap) vaccine  You may need a Td booster every 10 years. Varicella (chickenpox) vaccine  You may need this vaccine if you have not already been vaccinated. Zoster (shingles) vaccine  You may need this after age 64. Measles, mumps, and rubella (MMR) vaccine  You may need at least one dose of MMR if you were born in 1957 or later. You may also need a second dose. Pneumococcal conjugate (PCV13) vaccine  You may need this if you have certain conditions and were not previously vaccinated. Pneumococcal polysaccharide (PPSV23) vaccine  You may need one or two doses if you smoke cigarettes or if you have certain conditions. Meningococcal conjugate (MenACWY) vaccine  You may need this if you have certain conditions. Hepatitis A  vaccine  You may need this if you have certain conditions or if you travel or work in places where you may be exposed to hepatitis A. Hepatitis B vaccine  You may need this if you have certain conditions or if you travel or work in places where you may be exposed to hepatitis B. Haemophilus influenzae type b (Hib) vaccine  You may need this if you have certain risk factors. Human papillomavirus (HPV) vaccine  If recommended by your health care provider, you may need three doses over 6 months. You may receive vaccines as individual doses or as more than one vaccine together in one shot (combination vaccines). Talk with your health care provider about the risks and benefits of combination vaccines. What tests do I need? Blood tests  Lipid and cholesterol levels. These may be checked every 5 years, or more frequently if you are over 60 years old.  Hepatitis C test.  Hepatitis B test. Screening  Lung cancer screening. You may have this screening every year starting at age 43 if you have a 30-pack-year history of smoking and currently smoke or have quit within the past 15 years.  Prostate cancer screening. Recommendations will vary depending on your family history and other risks.  Colorectal cancer screening. All adults should have this screening starting at age 72 and continuing until age 2. Your health care provider may recommend screening at age 14 if you are at increased risk. You will have tests every 1-10 years, depending on your results and the type of screening test.  Diabetes screening. This is done by checking your blood sugar (glucose) after you have not eaten  for a while (fasting). You may have this done every 1-3 years.  Sexually transmitted disease (STD) testing. Follow these instructions at home: Eating and drinking  Eat a diet that includes fresh fruits and vegetables, whole grains, lean protein, and low-fat dairy products.  Take vitamin and mineral supplements as  recommended by your health care provider.  Do not drink alcohol if your health care provider tells you not to drink.  If you drink alcohol: ? Limit how much you have to 0-2 drinks a day. ? Be aware of how much alcohol is in your drink. In the [place] one drink equals one 12 oz bottle of beer (355 mL), one 5 oz glass of wine (148 mL), or one 1 oz glass of hard liquor (44 mL). Lifestyle  Take daily care of your teeth and gums.  Stay active. Exercise for at least 30 minutes on 5 or more days each week.  Do not use any products that contain nicotine or tobacco, such as cigarettes, e-cigarettes, and chewing tobacco. If you need help quitting, ask your health care provider.  If you are sexually active, practice safe sex. Use a condom or other form of protection to prevent STIs (sexually transmitted infections).  Talk with your health care provider about taking a low-dose aspirin every day starting at age 53. What's next?  Go to your health care provider once a year for a well check visit.  Ask your health care provider how often you should have your eyes and teeth checked.  Stay up to date on all vaccines. This information is not intended to replace advice given to you by your health care provider. Make sure you discuss any questions you have with your health care provider. Document Revised: 05/27/2018 Document Reviewed: 05/27/2018 Elsevier Patient Education  2020 [employer].

## 2019-10-19 NOTE — Progress Notes (Signed)
Subjective:  Patient ID: Jeff Norton, male    DOB: 08-Nov-1965  Age: 54 y.o. MRN: [id number]  CC: Diabetes, Annual Exam, and Osteoarthritis  This visit occurred during the SARS-CoV-2 public health emergency.  Safety protocols were in place, including screening questions prior to the visit, additional usage of staff PPE, and extensive cleaning of exam room while observing appropriate contact time as indicated for disinfecting solutions.    HPI Jeff Norton presents for a CPX.  He tells me that all of his chronic diseases have been relatively well controlled over the last year.  He continues to complain of neck pain and ankle pain and wants a refill of his anti-inflammatory.  The neck pain does not radiate towards his extremities and he denies paresthesias.  He tells me his lung disease is well controlled with the triple inhaler.  He said no recent episodes of coughing, wheezing, chest pain, shortness of breath, or hemoptysis.  Outpatient Medications Prior to Visit  Medication Sig Dispense Refill  . esomeprazole (NEXIUM) 20 MG capsule TK 1 C PO QD  3  . lamoTRIgine (LAMICTAL) 200 MG tablet TAKE 1 TABLET BY MOUTH TWO  TIMES DAILY 180 tablet 1  . LORazepam (ATIVAN) 1 MG tablet Take 1 tablet (1 mg total) by mouth 2 (two) times daily as needed for anxiety. 60 tablet 2  . modafinil (PROVIGIL) 200 MG tablet TAKE ONE TABLET BY MOUTH ONE TIME DAILY  30 tablet 4  . montelukast (SINGULAIR) 10 MG tablet TAKE 1 TABLET BY MOUTH AT  BEDTIME 90 tablet 1  . omega-3 acid ethyl esters (LOVAZA) 1 g capsule Take 2 capsules (2 g total) by mouth 2 (two) times daily. 360 capsule 1  . PROAIR RESPICLICK [id number] (90 Base) MCG/ACT AEPB INHALE 2 PUFFS INTO THE LUNGS FOUR TIMES DAILY AS NEEDED 1 each 5  . risperiDONE (RISPERDAL) 3 MG tablet     . rosuvastatin (CRESTOR) 10 MG tablet TAKE 1 TABLET BY MOUTH  DAILY 90 tablet 1  . TRELEGY ELLIPTA 100-62.5-25 MCG/INH AEPB INHALE 1 PUFF INTO THE LUNGS DAILY 120 each 0  .  diclofenac (VOLTAREN) 75 MG EC tablet TAKE 1 TABLET BY MOUTH  TWICE DAILY 180 tablet 0   No facility-administered medications prior to visit.    ROS Review of Systems  Constitutional: Negative.  Negative for diaphoresis and unexpected weight change.  HENT: Negative.  Negative for trouble swallowing.   Respiratory: Negative for cough, chest tightness, shortness of breath and wheezing.   Cardiovascular: Negative for chest pain, palpitations and leg swelling.  Gastrointestinal: Negative for abdominal pain, constipation, diarrhea, nausea and vomiting.  Endocrine: Negative.   Genitourinary: Negative.  Negative for difficulty urinating, hematuria, scrotal swelling, testicular pain and urgency.  Musculoskeletal: Positive for arthralgias and neck pain. Negative for back pain and myalgias.  Skin: Negative.  Negative for color change, pallor and rash.  Allergic/Immunologic: Negative.   Neurological: Negative.  Negative for dizziness, weakness, light-headedness and numbness.  Hematological: Negative for adenopathy. Does not bruise/bleed easily.  Psychiatric/Behavioral: Negative.     Objective:  BP 110/64 (BP Location: Left Arm, Patient Position: Sitting, Cuff Size: Large)   Pulse 65   Temp 98.5 F (36.9 C) (Oral)   Ht 5\' 9"  (1.753 m)   Wt 219 lb (99.3 kg)   SpO2 95%   BMI 32.34 kg/m   BP Readings from Last 3 Encounters:  10/19/19 110/64  12/02/18 128/74  06/30/18 120/76    Wt Readings from Last 3  Encounters:  10/19/19 219 lb (99.3 kg)  12/13/18 226 lb (102.5 kg)  12/02/18 226 lb (102.5 kg)    Physical Exam Vitals reviewed.  Constitutional:      General: He is not in acute distress.    Appearance: Normal appearance. He is not ill-appearing, toxic-appearing or diaphoretic.  HENT:     Nose: Nose normal.     Mouth/Throat:     Mouth: Mucous membranes are moist.  Eyes:     General: No scleral icterus.    Conjunctiva/sclera: Conjunctivae normal.  Cardiovascular:     Rate and  Rhythm: Normal rate and regular rhythm.     Heart sounds: No murmur.  Pulmonary:     Effort: Pulmonary effort is normal.     Breath sounds: No stridor. No wheezing, rhonchi or rales.  Abdominal:     General: Abdomen is flat. Bowel sounds are normal. There is no distension.     Palpations: Abdomen is soft. There is no fluid wave, hepatomegaly, splenomegaly or mass.     Tenderness: There is no abdominal tenderness.  Musculoskeletal:        General: Normal range of motion.     Cervical back: Neck supple.     Right lower leg: No edema.     Left lower leg: No edema.  Lymphadenopathy:     Cervical: No cervical adenopathy.  Skin:    General: Skin is warm and dry.     Coloration: Skin is not pale.     Findings: No rash.  Neurological:     General: No focal deficit present.     Mental Status: He is alert and oriented to person, place, and time. Mental status is at baseline.  Psychiatric:        Mood and Affect: Mood normal.        Behavior: Behavior normal.        Thought Content: Thought content normal.        Judgment: Judgment normal.     Lab Results  Component Value Date   WBC 8.7 10/19/2019   HGB 14.1 10/19/2019   HCT 40.3 10/19/2019   PLT 154.0 10/19/2019   GLUCOSE 127 (H) 10/19/2019   CHOL 112 10/19/2019   TRIG 186.0 (H) 10/19/2019   HDL 38.30 (L) 10/19/2019   LDLDIRECT 59.0 12/02/2018   LDLCALC 37 10/19/2019   ALT 30 10/19/2019   AST 29 10/19/2019   NA 138 10/19/2019   K 4.1 10/19/2019   CL 101 10/19/2019   CREATININE 0.87 10/19/2019   BUN 22 10/19/2019   CO2 31 10/19/2019   TSH 1.61 12/02/2018   PSA 0.51 10/19/2019   HGBA1C 5.8 10/19/2019   MICROALBUR 5.7 (H) 12/02/2018    CT CHEST LUNG CA SCREEN LOW DOSE W/O CM  Result Date: 10/18/2019 CLINICAL DATA:  54 year old male with 42 pack-year history of smoking. Lung cancer screening. EXAM: CT CHEST WITHOUT CONTRAST LOW-DOSE FOR LUNG CANCER SCREENING TECHNIQUE: Multidetector CT imaging of the chest was performed  following the standard protocol without IV contrast. COMPARISON:  None. FINDINGS: Cardiovascular: The heart size is normal. No substantial pericardial effusion. Coronary artery calcification is evident. Atherosclerotic calcification is noted in the wall of the thoracic aorta. Mediastinum/Nodes: No mediastinal lymphadenopathy. No evidence for gross hilar lymphadenopathy although assessment is limited by the lack of intravenous contrast on today's study. The esophagus has normal imaging features. There is no axillary lymphadenopathy. Lungs/Pleura: Centrilobular emphsyema noted. Multiple scattered bilateral calcified and noncalcified pulmonary nodules are identified measuring up  to maximum volume derived equivalent diameter of 3.9 mm. No suspicious nodule or mass. No focal airspace consolidation. No pleural effusion. Upper Abdomen: Unremarkable. Musculoskeletal: No worrisome lytic or sclerotic osseous abnormality. IMPRESSION: Lung-RADS 2, benign appearance or behavior. Continue annual screening with low-dose chest CT without contrast in 12 months. Emphysema (ICD10-J43.9) and Aortic Atherosclerosis (ICD10-170.0) Electronically Signed   By: Misty Stanley M.D.   On: 10/18/2019 08:13    Assessment & Plan:   Gregery was seen today for diabetes, annual exam and osteoarthritis.  Diagnoses and all orders for this visit:  Type II diabetes mellitus with manifestations ([place])- His A1c is at 5.8%.  His blood sugar is adequately well controlled. -     Basic metabolic panel; Future -     Hepatic function panel; Future -     Hemoglobin A1c; Future -     Hemoglobin A1c -     Hepatic function panel -     Basic metabolic panel  Bile salt-induced diarrhea  Hyperlipidemia LDL goal <100- He has achieved his LDL goal and is doing well on the statin. -     Lipid panel; Future -     Lipid panel  Nocturia associated with benign prostatic hyperplasia- His PSA is low which is a reassuring sign that he does not have prostate  cancer.  He has no symptoms that need to be treated. -     PSA; Future -     PSA  COPD with asthma ([place])- Will continue the triple inhaler. -     CBC with Differential/Platelet; Future -     CBC with Differential/Platelet  GERD with apnea- Will continue the PPI. -     CBC with Differential/Platelet; Future -     CBC with Differential/Platelet  Spinal stenosis in cervical region -     diclofenac (VOLTAREN) 75 MG EC tablet; Take 1 tablet (75 mg total) by mouth 2 (two) times daily.  Routine general medical examination at a health care facility- Exam completed, labs reviewed, vaccines reviewed and updated, cancer screenings are up-to-date, patient education was given.  Osteoarthritis of right ankle and foot -     diclofenac (VOLTAREN) 75 MG EC tablet; Take 1 tablet (75 mg total) by mouth 2 (two) times daily.   I have changed Lajuan Lines. Ackers's diclofenac. I am also having him maintain his ProAir RespiClick, esomeprazole, lamoTRIgine, modafinil, risperiDONE, omega-3 acid ethyl esters, montelukast, LORazepam, rosuvastatin, and Trelegy Ellipta.  Meds ordered this encounter  Medications  . diclofenac (VOLTAREN) 75 MG EC tablet    Sig: Take 1 tablet (75 mg total) by mouth 2 (two) times daily.    Dispense:  180 tablet    Refill:  1   In addition to time spent on CPE, I spent 50 minutes in preparing to see the patient by review of recent labs, imaging and procedures, obtaining and reviewing separately obtained history, communicating with the patient and family or caregiver, ordering medications, tests or procedures, and documenting clinical information in the EHR including the differential Dx, treatment, and any further evaluation and other management of 1. Type II diabetes mellitus with manifestations ([place]) 2. Hyperlipidemia LDL goal <100 3. Nocturia associated with benign prostatic hyperplasia 4. COPD with asthma ([place]) 5. GERD with apnea 6. Spinal stenosis in cervical region 7.  Osteoarthritis of right ankle and foot   Follow-up: Return in about 6 months (around 04/20/2020).  Scarlette Calico, MD

## 2019-10-20 ENCOUNTER — Encounter: Payer: Self-pay | Admitting: Internal Medicine

## 2019-10-20 LAB — CBC WITH DIFFERENTIAL/PLATELET
Basophils Absolute: 0.1 10*3/uL (ref 0.0–0.1)
Basophils Relative: 1.1 % (ref 0.0–3.0)
Eosinophils Absolute: 0.3 10*3/uL (ref 0.0–0.7)
Eosinophils Relative: 4 % (ref 0.0–5.0)
HCT: 40.3 % (ref 39.0–52.0)
Hemoglobin: 14.1 g/dL (ref 13.0–17.0)
Lymphocytes Relative: 31.3 % (ref 12.0–46.0)
Lymphs Abs: 2.7 10*3/uL (ref 0.7–4.0)
MCHC: 35 g/dL (ref 30.0–36.0)
MCV: 84.3 fl (ref 78.0–100.0)
Monocytes Absolute: 0.7 10*3/uL (ref 0.1–1.0)
Monocytes Relative: 8.6 % (ref 3.0–12.0)
Neutro Abs: 4.8 10*3/uL (ref 1.4–7.7)
Neutrophils Relative %: 55 % (ref 43.0–77.0)
Platelets: 154 10*3/uL (ref 150.0–400.0)
RBC: 4.78 Mil/uL (ref 4.22–5.81)
RDW: 13.1 % (ref 11.5–15.5)
WBC: 8.7 10*3/uL (ref 4.0–10.5)

## 2019-10-20 LAB — PSA: PSA: 0.51 ng/mL (ref 0.10–4.00)

## 2019-10-20 LAB — LIPID PANEL
Cholesterol: 112 mg/dL (ref 0–200)
HDL: 38.3 mg/dL — ABNORMAL LOW (ref >39.00)
LDL Cholesterol: 37 mg/dL (ref 0–99)
NonHDL: 74.08
Total CHOL/HDL Ratio: 3
Triglycerides: 186 mg/dL — ABNORMAL HIGH (ref 0.0–149.0)
VLDL: 37.2 mg/dL (ref 0.0–40.0)

## 2019-10-20 LAB — BASIC METABOLIC PANEL
BUN: 22 mg/dL (ref 6–23)
CO2: 31 mEq/L (ref 19–32)
Calcium: 9.9 mg/dL (ref 8.4–10.5)
Chloride: 101 mEq/L (ref 96–112)
Creatinine, Ser: 0.87 mg/dL (ref 0.40–1.50)
GFR: 91.06 mL/min (ref >60.00)
Glucose, Bld: 127 mg/dL — ABNORMAL HIGH (ref 70–99)
Potassium: 4.1 mEq/L (ref 3.5–5.1)
Sodium: 138 mEq/L (ref 135–145)

## 2019-10-20 LAB — HEMOGLOBIN A1C: Hgb A1c MFr Bld: 5.8 % (ref 4.6–6.5)

## 2019-10-20 LAB — HEPATIC FUNCTION PANEL
ALT: 30 U/L (ref 0–53)
AST: 29 U/L (ref 0–37)
Albumin: 4.7 g/dL (ref 3.5–5.2)
Alkaline Phosphatase: 68 U/L (ref 39–117)
Bilirubin, Direct: 0.2 mg/dL (ref 0.0–0.3)
Total Bilirubin: 0.9 mg/dL (ref 0.2–1.2)
Total Protein: 7.3 g/dL (ref 6.0–8.3)

## 2019-10-20 MED ORDER — DICLOFENAC SODIUM 75 MG PO TBEC: 75.0000 mg | DELAYED_RELEASE_TABLET | Freq: Two times a day (BID) | ORAL | 1 refills | Status: DC

## 2019-10-25 ENCOUNTER — Telehealth: Payer: Self-pay | Admitting: Internal Medicine

## 2019-10-25 DIAGNOSIS — F3176 Bipolar disorder, in full remission, most recent episode depressed: Secondary | ICD-10-CM

## 2019-10-25 NOTE — Progress Notes (Signed)
  Chronic Care Management   Outreach Note  10/25/2019 Name: Jeff Norton MRN: [id number] DOB: 1966/01/19  Referred by: [name], MD Reason for referral : No chief complaint on file.   An unsuccessful telephone outreach was attempted today. The patient was referred to the pharmacist for assistance with care management and care coordination.  This note is not being shared with the patient for the following reason: To respect privacy (The patient or proxy has requested that the information not be shared). Follow Up Plan:   Earney Hamburg Upstream Scheduler

## 2019-10-25 NOTE — Progress Notes (Signed)
  Chronic Care Management   Note  10/25/2019 Name: Jeff Norton MRN: [id number] DOB: [birth year]  Jeff Norton is a 54 y.o. year old male who is a primary care patient of [name], MD. I reached out to [name] by phone today in response to a referral sent by Jeff Norton PCP, [name], MD.   Jeff Norton was given information about Chronic Care Management services today including:  1. CCM service includes personalized support from designated clinical staff supervised by his physician, including individualized plan of care and coordination with other care providers 2. 24/7 contact phone numbers for assistance for urgent and routine care needs. 3. Service will only be billed when office clinical staff spend 20 minutes or more in a month to coordinate care. 4. Only one practitioner may furnish and bill the service in a calendar month. 5. The patient may stop CCM services at any time (effective at the end of the month) by phone call to the office staff.   Patient agreed to services and verbal consent obtained.   This note is not being shared with the patient for the following reason: To respect privacy (The patient or proxy has requested that the information not be shared).  Follow up plan:   Earney Hamburg Upstream Scheduler

## 2019-10-30 ENCOUNTER — Other Ambulatory Visit: Payer: Self-pay | Admitting: Internal Medicine

## 2019-10-30 DIAGNOSIS — E785 Hyperlipidemia, unspecified: Secondary | ICD-10-CM

## 2019-11-03 ENCOUNTER — Other Ambulatory Visit: Payer: Self-pay | Admitting: Internal Medicine

## 2019-11-03 DIAGNOSIS — F411 Generalized anxiety disorder: Secondary | ICD-10-CM

## 2019-11-08 DIAGNOSIS — G4733 Obstructive sleep apnea (adult) (pediatric): Secondary | ICD-10-CM | POA: Diagnosis not present

## 2019-12-12 ENCOUNTER — Encounter: Payer: Self-pay | Admitting: Internal Medicine

## 2019-12-12 ENCOUNTER — Other Ambulatory Visit: Payer: Self-pay | Admitting: Internal Medicine

## 2019-12-12 DIAGNOSIS — F3176 Bipolar disorder, in full remission, most recent episode depressed: Secondary | ICD-10-CM

## 2019-12-12 DIAGNOSIS — Z1211 Encounter for screening for malignant neoplasm of colon: Secondary | ICD-10-CM

## 2019-12-12 MED ORDER — LAMOTRIGINE 200 MG PO TABS: 200.0000 mg | ORAL_TABLET | Freq: Two times a day (BID) | ORAL | 1 refills | Status: DC

## 2019-12-16 NOTE — Chronic Care Management (AMB) (Deleted)
Chronic Care Management Pharmacy  Name: Jeff Norton  MRN: [id number] DOB: 07-26-1965   Chief Complaint/ HPI  Jeff Norton,  54 y.o. , male presents for their Initial CCM visit with the clinical pharmacist via telephone due to COVID-19 Pandemic.  PCP : [name], MD Patient Care Team: [name], MD as PCP - General (Internal Medicine) [name], [facility] as Pharmacist (Pharmacist)  Their chronic conditions include: Hyperlipidemia, Diabetes, COPD, Asthma, Anxiety, Osteoarthritis, Tobacco use, BPH and Bipolar Disorder   Office Visits: 10/19/19: Patient presented to Dr. [name] for follow-up. Patient with continued arthralgias and neck pain, otherwise stable. No medication changes made.  Consult Visit: 12/12/19: referral to GI for colonoscopy placed.  10/17/19: Patient presented to [name], FNP for tobacco cessation counseling.  07/08/19: Patient presented to Dr. [name] (Dermatology)  Allergies  Allergen Reactions  . Azithromycin   . Cleocin [Clindamycin Hcl]   . Oxycodone Nausea And Vomiting    Medications: Outpatient Encounter Medications as of 12/20/2019  Medication Sig  . diclofenac (VOLTAREN) 75 MG EC tablet Take 1 tablet (75 mg total) by mouth 2 (two) times daily.  Marland Kitchen esomeprazole (NEXIUM) 20 MG capsule TK 1 C PO QD  . lamoTRIgine (LAMICTAL) 200 MG tablet Take 1 tablet (200 mg total) by mouth 2 (two) times daily.  Marland Kitchen LORazepam (ATIVAN) 1 MG tablet TAKE 1 TABLET(1 MG) BY MOUTH TWICE DAILY AS NEEDED FOR ANXIETY  . modafinil (PROVIGIL) 200 MG tablet TAKE ONE TABLET BY MOUTH ONE TIME DAILY   . montelukast (SINGULAIR) 10 MG tablet TAKE 1 TABLET BY MOUTH AT  BEDTIME  . omega-3 acid ethyl esters (LOVAZA) 1 g capsule Take 2 capsules (2 g total) by mouth 2 (two) times daily.  Marland Kitchen PROAIR RESPICLICK 299 (90 Base) MCG/ACT AEPB INHALE 2 PUFFS INTO THE LUNGS FOUR TIMES DAILY AS NEEDED  . risperiDONE (RISPERDAL) 3 MG tablet   . rosuvastatin (CRESTOR) 10 MG tablet  TAKE 1 TABLET BY MOUTH  DAILY  . TRELEGY ELLIPTA 100-62.5-25 MCG/INH AEPB INHALE 1 PUFF INTO THE LUNGS DAILY  . [DISCONTINUED] diclofenac (VOLTAREN) 75 MG EC tablet TAKE 1 TABLET BY MOUTH  TWICE DAILY  . [DISCONTINUED] rosuvastatin (CRESTOR) 10 MG tablet TAKE 1 TABLET BY MOUTH  DAILY   No facility-administered encounter medications on file as of 12/20/2019.   Current Diagnosis/Assessment:   Goals Addressed   None     COPD / Asthma    Last spirometry score: ***  Gold Grade: {CHL HP Upstream Pharm COPD Gold MEQAS:[id number]} Current COPD Classification:  {CHL HP Upstream Pharm COPD [phone]}  Eosinophil count:   Lab Results  Component Value Date/Time   EOSPCT 4.0 10/19/2019 04:23 PM  %                               Eos (Absolute):  Lab Results  Component Value Date/Time   EOSABS 0.3 10/19/2019 04:23 PM   Patient has failed these meds in past: *** Patient is currently {CHL Controlled/Uncontrolled:7873989132} on the following medications:  . Proair Respiclick 989 mcg/act 2 puff QID PRN  . Trelegy Ellipta 1 puff daily (LF 08/28/19 30DS)  . Montelukast 10 mg QHS  Using maintenance inhaler regularly? {yes/no:20286} Frequency of rescue inhaler use:  {CHL HP Upstream Pharm Inhaler QJJH:4174081448}  We discussed:  {CHL HP Upstream Pharmacy discussion:[phone]}  Plan  Continue {CHL HP Upstream Pharmacy JEHUD:[id number]}  Tobacco Abuse   Tobacco Status:  Social History   Tobacco Use  Smoking Status Current Every Day Smoker  . Packs/day: 1.00  . Years: 30.00  . Pack years: 30.00  . Types: Cigarettes  Smokeless Tobacco Never Used  Tobacco Comment   5cigs a day as of 05/21/17 ep    Patient smokes {Time to first cigarette:23873} Patient triggers include: {Smoking Triggers:23882} On a scale of 1-10, reports MOTIVATION to quit is *** On a scale of 1-10, reports CONFIDENCE in quitting is ***  Patient has failed these meds in past: *** Patient is  currently {CHL Controlled/Uncontrolled:[phone]} on the following medications:  . ***  We discussed:  {Smoking Cessation Counseling:23883}  Plan  Continue {CHL HP Upstream Pharmacy Plans:[phone]}  Diabetes   A1c goal {A1c goals:23924}  Recent Relevant Labs: Lab Results  Component Value Date/Time   HGBA1C 5.8 10/19/2019 04:23 PM   HGBA1C 6.0 12/02/2018 04:13 PM   GFR 91.06 10/19/2019 04:23 PM   GFR 105.18 12/02/2018 04:13 PM   MICROALBUR 5.7 (H) 12/02/2018 04:13 PM   MICROALBUR 4.8 (H) 02/19/2017 09:45 AM    Last diabetic Eye exam:  Lab Results  Component Value Date/Time   HMDIABEYEEXA No Retinopathy 01/07/2019 12:00 AM    Last diabetic Foot exam: No results found for: HMDIABFOOTEX   Checking BG: {CHL HP Blood Glucose Monitoring Frequency:[phone]}  Recent FBG Readings: *** Recent pre-meal BG readings: *** Recent 2hr PP BG readings:  *** Recent HS BG readings: ***  Patient has failed these meds in past: n/a Patient is currently {CHL Controlled/Uncontrolled:[phone]} on the following medications: . none  We discussed: {CHL HP Upstream Pharmacy [phone]}  Plan  Continue {CHL HP Upstream Pharmacy Plans:[phone]}  Hyperlipidemia   LDL goal < ***  Lipid Panel     Component Value Date/Time   CHOL 112 10/19/2019 1623   TRIG 186.0 (H) 10/19/2019 1623   HDL 38.30 (L) 10/19/2019 1623   LDLCALC 37 10/19/2019 1623   LDLDIRECT 59.0 12/02/2018 1613    Hepatic Function Latest Ref Rng & Units 10/19/2019 12/02/2018 02/25/2018  Total Protein 6.0 - 8.3 g/dL 7.3 7.1 7.9  Albumin 3.5 - 5.2 g/dL 4.7 4.7 4.8  AST 0 - 37 U/L 29 23 36  ALT 0 - 53 U/L 30 29 38  Alk Phosphatase 39 - 117 U/L 68 55 59  Total Bilirubin 0.2 - 1.2 mg/dL 0.9 0.9 1.0  Bilirubin, Direct 0.0 - 0.3 mg/dL 0.2 - -     The ASCVD Risk score ([place]cad[place]., et al., 2013) failed to calculate for the following reasons:   The valid total cholesterol range is 130 to 320 mg/dL   Patient has  failed these meds in past: *** Patient is currently {CHL Controlled/Uncontrolled:[phone]} on the following medications:  . Lovaza 1g 2 caps BID  . Rosuvastatin 10 mg daily  We discussed:  {CHL HP Upstream Pharmacy [phone]}  Plan  Continue {CHL HP Upstream Pharmacy Plans:[phone]}  Anxiety / Bipolar 1  Disorder   PHQ9 Score:  PHQ9 SCORE ONLY 10/19/2019 12/02/2018 01/09/2017  PHQ-9 Total Score 2 3 2    GAD7 Score: No flowsheet data found.  Patient has failed these meds in past: *** Patient is currently {CHL Controlled/Uncontrolled:[phone]} on the following medications:  . Lamotrigine 200 mg BID  . Lorazepam 1 mg BID PRN  . Modafinil 200 mg daily  . Risperidone 3 mg   We discussed:  ***  Plan  Continue {CHL HP Upstream Pharmacy VVZSM:[id number]}   GERD   Patient has failed these meds  in past: *** Patient is currently {CHL Controlled/[phone]} on the following medications:  . Esomeprazole 20 mg daily   We discussed:  ***  Plan  Continue {CHL HP Upstream Pharmacy [phone]}   Misc / OTC   Patient has failed these meds in past: *** Patient is currently {CHL Controlled/[phone]} on the following medications:  . Diclofenac 75 mg BID  We discussed:  ***  Plan  Continue {CHL HP Upstream Pharmacy LYTSS:[id number]}  Vaccines   Reviewed and discussed patient's vaccination history.    Immunization History  Administered Date(s) Administered  . Fluad Quad(high Dose 65+) 02/28/2019  . Influenza,inj,Quad PF,6+ Mos 04/14/2016, 02/19/2017, 02/25/2018  . Influenza-Unspecified 08/11/2015  . PFIZER SARS-COV-2 Vaccination 08/30/2019, 09/10/2019  . Pneumococcal Polysaccharide-23 11/07/2015  . Tdap 07/25/2015  . Zoster Recombinat (Shingrix) 06/26/2017, 09/04/2017    Plan  Recommended patient receive *** vaccine in *** office.   Medication Management   [facility] for all medications.  There is a >5 day fill gap for:  Trelegy (LF 08/28/19 30DS)  Uses pill box? {Yes or If no, why not?:20788} Pt endorses ***% compliance  We discussed: ***  Plan  {US Pharmacy EQUH:48830}    Follow up: *** month phone visit  ***

## 2019-12-20 ENCOUNTER — Other Ambulatory Visit: Payer: Self-pay

## 2019-12-20 ENCOUNTER — Other Ambulatory Visit: Payer: Self-pay | Admitting: Internal Medicine

## 2019-12-20 ENCOUNTER — Ambulatory Visit: Payer: Medicare Other | Admitting: Pharmacist

## 2019-12-20 DIAGNOSIS — J449 Chronic obstructive pulmonary disease, unspecified: Secondary | ICD-10-CM

## 2019-12-20 DIAGNOSIS — Z720 Tobacco use: Secondary | ICD-10-CM

## 2019-12-20 DIAGNOSIS — E785 Hyperlipidemia, unspecified: Secondary | ICD-10-CM

## 2019-12-20 DIAGNOSIS — J4489 Other specified chronic obstructive pulmonary disease: Secondary | ICD-10-CM

## 2019-12-20 DIAGNOSIS — M19071 Primary osteoarthritis, right ankle and foot: Secondary | ICD-10-CM

## 2019-12-20 MED ORDER — CHANTIX STARTING MONTH PAK 0.5 MG X 11 & 1 MG X 42 PO TABS: ORAL_TABLET | ORAL | 0 refills | Status: DC

## 2019-12-20 MED ORDER — VARENICLINE TARTRATE 1 MG PO TABS: 1.0000 mg | ORAL_TABLET | Freq: Two times a day (BID) | ORAL | 3 refills | Status: DC

## 2019-12-20 NOTE — Chronic Care Management (AMB) (Signed)
Chronic Care Management Pharmacy  Name: Jeff Norton  MRN: [id number] DOB: [birth year]   Chief Complaint/ HPI  Jeff Norton,  54 y.o. , male presents for their Initial CCM visit with the clinical pharmacist via telephone due to COVID-19 Pandemic.  PCP : [name], MD Patient Care Team: [name], MD as PCP - General (Internal Medicine) [name], [facility] as Pharmacist (Pharmacist)  Their chronic conditions include: Hyperlipidemia, Diabetes, COPD, Asthma, Anxiety, Osteoarthritis, Tobacco use, BPH and Bipolar Disorder   Office Visits: 10/19/19: Patient presented to Dr. [name] for follow-up. Patient with continued arthralgias and neck pain, otherwise stable. No medication changes made.  Consult Visit: 12/12/19: referral to GI for colonoscopy placed.  10/17/19: Patient presented to [name], FNP for tobacco cessation counseling.  07/08/19: Patient presented to Dr. [name] (Dermatology)  Allergies  Allergen Reactions  . Nicotine Polacrilex Rash    Nicotine patch causes rash  . Azithromycin   . Cleocin [Clindamycin Hcl]   . Oxycodone Nausea And Vomiting    Medications: Outpatient Encounter Medications as of 12/20/2019  Medication Sig  . diclofenac (VOLTAREN) 75 MG EC tablet Take 1 tablet (75 mg total) by mouth 2 (two) times daily.  Marland Kitchen lamoTRIgine (LAMICTAL) 200 MG tablet Take 1 tablet (200 mg total) by mouth 2 (two) times daily.  Marland Kitchen LORazepam (ATIVAN) 1 MG tablet TAKE 1 TABLET(1 MG) BY MOUTH TWICE DAILY AS NEEDED FOR ANXIETY  . modafinil (PROVIGIL) 200 MG tablet TAKE ONE TABLET BY MOUTH ONE TIME DAILY   . montelukast (SINGULAIR) 10 MG tablet TAKE 1 TABLET BY MOUTH AT  BEDTIME  . PROAIR RESPICLICK 768 (90 Base) MCG/ACT AEPB INHALE 2 PUFFS INTO THE LUNGS FOUR TIMES DAILY AS NEEDED  . risperiDONE (RISPERDAL) 3 MG tablet   . rosuvastatin (CRESTOR) 10 MG tablet TAKE 1 TABLET BY MOUTH  DAILY  . TRELEGY ELLIPTA 100-62.5-25 MCG/INH AEPB INHALE 1 PUFF INTO THE LUNGS  DAILY  . esomeprazole (NEXIUM) 20 MG capsule TK 1 C PO QD (Patient not taking: Reported on 12/20/2019)  . omega-3 acid ethyl esters (LOVAZA) 1 g capsule Take 2 capsules (2 g total) by mouth 2 (two) times daily.  . [DISCONTINUED] diclofenac (VOLTAREN) 75 MG EC tablet TAKE 1 TABLET BY MOUTH  TWICE DAILY  . [DISCONTINUED] rosuvastatin (CRESTOR) 10 MG tablet TAKE 1 TABLET BY MOUTH  DAILY   No facility-administered encounter medications on file as of 12/20/2019.   Current Diagnosis/Assessment:  SDOH Interventions     Most Recent Value  SDOH Interventions  Financial Strain Interventions Other (Comment)  [Trelegy PAP]     Goals Addressed            This Visit's Progress   . Pharmacy Care Plan       CARE PLAN ENTRY (see longitudinal plan of care for additional care plan information)  Current Barriers:  . Chronic Disease Management support, education, and care coordination needs related to Hyperlipidemia, COPD, and Tobacco use  Hyperlipidemia Lab Results  Component Value Date/Time   LDLCALC 37 10/19/2019 04:23 PM   TRIG 186.0 (H) 10/19/2019 04:23 PM .  Pharmacist Clinical Goal(s): o Over the next 30 days, patient will work with PharmD and providers to maintain LDL goal < 100 . Current regimen:  o Rosuvastatin 10 mg daily o OTC fish oil 2000 mg daily . Interventions: o Discussed cholesterol goals and benefits of medication for prevention of heart attack / stroke . Patient self care activities - Over the next  30 days, patient will: o Continue medication as prescribed o Continue low cholesterol/triglyceride diet  COPD . Pharmacist Clinical Goal(s) o Over the next 30 days, patient will work with PharmD and providers to optimize therapy and reduce costs . Current regimen:  o Proair Respiclick 244 mcg/act 2 puff  every 4-6 hrs as needed  o Trelegy Ellipta 1 puff daily o Montelukast 10 mg at bedtime . Interventions: o Pursue patient assistance for Trelegy through [place] . Patient self  care activities - Over the next 30 days, patient will: o Bring income and prescription cost documentation to office and sign Trelegy application  Tobacco Use . Pharmacist Clinical Goal(s) o Over the next 30 days, patient will work with PharmD and providers to quit smoking . Current regimen:  o No medications . Interventions: o Discussed benefits of Chantix and relatively low risk of psychiatric side effects . Patient self care activities - Over the next 30 days, patient will: o Start Chantix - Starting Month Box o Have last cigarette during first month of taking Chantix  Medication management . Pharmacist Clinical Goal(s): o Over the next 30 days, patient will work with PharmD and providers to maintain optimal medication adherence . Current [facility] . Interventions o Comprehensive medication review performed. o Continue current medication management strategy . Patient self care activities - Over the next 30 days, patient will: o Focus on medication adherence by pill box o Take medications as prescribed o Report any questions or concerns to PharmD and/or provider(s)  Initial goal documentation       COPD / Asthma    Last spirometry score: FEV1 57%; FEV1/FVC 0.61  Gold Grade: Gold 2 (FEV1 50-79%) Current COPD Classification:  A (low sx, <2 exacerbations/yr)  Lab Results  Component Value Date/Time   EOSPCT 4.0 10/19/2019 04:23 PM   EOSABS 0.3 10/19/2019 04:23 PM   Patient has failed these meds in past: n/a Patient is currently controlled on the following medications:  . Proair Respiclick 010 mcg/act 2 puff QID PRN  . Trelegy Ellipta 1 puff daily (LF 08/28/19 30DS) - QOD . Montelukast 10 mg QHS  Using maintenance inhaler regularly? Yes Frequency of rescue inhaler use:  infrequently  We discussed:  proper inhaler technique; pt is currently taking Trelegy every other day and reports symptoms are controlled. Trelegy is ~$47 per month but he  will be in donut hole soon and price will increase to $100s. [employer] PAP requires income < 250% FPL and prescription costs > $600 to qualify for free medication. Pt reports he meets these requirements, so we will pursue PAP for Trelegy.  Plan  Continue current medications  Pursue Trelegy PAP  Tobacco Abuse   Tobacco Status:  Social History   Tobacco Use  Smoking Status Current Every Day Smoker  . Packs/day: 1.00  . Years: 30.00  . Pack years: 30.00  . Types: Cigarettes  Smokeless Tobacco Never Used  Tobacco Comment   5cigs a day as of 05/21/17 ep   Pt has tried to quit several times in the past but has never been successful. reports MOTIVATION to quit is high reports CONFIDENCE in quitting is low  Patient has failed these meds in past: Chantix (only took for a few days, then stopped when a friend told him it may worsen depression), nicotine patch (allergies), lozenges Patient is currently uncontrolled on the following medications:  . No medications  We discussed:  Chantix and relative risks of psychiatric side effects; more recent  studies of patients with psychiatric disorders do show benefit with Chantix with low risk of increased depression or suicidal ideation. Pt reports "I know myself" and will stop if he experiences any worsening psychiatric symptoms.  Plan  Recommend Chantix - Starting Month Box x 28 days followed by Continuing month box for 3-6 months total until smoking cessation is maintained  Prediabetes   A1c goal <7%  Recent Relevant Labs: Lab Results  Component Value Date/Time   HGBA1C 5.8 10/19/2019 04:23 PM   HGBA1C 6.0 12/02/2018 04:13 PM   GFR 91.06 10/19/2019 04:23 PM   GFR 105.18 12/02/2018 04:13 PM   MICROALBUR 5.7 (H) 12/02/2018 04:13 PM   MICROALBUR 4.8 (H) 02/19/2017 09:45 AM    Last diabetic Eye exam:  Lab Results  Component Value Date/Time   HMDIABEYEEXA No Retinopathy 01/07/2019 12:00 AM    Last diabetic Foot exam: No results found for:  HMDIABFOOTEX   No medication indicated  We discussed: diet and exercise extensively;   Plan  Continue control with diet and exercise  Hyperlipidemia   LDL goal < 100 TG goal < 150  Lipid Panel     Component Value Date/Time   CHOL 112 10/19/2019 1623   TRIG 186.0 (H) 10/19/2019 1623   HDL 38.30 (L) 10/19/2019 1623   LDLCALC 37 10/19/2019 1623   LDLDIRECT 59.0 12/02/2018 1613    Hepatic Function Latest Ref Rng & Units 10/19/2019 12/02/2018 02/25/2018  Total Protein 6.0 - 8.3 g/dL 7.3 7.1 7.9  Albumin 3.5 - 5.2 g/dL 4.7 4.7 4.8  AST 0 - 37 U/L 29 23 36  ALT 0 - 53 U/L 30 29 38  Alk Phosphatase 39 - 117 U/L 68 55 59  Total Bilirubin 0.2 - 1.2 mg/dL 0.9 0.9 1.0  Bilirubin, Direct 0.0 - 0.3 mg/dL 0.2 - -    The ASCVD Risk score Mikey Bussing DC Jr., et al., 2013) failed to calculate for the following reasons:   The valid total cholesterol range is 130 to 320 mg/dL   Patient has failed these meds in past: n/a Patient is currently controlled on the following medications:  . Rosuvastatin 10 mg daily . Fish Oil 2000 mg daily  We discussed:  diet and exercise extensively; benefits of statin for ASCVD prevention  Plan  Continue current medications and control with diet and exercise  Anxiety / Bipolar 1  Disorder   Depression screen [facility] 2/9 10/19/2019 12/02/2018 01/09/2017  Decreased Interest _0 Down, Depressed, Hopeless 0 0 1  PHQ - 2 Score _1 Altered sleeping 1 1 0  Tired, decreased energy 0 1 0  Change in appetite 0 0 0  Feeling bad or failure about yourself  0 0 0  Trouble concentrating 0 0 0  Moving slowly or fidgety/restless 0 0 0  Suicidal thoughts 0 0 0  PHQ-9 Score _2 Difficult doing work/chores Not difficult at all Not difficult at all Not difficult at all   GAD7 Score: No flowsheet data found.  Patient has failed these meds in past: Abilfy, carbamazepine, clonazepam,  Patient is currently controlled on the following medications:  . Lamotrigine 200 mg BID    . Lorazepam 1 mg BID PRN  . Modafinil 200 mg daily  . Risperidone 3 mg BID  We discussed:  Prescribed by [facility] health, pt reports Bipolar symptoms are well controlled on current meds. Discussed side effects of risperidone including metabolic syndrome, counseled to focus on diet/lifestyle modification as  well as adherence with cholesterol medications to prevent issues.   Plan  Continue current medications   GERD   Patient has failed these meds in past: esmeprazole Patient is currently controlled on the following medications:  No medications  We discussed: Pt has not been taking PPI or H2RA, denies issues with heartburn/reflux.   Plan  Continue to monitor for sx  Osteoarthritis   Patient has failed these meds in past: n/a Patient is currently controlled on the following medications:  . Diclofenac 75 mg BID  We discussed:  Pt reports arthritis pain is well controlled with diclofenac, "you can't pry these pills out of my cold dead hands". Discussed safety issues with long term NSAID use including GI bleed, HTN, and kidney damage. Pt agreed to take with food to prevent GI issues.  Plan  Continue current medications - take with food  Health Maintenance   Patient is currently controlled on the following medications:  . Cetirizine 10 mg . Vitamin D3 . Multivitamin  We discussed:  Patient is satisfied with current OTC regimen and denies issues  Plan  Continue current medications  Medication Management   [facility] for all medications. Uses pill box? Yes Pt endorses 100% compliance  We discussed: Pt receives maintenance meds through mail order pharmacy, he denies issues with managing meds this way.   Plan  Continue current medication management strategy    Follow up: 1 month phone visit  Charlene Brooke, PharmD Clinical Pharmacist [facility] [phone]

## 2019-12-20 NOTE — Addendum Note (Signed)
Addended by: Aviva Signs M on: 12/20/2019 03:12 PM   Modules accepted: Orders

## 2019-12-21 NOTE — Patient Instructions (Addendum)
Visit Information  Phone number for Pharmacist: [phone]  Thank you for meeting with me to discuss your medications! I look forward to working with you to achieve your health care goals. Below is a summary of what we talked about during the visit:  Goals Addressed            This Visit's Progress   . Pharmacy Care Plan       CARE PLAN ENTRY (see longitudinal plan of care for additional care plan information)  Current Barriers:  . Chronic Disease Management support, education, and care coordination needs related to Hyperlipidemia, COPD, Osteoarthritis, and Tobacco use  Hyperlipidemia Lab Results  Component Value Date/Time   LDLCALC 37 10/19/2019 04:23 PM   TRIG 186.0 (H) 10/19/2019 04:23 PM .  Pharmacist Clinical Goal(s): o Over the next 30 days, patient will work with PharmD and providers to maintain LDL goal < 100 . Current regimen:  o Rosuvastatin 10 mg daily o OTC fish oil 2000 mg daily . Interventions: o Discussed cholesterol goals and benefits of medication for prevention of heart attack / stroke . Patient self care activities - Over the next 30 days, patient will: o Continue medication as prescribed o Continue low cholesterol/triglyceride diet  COPD . Pharmacist Clinical Goal(s) o Over the next 30 days, patient will work with PharmD and providers to optimize therapy and reduce costs . Current regimen:  o Proair Respiclick 163 mcg/act 2 puff  every 4-6 hrs as needed  o Trelegy Ellipta 1 puff daily o Montelukast 10 mg at bedtime . Interventions: o Pursue patient assistance for Trelegy through [place] . Patient self care activities - Over the next 30 days, patient will: o Bring income and prescription cost documentation to office and sign Trelegy application  Tobacco Use . Pharmacist Clinical Goal(s) o Over the next 30 days, patient will work with PharmD and providers to quit smoking . Current regimen:  o No medications . Interventions: o Discussed benefits of  Chantix and relatively low risk of psychiatric side effects . Patient self care activities - Over the next 30 days, patient will: o Start Chantix - Starting Month Box o Have last cigarette during first month of taking Chantix  Osteoarthritis . Pharmacist Clinical Goal(s) o Over the next 30 days, patient will work with PharmD and providers to optimize therapy . Current regimen:  o Diclofenac 75 mg twice a day . Interventions: o Discussed safety issues with long term use, including GI bleeding/ulcers; this risk can be mitigated by taking with food . Patient self care activities - Over the next 30 days, patient will: o Take diclofenac with food  Medication management . Pharmacist Clinical Goal(s): o Over the next 30 days, patient will work with PharmD and providers to maintain optimal medication adherence . Current pharmacy: [facility], [facility] . Interventions o Comprehensive medication review performed. o Continue current medication management strategy . Patient self care activities - Over the next 30 days, patient will: o Focus on medication adherence by pill box o Take medications as prescribed o Report any questions or concerns to PharmD and/or provider(s)  Initial goal documentation       Mr. Burgher was given information about Chronic Care Management services today including:  1. CCM service includes personalized support from designated clinical staff supervised by his physician, including individualized plan of care and coordination with other care providers 2. 24/7 contact phone numbers for assistance for urgent and routine care needs. 3. Standard insurance, coinsurance, copays and deductibles  apply for chronic care management only during months in which we provide at least 20 minutes of these services. Most insurances cover these services at 100%, however patients may be responsible for any copay, coinsurance and/or deductible if applicable. This service may help  you avoid the need for more expensive face-to-face services. 4. Only one practitioner may furnish and bill the service in a calendar month. 5. The patient may stop CCM services at any time (effective at the end of the month) by phone call to the office staff.  Patient agreed to services and verbal consent obtained.   The patient verbalized understanding of instructions provided today and agreed to receive a mailed copy of patient instruction and/or educational materials. Telephone follow up appointment with pharmacy team member scheduled for: 1 month  Charlene Brooke, PharmD Clinical Pharmacist [facility] [phone]  Coping with Quitting Smoking  Quitting smoking is a physical and mental challenge. You will face cravings, withdrawal symptoms, and temptation. Before quitting, work with your health care provider to make a plan that can help you cope. Preparation can help you quit and keep you from giving in. How can I cope with cravings? Cravings usually last for 5-10 minutes. If you get through it, the craving will pass. Consider taking the following actions to help you cope with cravings:  Keep your mouth busy: ? Chew sugar-free gum. ? Suck on hard candies or a straw. ? Brush your teeth.  Keep your hands and body busy: ? Immediately change to a different activity when you feel a craving. ? Squeeze or play with a ball. ? Do an activity or a hobby, like making bead jewelry, practicing needlepoint, or working with wood. ? Mix up your normal routine. ? Take a short exercise break. Go for a quick walk or run up and down stairs. ? Spend time in public places where smoking is not allowed.  Focus on doing something kind or helpful for someone else.  Call a friend or family member to talk during a craving.  Join a support group.  Call a quit line, such as 1-800-QUIT-NOW.  Talk with your health care provider about medicines that might help you cope with  cravings and make quitting easier for you. How can I deal with withdrawal symptoms? Your body may experience negative effects as it tries to get used to not having nicotine in the system. These effects are called withdrawal symptoms. They may include:  Feeling hungrier than normal.  Trouble concentrating.  Irritability.  Trouble sleeping.  Feeling depressed.  Restlessness and agitation.  Craving a cigarette. To manage withdrawal symptoms:  Avoid places, people, and activities that trigger your cravings.  Remember why you want to quit.  Get plenty of sleep.  Avoid coffee and other caffeinated drinks. These may worsen some of your symptoms. How can I handle social situations? Social situations can be difficult when you are quitting smoking, especially in the first few weeks. To manage this, you can:  Avoid parties, bars, and other social situations where people might be smoking.  Avoid alcohol.  Leave right away if you have the urge to smoke.  Explain to your family and friends that you are quitting smoking. Ask for understanding and support.  Plan activities with friends or family where smoking is not an option. What are some ways I can cope with stress? Wanting to smoke may cause stress, and stress can make you want to smoke. Find ways to manage your stress. Relaxation  techniques can help. For example:  Breathe slowly and deeply, in through your nose and out through your mouth.  Listen to soothing, relaxing music.  Talk with a family member or friend about your stress.  Light a candle.  Soak in a bath or take a shower.  Think about a peaceful place. What are some ways I can prevent weight gain? Be aware that many people gain weight after they quit smoking. However, not everyone does. To keep from gaining weight, have a plan in place before you quit and stick to the plan after you quit. Your plan should include:  Having healthy snacks. When you have a craving, it  may help to: ? Eat plain popcorn, crunchy carrots, celery, or other cut vegetables. ? Chew sugar-free gum.  Changing how you eat: ? Eat small portion sizes at meals. ? Eat 4-6 small meals throughout the day instead of 1-2 large meals a day. ? Be mindful when you eat. Do not watch television or do other things that might distract you as you eat.  Exercising regularly: ? Make time to exercise each day. If you do not have time for a long workout, do short bouts of exercise for 5-10 minutes several times a day. ? Do some form of strengthening exercise, like weight lifting, and some form of aerobic exercise, like running or swimming.  Drinking plenty of water or other low-calorie or no-calorie drinks. Drink 6-8 glasses of water daily, or as much as instructed by your health care provider. Summary  Quitting smoking is a physical and mental challenge. You will face cravings, withdrawal symptoms, and temptation to smoke again. Preparation can help you as you go through these challenges.  You can cope with cravings by keeping your mouth busy (such as by chewing gum), keeping your body and hands busy, and making calls to family, friends, or a helpline for people who want to quit smoking.  You can cope with withdrawal symptoms by avoiding places where people smoke, avoiding drinks with caffeine, and getting plenty of rest.  Ask your health care provider about the different ways to prevent weight gain, avoid stress, and handle social situations. This information is not intended to replace advice given to you by your health care provider. Make sure you discuss any questions you have with your health care provider. Document Revised: 05/15/2017 Document Reviewed: 05/30/2016 Elsevier Patient Education  2020 [employer].

## 2019-12-22 MED ORDER — TRELEGY ELLIPTA 100-62.5-25 MCG/INH IN AEPB: 1.0000 | INHALATION_SPRAY | Freq: Every day | RESPIRATORY_TRACT | 3 refills | Status: DC

## 2019-12-27 DIAGNOSIS — Z20822 Contact with and (suspected) exposure to covid-19: Secondary | ICD-10-CM | POA: Diagnosis not present

## 2019-12-27 DIAGNOSIS — Z03818 Encounter for observation for suspected exposure to other biological agents ruled out: Secondary | ICD-10-CM | POA: Diagnosis not present

## 2019-12-28 ENCOUNTER — Other Ambulatory Visit (INDEPENDENT_AMBULATORY_CARE_PROVIDER_SITE_OTHER): Payer: Medicare Other

## 2019-12-28 ENCOUNTER — Other Ambulatory Visit: Payer: Self-pay

## 2019-12-28 ENCOUNTER — Telehealth (INDEPENDENT_AMBULATORY_CARE_PROVIDER_SITE_OTHER): Payer: Medicare Other | Admitting: Family

## 2019-12-28 ENCOUNTER — Ambulatory Visit (INDEPENDENT_AMBULATORY_CARE_PROVIDER_SITE_OTHER)
Admission: RE | Admit: 2019-12-28 | Discharge: 2019-12-28 | Disposition: A | Discharge status: Home / Self Care | Payer: Medicare Other | Source: Ambulatory Visit | Attending: Family | Admitting: Family

## 2019-12-28 DIAGNOSIS — J189 Pneumonia, unspecified organism: Secondary | ICD-10-CM | POA: Diagnosis not present

## 2019-12-28 DIAGNOSIS — R7989 Other specified abnormal findings of blood chemistry: Secondary | ICD-10-CM | POA: Diagnosis not present

## 2019-12-28 DIAGNOSIS — R509 Fever, unspecified: Secondary | ICD-10-CM

## 2019-12-28 DIAGNOSIS — R350 Frequency of micturition: Secondary | ICD-10-CM | POA: Diagnosis not present

## 2019-12-28 DIAGNOSIS — R7309 Other abnormal glucose: Secondary | ICD-10-CM | POA: Diagnosis not present

## 2019-12-28 LAB — CBC WITH DIFFERENTIAL/PLATELET
Basophils Absolute: 0 10*3/uL (ref 0.0–0.1)
Basophils Relative: 0.4 % (ref 0.0–3.0)
Eosinophils Absolute: 0 10*3/uL (ref 0.0–0.7)
Eosinophils Relative: 0.2 % (ref 0.0–5.0)
HCT: 41.1 % (ref 39.0–52.0)
Hemoglobin: 14.6 g/dL (ref 13.0–17.0)
Lymphocytes Relative: 10.5 % — ABNORMAL LOW (ref 12.0–46.0)
Lymphs Abs: 0.6 10*3/uL — ABNORMAL LOW (ref 0.7–4.0)
MCHC: 35.5 g/dL (ref 30.0–36.0)
MCV: 82.2 fl (ref 78.0–100.0)
Monocytes Absolute: 0.5 10*3/uL (ref 0.1–1.0)
Monocytes Relative: 8.4 % (ref 3.0–12.0)
Neutro Abs: 4.7 10*3/uL (ref 1.4–7.7)
Neutrophils Relative %: 80.5 % — ABNORMAL HIGH (ref 43.0–77.0)
Platelets: 97 10*3/uL — ABNORMAL LOW (ref 150.0–400.0)
RBC: 5 Mil/uL (ref 4.22–5.81)
RDW: 12.9 % (ref 11.5–15.5)
WBC: 5.9 10*3/uL (ref 4.0–10.5)

## 2019-12-28 LAB — COMPREHENSIVE METABOLIC PANEL
ALT: 81 U/L — ABNORMAL HIGH (ref 0–53)
AST: 65 U/L — ABNORMAL HIGH (ref 0–37)
Albumin: 4.4 g/dL (ref 3.5–5.2)
Alkaline Phosphatase: 97 U/L (ref 39–117)
BUN: 12 mg/dL (ref 6–23)
CO2: 27 mEq/L (ref 19–32)
Calcium: 9.1 mg/dL (ref 8.4–10.5)
Chloride: 97 mEq/L (ref 96–112)
Creatinine, Ser: 0.75 mg/dL (ref 0.40–1.50)
GFR: 108 mL/min (ref >60.00)
Glucose, Bld: 182 mg/dL — ABNORMAL HIGH (ref 70–99)
Potassium: 4 mEq/L (ref 3.5–5.1)
Sodium: 131 mEq/L — ABNORMAL LOW (ref 135–145)
Total Bilirubin: 1.5 mg/dL — ABNORMAL HIGH (ref 0.2–1.2)
Total Protein: 7 g/dL (ref 6.0–8.3)

## 2019-12-28 LAB — URINALYSIS, ROUTINE W REFLEX MICROSCOPIC
Bilirubin Urine: NEGATIVE
Ketones, ur: NEGATIVE
Leukocytes,Ua: NEGATIVE
Nitrite: NEGATIVE
Specific Gravity, Urine: <=1.005 — AB (ref 1.000–1.030)
Total Protein, Urine: NEGATIVE
Urine Glucose: NEGATIVE
Urobilinogen, UA: 0.2 (ref 0.0–1.0)
pH: 6 (ref 5.0–8.0)

## 2019-12-28 LAB — PSA: PSA: 0.92 ng/mL (ref 0.10–4.00)

## 2019-12-28 MED ORDER — DOXYCYCLINE HYCLATE 100 MG PO TABS
100.0000 mg | ORAL_TABLET | Freq: Two times a day (BID) | ORAL | 0 refills | Status: DC
Start: 2019-12-28 — End: 2020-01-11

## 2019-12-28 NOTE — Progress Notes (Signed)
Jeff Norton is a 54 y.o. male with the following history as recorded in EpicCare:  Patient Active Problem List   Diagnosis Date Noted  . Continuous tobacco abuse 12/20/2019  . GERD with apnea 10/19/2019  . Abnormal electrocardiogram (ECG) (EKG) 12/02/2018  . GAD (generalized anxiety disorder) 09/29/2018  . Nocturia associated with benign prostatic hyperplasia 02/25/2018  . Bipolar 1 disorder, depressed, full remission ([place]) 11/20/2016  . Spinal stenosis in cervical region 09/18/2016  . Sleep apnea with hypersomnolence 08/19/2016  . Bile salt-induced diarrhea 01/02/2016  . Chronic pansinusitis 01/01/2016  . COPD with asthma ([place]) 11/07/2015  . Type II diabetes mellitus with manifestations ([place]) 11/07/2015  . Routine general medical examination at a health care facility 11/07/2015  . Obesity (BMI 30.0-34.9) 02/24/2012  . Smokers' cough ([place]) 02/24/2012  . Osteoarthritis of ankle and foot 10/08/2007  . Hyperlipidemia LDL goal <100 04/16/2007  . Allergic rhinitis due to pollen 04/16/2007  . Asthma, mild intermittent, poorly controlled 04/16/2007  . OSA on CPAP 04/16/2007    Current Outpatient Medications  Medication Sig Dispense Refill  . diclofenac (VOLTAREN) 75 MG EC tablet Take 1 tablet (75 mg total) by mouth 2 (two) times daily. 180 tablet 1  . Fluticasone-Umeclidin-Vilant (TRELEGY ELLIPTA) 100-62.5-25 MCG/INH AEPB Inhale 1 puff into the lungs daily. 120 each 3  . lamoTRIgine (LAMICTAL) 200 MG tablet Take 1 tablet (200 mg total) by mouth 2 (two) times daily. 180 tablet 1  . LORazepam (ATIVAN) 1 MG tablet TAKE 1 TABLET(1 MG) BY MOUTH TWICE DAILY AS NEEDED FOR ANXIETY 60 tablet 5  . modafinil (PROVIGIL) 200 MG tablet TAKE ONE TABLET BY MOUTH ONE TIME DAILY  30 tablet 4  . montelukast (SINGULAIR) 10 MG tablet TAKE 1 TABLET BY MOUTH AT  BEDTIME 90 tablet 1  . PROAIR RESPICLICK 161 (90 Base) MCG/ACT AEPB INHALE 2 PUFFS INTO THE LUNGS FOUR TIMES DAILY AS NEEDED 1 each 5  .  risperiDONE (RISPERDAL) 3 MG tablet     . rosuvastatin (CRESTOR) 10 MG tablet TAKE 1 TABLET BY MOUTH  DAILY 90 tablet 1  . varenicline (CHANTIX CONTINUING MONTH PAK) 1 MG tablet Take 1 tablet (1 mg total) by mouth 2 (two) times daily. 60 tablet 3  . varenicline (CHANTIX STARTING MONTH PAK) 0.5 MG X 11 & 1 MG X 42 tablet Take one 0.5 mg tablet by mouth once daily for 3 days, then increase to one 0.5 mg tablet twice daily for 4 days, then increase to one 1 mg tablet twice daily. 53 tablet 0   No current facility-administered medications for this visit.    Allergies: Nicotine polacrilex, Azithromycin, Cleocin [clindamycin hcl], and Oxycodone  Past Medical History:  Diagnosis Date  . Allergy   . Anxiety   . Asthma   . Bipolar 1 disorder ([place])   . Depression   . GERD (gastroesophageal reflux disease)     Past Surgical History:  Procedure Laterality Date  . CHOLECYSTECTOMY    . CYST REMOVAL HAND  1997  . FRACTURE SURGERY    . GALLBLADDER SURGERY    . SPINE SURGERY      Family History  Problem Relation Age of Onset  . Hypertension Mother   . Mental illness Mother   . Diabetes Father   . Hyperlipidemia Father   . Depression Sister   . Diabetes Sister   . Hyperlipidemia Sister   . Cancer Maternal Grandmother   . Mental illness Maternal Grandmother     Social  History   Tobacco Use  . Smoking status: Current Every Day Smoker    Packs/day: 1.00    Years: 30.00    Pack years: 30.00    Types: Cigarettes  . Smokeless tobacco: Never Used  . Tobacco comment: 5cigs a day as of 05/21/17 ep  Substance Use Topics  . Alcohol use: Yes    Alcohol/week: 21.0 standard drinks    Types: 21 Cans of beer per week    Subjective:     I connected with Jeff Norton on 12/28/19 at 11:40 AM EDT by a video enabled telemedicine application and verified that I am speaking with the correct person using two identifiers.   I discussed the limitations of evaluation and management by telemedicine and  the availability of in person appointments. The patient expressed understanding and agreed to proceed. Provider in office/ patient is at home; provider and patient are only 2 people on video call.   Patient notes he ran a fever of 103 on Sunday/ Monday nights; temperature last night was 102; denies any chest pain or shortness of breath; has been having night sweats; using his Trelegy daily but not having to use his rescue inhaler; + body aches/ chills and urinary frequency; no tick bites or recent travel; decreased appetite; normal sense of smell and taste and no changes in bowel habits; is fully vaccinated against COVID and had a negative COVID test done yesterday;    Objective:  There were no vitals filed for this visit.  General: Well developed, well nourished, in no acute distress  Skin : Warm and dry.  Head: Normocephalic and atraumatic  Lungs: Respirations unlabored; Neurologic: Alert and oriented; speech intact; face symmetrical; moves all extremities well; CNII-XII intact without focal deficit   Assessment:  1. Fever, unspecified fever cause   2. Urinary frequency   3. Pneumonia due to infectious organism, unspecified laterality, unspecified part of lung   4. Elevated LFTs     Plan:  Patient has had recent COVID test; with high fevers, need to rule out sepsis; pneumonia or urinary source needs to be considered as well;  Will get STAT labs and CXR and determine final treatment plan with patient;  Xray is concerning for pneumonia and will go ahead and start antibiotics; he will plan to see his PCP in office in 2 weeks to re-check labs; follow-up sooner prn.   No follow-ups on file.  Orders Placed This Encounter  Procedures  . Urine Culture    Standing Status:   Future    Number of Occurrences:   1    Standing Expiration Date:   12/27/2020  . DG Chest 2 View    Standing Status:   Future    Number of Occurrences:   1    Standing Expiration Date:   12/27/2020    Order Specific  Question:   Reason for Exam (SYMPTOM  OR DIAGNOSIS REQUIRED)    Answer:   fever/ negative COVID test/ fully vaccinated    Order Specific Question:   Preferred imaging location?    Answer:   [facility]    Order Specific Question:   Radiology Contrast Protocol - do NOT remove file path    Answer:   [email or web address]  . CBC with Differential/Platelet    Standing Status:   Future    Number of Occurrences:   1    Standing Expiration Date:   12/27/2020  . Comp Met (CMET)    Standing Status:   Future  Number of Occurrences:   1    Standing Expiration Date:   12/27/2020  . PSA    Standing Status:   Future    Number of Occurrences:   1    Standing Expiration Date:   12/27/2020  . Urinalysis    Standing Status:   Future    Number of Occurrences:   1    Standing Expiration Date:   12/27/2020    Requested Prescriptions    No prescriptions requested or ordered in this encounter

## 2019-12-28 NOTE — Progress Notes (Signed)
Reviewed labs and CXR with patient; will treat for pneumonia and he will return for in office visit in 2 weeks.

## 2019-12-29 LAB — URINE CULTURE
MICRO NUMBER:: 10704049
Result:: NO GROWTH
SPECIMEN QUALITY:: ADEQUATE

## 2020-01-10 ENCOUNTER — Other Ambulatory Visit: Payer: Self-pay | Admitting: Internal Medicine

## 2020-01-10 DIAGNOSIS — M4802 Spinal stenosis, cervical region: Secondary | ICD-10-CM

## 2020-01-11 ENCOUNTER — Ambulatory Visit (INDEPENDENT_AMBULATORY_CARE_PROVIDER_SITE_OTHER): Payer: Medicare Other | Admitting: Internal Medicine

## 2020-01-11 ENCOUNTER — Other Ambulatory Visit: Payer: Self-pay

## 2020-01-11 ENCOUNTER — Encounter: Payer: Self-pay | Admitting: Internal Medicine

## 2020-01-11 VITALS — BP 110/68 | HR 80 | Temp 98.3°F | Resp 16 | Ht 69.0 in | Wt 205.0 lb

## 2020-01-11 DIAGNOSIS — R7989 Other specified abnormal findings of blood chemistry: Secondary | ICD-10-CM | POA: Insufficient documentation

## 2020-01-11 DIAGNOSIS — R739 Hyperglycemia, unspecified: Secondary | ICD-10-CM | POA: Diagnosis not present

## 2020-01-11 DIAGNOSIS — M4802 Spinal stenosis, cervical region: Secondary | ICD-10-CM

## 2020-01-11 DIAGNOSIS — M19071 Primary osteoarthritis, right ankle and foot: Secondary | ICD-10-CM | POA: Diagnosis not present

## 2020-01-11 DIAGNOSIS — J189 Pneumonia, unspecified organism: Secondary | ICD-10-CM | POA: Insufficient documentation

## 2020-01-11 MED ORDER — CYCLOBENZAPRINE HCL 10 MG PO TABS: 10.0000 mg | ORAL_TABLET | Freq: Three times a day (TID) | ORAL | 2 refills | Status: DC | PRN

## 2020-01-11 NOTE — Progress Notes (Signed)
Subjective:  Patient ID: Jeff Norton, male    DOB: May 03, 1966  Age: 54 y.o. MRN: [id number]  CC: Cough  This visit occurred during the SARS-CoV-2 public health emergency.  Safety protocols were in place, including screening questions prior to the visit, additional usage of staff PPE, and extensive cleaning of exam room while observing appropriate contact time as indicated for disinfecting solutions.    HPI IMANI SHERRIN presents for f/up - He was seen elsewhere about 2 weeks ago at which time he had fever, cough, chills, and shortness of breath.  He was diagnosed and treated for pneumonia.  He tells me all of the symptoms have resolved.  He was also found to have an elevated blood sugar and elevated liver enzymes.  Outpatient Medications Prior to Visit  Medication Sig Dispense Refill  . diclofenac (VOLTAREN) 75 MG EC tablet Take 1 tablet (75 mg total) by mouth 2 (two) times daily. 180 tablet 1  . Fluticasone-Umeclidin-Vilant (TRELEGY ELLIPTA) 100-62.5-25 MCG/INH AEPB Inhale 1 puff into the lungs daily. 120 each 3  . lamoTRIgine (LAMICTAL) 200 MG tablet Take 1 tablet (200 mg total) by mouth 2 (two) times daily. 180 tablet 1  . LORazepam (ATIVAN) 1 MG tablet TAKE 1 TABLET(1 MG) BY MOUTH TWICE DAILY AS NEEDED FOR ANXIETY 60 tablet 5  . modafinil (PROVIGIL) 200 MG tablet TAKE ONE TABLET BY MOUTH ONE TIME DAILY  30 tablet 4  . montelukast (SINGULAIR) 10 MG tablet TAKE 1 TABLET BY MOUTH AT  BEDTIME 90 tablet 1  . PROAIR RESPICLICK 149 (90 Base) MCG/ACT AEPB INHALE 2 PUFFS INTO THE LUNGS FOUR TIMES DAILY AS NEEDED 1 each 5  . risperiDONE (RISPERDAL) 3 MG tablet     . rosuvastatin (CRESTOR) 10 MG tablet TAKE 1 TABLET BY MOUTH  DAILY 90 tablet 1  . varenicline (CHANTIX CONTINUING MONTH PAK) 1 MG tablet Take 1 tablet (1 mg total) by mouth 2 (two) times daily. 60 tablet 3  . varenicline (CHANTIX STARTING MONTH PAK) 0.5 MG X 11 & 1 MG X 42 tablet Take one 0.5 mg tablet by mouth once daily for 3  days, then increase to one 0.5 mg tablet twice daily for 4 days, then increase to one 1 mg tablet twice daily. 53 tablet 0  . doxycycline (VIBRA-TABS) 100 MG tablet Take 1 tablet (100 mg total) by mouth 2 (two) times daily. 20 tablet 0   No facility-administered medications prior to visit.    ROS Review of Systems  Constitutional: Negative.  Negative for chills, diaphoresis, fatigue and fever.  HENT: Negative.  Negative for trouble swallowing.   Eyes: Negative for visual disturbance.  Respiratory: Negative for cough, chest tightness, shortness of breath and wheezing.   Cardiovascular: Negative for chest pain, palpitations and leg swelling.  Gastrointestinal: Negative for abdominal pain, constipation, diarrhea, nausea and vomiting.  Endocrine: Negative.  Negative for polydipsia, polyphagia and polyuria.  Genitourinary: Negative.  Negative for difficulty urinating.  Musculoskeletal: Positive for arthralgias and neck pain. Negative for back pain, myalgias and neck stiffness.  Skin: Negative.  Negative for color change and pallor.  Neurological: Negative for dizziness, weakness and light-headedness.  Hematological: Negative for adenopathy. Does not bruise/bleed easily.  Psychiatric/Behavioral: Negative.     Objective:  BP 110/68 (BP Location: Left Arm, Patient Position: Sitting, Cuff Size: Large)   Pulse 80   Temp 98.3 F (36.8 C) (Oral)   Resp 16   Ht 5\' 9"  (1.753 m)   Wt (!) 205  lb (93 kg)   SpO2 96%   BMI 30.27 kg/m   BP Readings from Last 3 Encounters:  01/11/20 110/68  10/19/19 110/64  12/02/18 128/74    Wt Readings from Last 3 Encounters:  01/11/20 (!) 205 lb (93 kg)  10/19/19 219 lb (99.3 kg)  12/13/18 226 lb (102.5 kg)    Physical Exam Vitals reviewed.  Constitutional:      Appearance: Normal appearance.  HENT:     Nose: Nose normal.     Mouth/Throat:     Mouth: Mucous membranes are moist.  Eyes:     General: No scleral icterus.    Conjunctiva/sclera:  Conjunctivae normal.  Cardiovascular:     Rate and Rhythm: Normal rate and regular rhythm.     Heart sounds: No murmur heard.   Pulmonary:     Effort: Pulmonary effort is normal. No tachypnea or accessory muscle usage.     Breath sounds: No stridor or decreased air movement. Examination of the right-upper field reveals rhonchi. Rhonchi present. No decreased breath sounds, wheezing or rales.  Abdominal:     General: Abdomen is flat.     Palpations: There is no mass.     Tenderness: There is no abdominal tenderness. There is no guarding.  Musculoskeletal:        General: Normal range of motion.     Cervical back: Neck supple.     Right lower leg: No edema.     Left lower leg: No edema.  Lymphadenopathy:     Cervical: No cervical adenopathy.  Skin:    General: Skin is warm and dry.  Neurological:     General: No focal deficit present.     Mental Status: He is alert.     Lab Results  Component Value Date   WBC 5.9 12/28/2019   HGB 14.6 12/28/2019   HCT 41.1 12/28/2019   PLT 97.0 (L) 12/28/2019   GLUCOSE 103 (H) 01/11/2020   CHOL 112 10/19/2019   TRIG 186.0 (H) 10/19/2019   HDL 38.30 (L) 10/19/2019   LDLDIRECT 59.0 12/02/2018   LDLCALC 37 10/19/2019   ALT 31 01/11/2020   AST 23 01/11/2020   NA 136 01/11/2020   K 4.0 01/11/2020   CL 101 01/11/2020   CREATININE 0.74 01/11/2020   BUN 16 01/11/2020   CO2 26 01/11/2020   TSH 1.61 12/02/2018   PSA 0.92 12/28/2019   INR 1.0 01/11/2020   HGBA1C 5.7 (H) 01/11/2020   MICROALBUR 5.7 (H) 12/02/2018    DG Chest 2 View  Result Date: 12/28/2019 CLINICAL DATA:  Fever over the last 4 days. EXAM: CHEST - 2 VIEW COMPARISON:  11/07/2015 FINDINGS: Heart size is normal. Mediastinal shadows are normal. Lungs are clear except for questionable mild patchy infiltrate at the posterior lung bases visible on the lateral view. This region appeared normal in 2017. No dense consolidation or effusion. No significant bone finding. IMPRESSION:  Question mild patchy infiltrate in the posterior lung bases. That area looked clear on the lateral view of 11/07/2015. Electronically Signed   By: Nelson Chimes M.D.   On: 12/28/2019 13:58   DG Chest 2 View  Result Date: 01/12/2020 CLINICAL DATA:  Follow-up pneumonia EXAM: CHEST - 2 VIEW COMPARISON:  12/28/2019, CT chest 10/17/2019 FINDINGS: The heart size and mediastinal contours are within normal limits. Both lungs are clear. The visualized skeletal structures are unremarkable. IMPRESSION: No active cardiopulmonary disease. Electronically Signed   By: Donavan Foil M.D.   On: 01/12/2020 23:11  Assessment & Plan:   Dorien was seen today for cough.  Diagnoses and all orders for this visit:  Pneumonia of both lower lobes due to infectious organism- Based on his symptoms, exam, and chest x-ray this has resolved. -     DG Chest 2 View; Future  Acute hyperglycemia- He is prediabetic.  Medical therapy is not indicated. -     Hemoglobin A1c; Future -     BASIC METABOLIC PANEL WITH GFR; Future -     BASIC METABOLIC PANEL WITH GFR -     Hemoglobin A1c  Elevated LFTs- His liver enzymes are much better now.  I suspect that this was related to alcohol.  Screening for viral hepatitis is negative.  I recommended that he get vaccinated against hepatitis A and B. -     Hepatic function panel; Future -     Hepatitis A antibody, total; Future -     Hepatitis B core antibody, total; Future -     Hepatitis B surface antibody,qualitative; Future -     Hepatitis C antibody; Future -     Protime-INR; Future -     Protime-INR -     Hepatitis C antibody -     Hepatitis B surface antibody,qualitative -     Hepatitis B core antibody, total -     Hepatitis A antibody, total -     Hepatic function panel  Osteoarthritis of right ankle and foot -     cyclobenzaprine (FLEXERIL) 10 MG tablet; Take 1 tablet (10 mg total) by mouth 3 (three) times daily as needed for muscle spasms.  Spinal stenosis in cervical  region -     cyclobenzaprine (FLEXERIL) 10 MG tablet; Take 1 tablet (10 mg total) by mouth 3 (three) times daily as needed for muscle spasms.   I have discontinued Haydyn Girvan. Minton's doxycycline. I have also changed his cyclobenzaprine. Additionally, I am having him maintain his ProAir RespiClick, modafinil, risperiDONE, montelukast, diclofenac, rosuvastatin, LORazepam, lamoTRIgine, varenicline, Chantix Starting Month Pak, and Trelegy Ellipta.  Meds ordered this encounter  Medications  . cyclobenzaprine (FLEXERIL) 10 MG tablet    Sig: Take 1 tablet (10 mg total) by mouth 3 (three) times daily as needed for muscle spasms.    Dispense:  45 tablet    Refill:  2   I spent 50 minutes in preparing to see the patient by review of recent labs, imaging and procedures, obtaining and reviewing separately obtained history, communicating with the patient and family or caregiver, ordering medications, tests or procedures, and documenting clinical information in the EHR including the differential Dx, treatment, and any further evaluation and other management of 1. Pneumonia of both lower lobes due to infectious organism 2. Acute hyperglycemia 3. Elevated LFTs 4. Osteoarthritis of right ankle and foot 5. Spinal stenosis in cervical region     Follow-up: Return in about 4 weeks (around 02/08/2020).  Scarlette Calico, MD

## 2020-01-11 NOTE — Patient Instructions (Signed)
Community-Acquired Pneumonia, Adult Pneumonia is an infection of the lungs. It causes swelling in the airways of the lungs. Mucus and fluid may also build up inside the airways. One type of pneumonia can happen while a person is in a hospital. A different type can happen when a person is not in a hospital (community-acquired pneumonia).  What are the causes?  This condition is caused by germs (viruses, bacteria, or fungi). Some types of germs can be passed from one person to another. This can happen when you breathe in droplets from the cough or sneeze of an infected person. What increases the risk? You are more likely to develop this condition if you:  Have a long-term (chronic) disease, such as: ? Chronic obstructive pulmonary disease (COPD). ? Asthma. ? Cystic fibrosis. ? Congestive heart failure. ? Diabetes. ? Kidney disease.  Have HIV.  Have sickle cell disease.  Have had your spleen removed.  Do not take good care of your teeth and mouth (poor dental hygiene).  Have a medical condition that increases the risk of breathing in droplets from your own mouth and nose.  Have a weakened body defense system (immune system).  Are a smoker.  Travel to areas where the germs that cause this illness are common.  Are around certain animals or the places they live. What are the signs or symptoms?  A dry cough.  A wet (productive) cough.  Fever.  Sweating.  Chest pain. This often happens when breathing deeply or coughing.  Fast breathing or trouble breathing.  Shortness of breath.  Shaking chills.  Feeling tired (fatigue).  Muscle aches. How is this treated? Treatment for this condition depends on many things. Most adults can be treated at home. In some cases, treatment must happen in a hospital. Treatment may include:  Medicines given by mouth or through an IV tube.  Being given extra oxygen.  Respiratory therapy. In rare cases, treatment for very bad pneumonia  may include:  Using a machine to help you breathe.  Having a procedure to remove fluid from around your lungs. Follow these instructions at home: Medicines  Take over-the-counter and prescription medicines only as told by your doctor. ? Only take cough medicine if you are losing sleep.  If you were prescribed an antibiotic medicine, take it as told by your doctor. Do not stop taking the antibiotic even if you start to feel better. General instructions   Sleep with your head and neck raised (elevated). You can do this by sleeping in a recliner or by putting a few pillows under your head.  Rest as needed. Get at least 8 hours of sleep each night.  Drink enough water to keep your pee (urine) pale yellow.  Eat a healthy diet that includes plenty of vegetables, fruits, whole grains, low-fat dairy products, and lean protein.  Do not use any products that contain nicotine or tobacco. These include cigarettes, e-cigarettes, and chewing tobacco. If you need help quitting, ask your doctor.  Keep all follow-up visits as told by your doctor. This is important. How is this prevented? A shot (vaccine) can help prevent pneumonia. Shots are often suggested for:  People older than 55 years of age.  People older than 55 years of age who: ? Are having cancer treatment. ? Have long-term (chronic) lung disease. ? Have problems with their body's defense system. You may also prevent pneumonia if you take these actions:  Get the flu (influenza) shot every year.  Go to the dentist as   often as told.  Wash your hands often. If you cannot use soap and water, use hand sanitizer. Contact a doctor if:  You have a fever.  You lose sleep because your cough medicine does not help. Get help right away if:  You are short of breath and it gets worse.  You have more chest pain.  Your sickness gets worse. This is very serious if: ? You are an older adult. ? Your body's defense system is weak.  You  cough up blood. Summary  Pneumonia is an infection of the lungs.  Most adults can be treated at home. Some will need treatment in a hospital.  Drink enough water to keep your pee pale yellow.  Get at least 8 hours of sleep each night. This information is not intended to replace advice given to you by your health care provider. Make sure you discuss any questions you have with your health care provider. Document Revised: 09/22/2018 Document Reviewed: 01/28/2018 [employer] Patient Education  2020 [employer]  

## 2020-01-12 ENCOUNTER — Ambulatory Visit (INDEPENDENT_AMBULATORY_CARE_PROVIDER_SITE_OTHER)
Admission: RE | Admit: 2020-01-12 | Discharge: 2020-01-12 | Disposition: A | Discharge status: Home / Self Care | Payer: Medicare Other | Source: Ambulatory Visit | Attending: Internal Medicine | Admitting: Internal Medicine

## 2020-01-12 DIAGNOSIS — J189 Pneumonia, unspecified organism: Secondary | ICD-10-CM

## 2020-01-12 LAB — HEPATIC FUNCTION PANEL
AG Ratio: 1.7 (calc) (ref 1.0–2.5)
ALT: 31 U/L (ref 9–46)
AST: 23 U/L (ref 10–35)
Albumin: 4.7 g/dL (ref 3.6–5.1)
Alkaline phosphatase (APISO): 84 U/L (ref 35–144)
Bilirubin, Direct: 0.3 mg/dL — ABNORMAL HIGH (ref 0.0–0.2)
Globulin: 2.8 g/dL (calc) (ref 1.9–3.7)
Indirect Bilirubin: 0.8 mg/dL (calc) (ref 0.2–1.2)
Total Bilirubin: 1.1 mg/dL (ref 0.2–1.2)
Total Protein: 7.5 g/dL (ref 6.1–8.1)

## 2020-01-12 LAB — BASIC METABOLIC PANEL WITH GFR
BUN: 16 mg/dL (ref 7–25)
CO2: 26 mmol/L (ref 20–32)
Calcium: 10.2 mg/dL (ref 8.6–10.3)
Chloride: 101 mmol/L (ref 98–110)
Creat: 0.74 mg/dL (ref 0.70–1.33)
GFR, Est African American: 120 mL/min/{1.73_m2} (ref >=60)
GFR, Est Non African American: 104 mL/min/{1.73_m2} (ref >=60)
Glucose, Bld: 103 mg/dL — ABNORMAL HIGH (ref 65–99)
Potassium: 4 mmol/L (ref 3.5–5.3)
Sodium: 136 mmol/L (ref 135–146)

## 2020-01-12 LAB — HEPATITIS B SURFACE ANTIBODY,QUALITATIVE: Hep B S Ab: NONREACTIVE

## 2020-01-12 LAB — PROTIME-INR
INR: 1
Prothrombin Time: 11.1 s (ref 9.0–11.5)

## 2020-01-12 LAB — HEPATITIS C ANTIBODY
Hepatitis C Ab: NONREACTIVE
SIGNAL TO CUT-OFF: 0.01 (ref <1.00)

## 2020-01-12 LAB — HEMOGLOBIN A1C
Hgb A1c MFr Bld: 5.7 % of total Hgb — ABNORMAL HIGH (ref <5.7)
Mean Plasma Glucose: 117 (calc)
eAG (mmol/L): 6.5 (calc)

## 2020-01-12 LAB — HEPATITIS A ANTIBODY, TOTAL: Hepatitis A AB,Total: NONREACTIVE

## 2020-01-12 LAB — HEPATITIS B CORE ANTIBODY, TOTAL: Hep B Core Total Ab: NONREACTIVE

## 2020-01-13 ENCOUNTER — Encounter: Payer: Self-pay | Admitting: Internal Medicine

## 2020-01-17 ENCOUNTER — Telehealth: Payer: Medicare Other

## 2020-01-24 DIAGNOSIS — E119 Type 2 diabetes mellitus without complications: Secondary | ICD-10-CM | POA: Diagnosis not present

## 2020-01-24 DIAGNOSIS — H2513 Age-related nuclear cataract, bilateral: Secondary | ICD-10-CM | POA: Diagnosis not present

## 2020-01-24 DIAGNOSIS — H524 Presbyopia: Secondary | ICD-10-CM | POA: Diagnosis not present

## 2020-01-24 LAB — HM DIABETES EYE EXAM

## 2020-01-27 ENCOUNTER — Other Ambulatory Visit: Payer: Self-pay | Admitting: Internal Medicine

## 2020-01-27 ENCOUNTER — Ambulatory Visit: Payer: Medicare Other | Admitting: Pharmacist

## 2020-01-27 ENCOUNTER — Other Ambulatory Visit: Payer: Self-pay

## 2020-01-27 DIAGNOSIS — E785 Hyperlipidemia, unspecified: Secondary | ICD-10-CM

## 2020-01-27 DIAGNOSIS — M19071 Primary osteoarthritis, right ankle and foot: Secondary | ICD-10-CM

## 2020-01-27 DIAGNOSIS — Z720 Tobacco use: Secondary | ICD-10-CM

## 2020-01-27 DIAGNOSIS — J449 Chronic obstructive pulmonary disease, unspecified: Secondary | ICD-10-CM

## 2020-01-27 NOTE — Chronic Care Management (AMB) (Signed)
Chronic Care Management Pharmacy  Name: Jeff Norton  MRN: [id number] DOB: 1966-05-07   Chief Complaint/ HPI  Jeff Norton,  54 y.o. , male presents for their Follow-Up CCM visit with the clinical pharmacist via telephone due to COVID-19 Pandemic.  PCP : [name], MD Patient Care Team: [name], MD as PCP - General (Internal Medicine) [name], [facility] as Pharmacist (Pharmacist)  Their chronic conditions include: Hyperlipidemia, Diabetes, COPD, Asthma, Anxiety, Osteoarthritis, Tobacco use, BPH and Bipolar Disorder   Patient works as a Scientist, clinical (histocompatibility and immunogenetics) at [employer].  Office Visits: 01/11/20 Dr [name] OV: PNA f/u - resolved. Elevated LFT f/u - improved, suspect related to alcohol. Rec'd Hep A and B vaccines.  10/19/19: Patient presented to Dr. [name] for follow-up. Patient with continued arthralgias and neck pain, otherwise stable. No medication changes made.  Consult Visit: 12/12/19: referral to GI for colonoscopy placed.  10/17/19: Patient presented to [name], FNP for tobacco cessation counseling.  07/08/19: Patient presented to Dr. [name] (Dermatology)  Allergies  Allergen Reactions  . Nicotine Polacrilex Rash    Nicotine patch causes rash  . Azithromycin   . Cleocin [Clindamycin Hcl]   . Oxycodone Nausea And Vomiting    Medications: Outpatient Encounter Medications as of 01/27/2020  Medication Sig  . cyclobenzaprine (FLEXERIL) 10 MG tablet Take 1 tablet (10 mg total) by mouth 3 (three) times daily as needed for muscle spasms.  . diclofenac (VOLTAREN) 75 MG EC tablet Take 1 tablet (75 mg total) by mouth 2 (two) times daily.  . Fluticasone-Umeclidin-Vilant (TRELEGY ELLIPTA) 100-62.5-25 MCG/INH AEPB Inhale 1 puff into the lungs daily.  Marland Kitchen lamoTRIgine (LAMICTAL) 200 MG tablet Take 1 tablet (200 mg total) by mouth 2 (two) times daily.  Marland Kitchen LORazepam (ATIVAN) 1 MG tablet TAKE 1 TABLET(1 MG) BY MOUTH TWICE DAILY AS NEEDED FOR ANXIETY  . modafinil  (PROVIGIL) 200 MG tablet TAKE ONE TABLET BY MOUTH ONE TIME DAILY   . montelukast (SINGULAIR) 10 MG tablet TAKE 1 TABLET BY MOUTH AT  BEDTIME  . PROAIR RESPICLICK 381 (90 Base) MCG/ACT AEPB INHALE 2 PUFFS INTO THE LUNGS FOUR TIMES DAILY AS NEEDED  . risperiDONE (RISPERDAL) 3 MG tablet   . rosuvastatin (CRESTOR) 10 MG tablet TAKE 1 TABLET BY MOUTH  DAILY  . varenicline (CHANTIX CONTINUING MONTH PAK) 1 MG tablet Take 1 tablet (1 mg total) by mouth 2 (two) times daily.  . varenicline (CHANTIX STARTING MONTH PAK) 0.5 MG X 11 & 1 MG X 42 tablet Take one 0.5 mg tablet by mouth once daily for 3 days, then increase to one 0.5 mg tablet twice daily for 4 days, then increase to one 1 mg tablet twice daily.  . [DISCONTINUED] diclofenac (VOLTAREN) 75 MG EC tablet TAKE 1 TABLET BY MOUTH  TWICE DAILY  . [DISCONTINUED] rosuvastatin (CRESTOR) 10 MG tablet TAKE 1 TABLET BY MOUTH  DAILY   No facility-administered encounter medications on file as of 01/27/2020.   Current Diagnosis/Assessment:   Goals Addressed            This Visit's Progress   . Pharmacy Care Plan   On track    CARE PLAN ENTRY (see longitudinal plan of care for additional care plan information)  Current Barriers:  . Chronic Disease Management support, education, and care coordination needs related to Hyperlipidemia, COPD, Osteoarthritis, and Tobacco use  Hyperlipidemia Lab Results  Component Value Date/Time   LDLCALC 37 10/19/2019 04:23 PM   TRIG 186.0 (H)  10/19/2019 04:23 PM .  Pharmacist Clinical Goal(s): o Over the next 30 days, patient will work with PharmD and providers to maintain LDL goal < 100 . Current regimen:  o Rosuvastatin 10 mg daily o OTC fish oil 2000 mg daily . Interventions: o Discussed cholesterol goals and benefits of medication for prevention of heart attack / stroke . Patient self care activities - Over the next 30 days, patient will: o Continue medication as prescribed o Continue low  cholesterol/triglyceride diet  COPD . Pharmacist Clinical Goal(s) o Over the next 30 days, patient will work with PharmD and providers to optimize therapy and reduce costs . Current regimen:  o Proair Respiclick 269 mcg/act 2 puff  every 4-6 hrs as needed  o Trelegy Ellipta 1 puff daily o Montelukast 10 mg at bedtime . Interventions: o Pursue patient assistance for Trelegy through [place] . Patient self care activities - Over the next 30 days, patient will: o Bring income and prescription cost documentation to office and sign Trelegy application  Tobacco Use . Pharmacist Clinical Goal(s) o Over the next 30 days, patient will work with PharmD and providers to quit smoking . Current regimen:  o No medications . Interventions: o Discussed benefits of Chantix and relatively low risk of psychiatric side effects . Patient self care activities - Over the next 30 days, patient will: o Continue Chantix - Continuing month box o Have last cigarette during first month of taking Chantix  Osteoarthritis . Pharmacist Clinical Goal(s) o Over the next 30 days, patient will work with PharmD and providers to optimize therapy . Current regimen:  o Diclofenac 75 mg twice a day . Interventions: o Discussed safety issues with long term use, including GI bleeding/ulcers; this risk can be mitigated by taking with food . Patient self care activities - Over the next 30 days, patient will: o Take diclofenac with food  Medication management . Pharmacist Clinical Goal(s): o Over the next 30 days, patient will work with PharmD and providers to maintain optimal medication adherence . Current pharmacy: [facility] order . Interventions o Comprehensive medication review performed. o Continue current medication management strategy . Patient self care activities - Over the next 30 days, patient will: o Focus on medication adherence by pill box o Take medications as prescribed o Report any questions  or concerns to PharmD and/or provider(s)  Please see past updates related to this goal by clicking on the "Past Updates" button in the selected goal        COPD / Asthma    Last spirometry score: FEV1 57%; FEV1/FVC 0.61  Gold Grade: Gold 2 (FEV1 50-79%) Current COPD Classification:  A (low sx, <2 exacerbations/yr)  Lab Results  Component Value Date/Time   EOSPCT 0.2 12/28/2019 12:41 PM   EOSABS 0.0 12/28/2019 12:41 PM   Patient has failed these meds in past: n/a Patient is currently controlled on the following medications:  . Proair Respiclick 485 mcg/act 2 puff QID PRN  . Trelegy Ellipta 1 puff daily (LF 08/28/19 30DS) - QOD . Montelukast 10 mg QHS  Using maintenance inhaler regularly? Yes Frequency of rescue inhaler use:  infrequently  We discussed:  Trelegy PAP is ready for patient to sign, he needs to gather income and prescription costs and bring to office. Pt denies issues with SOB.    Plan  Continue current medications    Tobacco Abuse   Tobacco Status:  Social History   Tobacco Use  Smoking Status Current Every Day Smoker  .  Packs/day: 1.00  . Years: 30.00  . Pack years: 30.00  . Types: Cigarettes  Smokeless Tobacco Never Used  Tobacco Comment   5cigs a day as of 05/21/17 ep   Pt has tried to quit several times in the past but has never been successful. reports MOTIVATION to quit is high reports CONFIDENCE in quitting is low  Patient has failed these meds in past: Chantix (only took for a few days, then stopped when a friend told him it may worsen depression), nicotine patch (allergies), lozenges Patient is currently uncontrolled on the following medications:  . No medications  We discussed:  Pt has been taking Chantix for 3 weeks, he is still smoking 15 cigarettes per day. Discussed it is best to stop smoking during the first month on Chantix, pt voiced understanding.  Plan  Continuing month box for 3-6 months total until smoking cessation is  maintained  Hyperlipidemia   LDL goal < 100 TG goal < 150  Lipid Panel     Component Value Date/Time   CHOL 112 10/19/2019 1623   TRIG 186.0 (H) 10/19/2019 1623   HDL 38.30 (L) 10/19/2019 1623   LDLCALC 37 10/19/2019 1623   LDLDIRECT 59.0 12/02/2018 1613    Hepatic Function Latest Ref Rng & Units 01/11/2020 12/28/2019 10/19/2019  Total Protein 6.1 - 8.1 g/dL 7.5 7.0 7.3  Albumin 3.5 - 5.2 g/dL - 4.4 4.7  AST 10 - 35 U/L 23 65(H) 29  ALT 9 - 46 U/L 31 81(H) 30  Alk Phosphatase 39 - 117 U/L - 97 68  Total Bilirubin 0.2 - 1.2 mg/dL 1.1 1.5(H) 0.9  Bilirubin, Direct 0.0 - 0.2 mg/dL 0.3(H) - 0.2    The ASCVD Risk score (Haring., et al., 2013) failed to calculate for the following reasons:   The valid total cholesterol range is 130 to 320 mg/dL   Patient has failed these meds in past: n/a Patient is currently controlled on the following medications:  . Rosuvastatin 10 mg daily . Fish Oil 2000 mg daily  We discussed:  diet and exercise extensively; benefits of statin for ASCVD prevention  Plan  Continue current medications and control with diet and exercise  Anxiety / Bipolar 1  Disorder   Depression screen [facility] 2/9 10/19/2019 12/02/2018 01/09/2017  Decreased Interest _0 Down, Depressed, Hopeless 0 0 1  PHQ - 2 Score _1 Altered sleeping 1 1 0  Tired, decreased energy 0 1 0  Change in appetite 0 0 0  Feeling bad or failure about yourself  0 0 0  Trouble concentrating 0 0 0  Moving slowly or fidgety/restless 0 0 0  Suicidal thoughts 0 0 0  PHQ-9 Score _2 Difficult doing work/chores Not difficult at all Not difficult at all Not difficult at all   GAD7 Score: No flowsheet data found.  Patient has failed these meds in past: Abilfy, carbamazepine, clonazepam,  Patient is currently controlled on the following medications:  . Lamotrigine 200 mg BID  . Lorazepam 1 mg BID PRN  . Modafinil 200 mg daily  . Risperidone 3 mg BID  We discussed:  Prescribed by [facility], pt reports Bipolar symptoms are well controlled on current meds.   Plan  Continue current medications   Osteoarthritis   Patient has failed these meds in past: n/a Patient is currently controlled on the following medications:  . Diclofenac 75 mg BID  We discussed:  Pt reports  arthritis pain is well controlled with diclofenac, "you can't pry these pills out of my cold dead hands". Discussed safety issues with long term NSAID use including GI bleed, HTN, and kidney damage. Pt agreed to take with food to prevent GI issues.   Plan  Continue current medications - take with food   Medication Management   [facility] for all medications. Uses pill box? Yes Pt endorses 100% compliance  We discussed: Pt receives maintenance meds through mail order pharmacy, he denies issues with managing meds this way.   Plan  Continue current medication management strategy    Follow up: 3 month phone visit  Charlene Brooke, PharmD Clinical Pharmacist [facility] at [facility] [phone]

## 2020-01-27 NOTE — Patient Instructions (Signed)
Visit Information  Phone number for Pharmacist: [phone]  Goals Addressed            This [place]   On track    [place] (see longitudinal plan of care for additional care plan information)  Current Barriers:   Chronic Disease Management support, education, and care coordination needs related to Hyperlipidemia, COPD, Osteoarthritis, and Tobacco use  Hyperlipidemia Lab Results  Component Value Date/Time   LDLCALC 37 10/19/2019 04:23 PM   TRIG 186.0 (H) 10/19/2019 04:23 PM   Pharmacist Clinical Goal(s): o Over the next 30 days, patient will work with PharmD and providers to maintain LDL goal < 100  Current regimen:  o Rosuvastatin 10 mg daily o OTC fish oil 2000 mg daily  Interventions: o Discussed cholesterol goals and benefits of medication for prevention of heart attack / stroke  Patient self care activities - Over the next 30 days, patient will: o Continue medication as prescribed o Continue low cholesterol/triglyceride diet  COPD  Pharmacist Clinical Goal(s) o Over the next 30 days, patient will work with PharmD and providers to optimize therapy and reduce costs  Current regimen:  o Proair Respiclick 245 mcg/act 2 puff  every 4-6 hrs as needed  o Trelegy Ellipta 1 puff daily o Montelukast 10 mg at bedtime  Interventions: o Pursue patient assistance for Trelegy through [place]  Patient self care activities - Over the next 30 days, patient will: o Bring income and prescription cost documentation to office and sign Trelegy application  Tobacco Use  Pharmacist Clinical Goal(s) o Over the next 30 days, patient will work with PharmD and providers to quit smoking  Current regimen:  o No medications  Interventions: o Discussed benefits of Chantix and relatively low risk of psychiatric side effects  Patient self care activities - Over the next 30 days, patient will: o Continue Chantix - Continuing month box o Have  last cigarette during first month of taking Chantix  Osteoarthritis  Pharmacist Clinical Goal(s) o Over the next 30 days, patient will work with PharmD and providers to optimize therapy  Current regimen:  o Diclofenac 75 mg twice a day  Interventions: o Discussed safety issues with long term use, including GI bleeding/ulcers; this risk can be mitigated by taking with food  Patient self care activities - Over the next 30 days, patient will: o Take diclofenac with food  Medication management  Pharmacist Clinical Goal(s): o Over the next 30 days, patient will work with PharmD and providers to maintain optimal medication adherence  [facility] pharmacy[facility]  Interventions o Comprehensive medication review performed. o Continue current medication management strategy  Patient self care activities - Over the next 30 days, patient will: o Focus on medication adherence by pill box o Take medications as prescribed o Report any questions or concerns to PharmD and/or provider(s)  Please see past updates related to this goal by clicking on the "Past Updates" button in the selected goal       Patient verbalizes understanding of instructions provided today.   Telephone follow up appointment with pharmacy team member scheduled for: 3 months  Charlene Brooke, PharmD Clinical Pharmacist [facility] at [facility] [phone]

## 2020-02-07 DIAGNOSIS — G4733 Obstructive sleep apnea (adult) (pediatric): Secondary | ICD-10-CM | POA: Diagnosis not present

## 2020-02-13 DIAGNOSIS — D485 Neoplasm of uncertain behavior of skin: Secondary | ICD-10-CM | POA: Diagnosis not present

## 2020-02-17 ENCOUNTER — Other Ambulatory Visit: Payer: Self-pay | Admitting: Internal Medicine

## 2020-02-17 DIAGNOSIS — J301 Allergic rhinitis due to pollen: Secondary | ICD-10-CM

## 2020-02-28 DIAGNOSIS — Z20822 Contact with and (suspected) exposure to covid-19: Secondary | ICD-10-CM | POA: Diagnosis not present

## 2020-03-11 ENCOUNTER — Other Ambulatory Visit: Payer: Self-pay | Admitting: Internal Medicine

## 2020-03-11 DIAGNOSIS — M4802 Spinal stenosis, cervical region: Secondary | ICD-10-CM

## 2020-03-11 DIAGNOSIS — M19071 Primary osteoarthritis, right ankle and foot: Secondary | ICD-10-CM

## 2020-03-12 ENCOUNTER — Encounter: Payer: Self-pay | Admitting: Internal Medicine

## 2020-03-27 ENCOUNTER — Encounter: Payer: Self-pay | Admitting: Internal Medicine

## 2020-04-17 ENCOUNTER — Other Ambulatory Visit: Payer: Self-pay | Admitting: Internal Medicine

## 2020-04-17 DIAGNOSIS — E785 Hyperlipidemia, unspecified: Secondary | ICD-10-CM

## 2020-05-03 ENCOUNTER — Telehealth: Payer: Medicare Other

## 2020-05-20 DIAGNOSIS — J4551 Severe persistent asthma with (acute) exacerbation: Secondary | ICD-10-CM | POA: Diagnosis not present

## 2020-05-21 ENCOUNTER — Other Ambulatory Visit: Payer: Self-pay | Admitting: Internal Medicine

## 2020-05-21 DIAGNOSIS — F411 Generalized anxiety disorder: Secondary | ICD-10-CM

## 2020-06-04 ENCOUNTER — Other Ambulatory Visit: Payer: Self-pay | Admitting: Internal Medicine

## 2020-06-04 DIAGNOSIS — M19071 Primary osteoarthritis, right ankle and foot: Secondary | ICD-10-CM

## 2020-06-04 DIAGNOSIS — M4802 Spinal stenosis, cervical region: Secondary | ICD-10-CM

## 2020-07-09 DIAGNOSIS — D1801 Hemangioma of skin and subcutaneous tissue: Secondary | ICD-10-CM | POA: Diagnosis not present

## 2020-07-09 DIAGNOSIS — Z85828 Personal history of other malignant neoplasm of skin: Secondary | ICD-10-CM | POA: Diagnosis not present

## 2020-07-09 DIAGNOSIS — L821 Other seborrheic keratosis: Secondary | ICD-10-CM | POA: Diagnosis not present

## 2020-07-09 DIAGNOSIS — D485 Neoplasm of uncertain behavior of skin: Secondary | ICD-10-CM | POA: Diagnosis not present

## 2020-07-09 DIAGNOSIS — L820 Inflamed seborrheic keratosis: Secondary | ICD-10-CM | POA: Diagnosis not present

## 2020-07-09 DIAGNOSIS — L570 Actinic keratosis: Secondary | ICD-10-CM | POA: Diagnosis not present

## 2020-07-09 DIAGNOSIS — D2361 Other benign neoplasm of skin of right upper limb, including shoulder: Secondary | ICD-10-CM | POA: Diagnosis not present

## 2020-07-09 DIAGNOSIS — D2262 Melanocytic nevi of left upper limb, including shoulder: Secondary | ICD-10-CM | POA: Diagnosis not present

## 2020-07-09 DIAGNOSIS — D225 Melanocytic nevi of trunk: Secondary | ICD-10-CM | POA: Diagnosis not present

## 2020-07-09 DIAGNOSIS — L814 Other melanin hyperpigmentation: Secondary | ICD-10-CM | POA: Diagnosis not present

## 2020-07-09 DIAGNOSIS — D2261 Melanocytic nevi of right upper limb, including shoulder: Secondary | ICD-10-CM | POA: Diagnosis not present

## 2020-07-15 ENCOUNTER — Other Ambulatory Visit: Payer: Self-pay | Admitting: Internal Medicine

## 2020-07-15 DIAGNOSIS — J301 Allergic rhinitis due to pollen: Secondary | ICD-10-CM

## 2020-07-20 ENCOUNTER — Other Ambulatory Visit: Payer: Self-pay | Admitting: Internal Medicine

## 2020-07-20 DIAGNOSIS — J449 Chronic obstructive pulmonary disease, unspecified: Secondary | ICD-10-CM

## 2020-07-23 DIAGNOSIS — G4733 Obstructive sleep apnea (adult) (pediatric): Secondary | ICD-10-CM | POA: Diagnosis not present

## 2020-07-24 ENCOUNTER — Telehealth: Payer: Self-pay | Admitting: Pharmacist

## 2020-07-24 NOTE — Progress Notes (Signed)
Chronic Care Management Pharmacy Assistant   Name: Jeff Norton  MRN: [id number] DOB: Jun 25, 1965  Reason for Encounter: Copd Adherence Call   PCP : Jeff Lima, MD  Allergies:   Allergies  Allergen Reactions   Nicotine Polacrilex Rash    Nicotine patch causes rash   Azithromycin    Cleocin [Clindamycin Hcl]    Oxycodone Nausea And Vomiting    Medications: Outpatient Encounter Medications as of 07/24/2020  Medication Sig   cyclobenzaprine (FLEXERIL) 10 MG tablet Take 1 tablet (10 mg total) by mouth 3 (three) times daily as needed for muscle spasms.   diclofenac (VOLTAREN) 75 MG EC tablet TAKE 1 TABLET BY MOUTH  TWICE DAILY   lamoTRIgine (LAMICTAL) 200 MG tablet Take 1 tablet (200 mg total) by mouth 2 (two) times daily.   LORazepam (ATIVAN) 1 MG tablet TAKE 1 TABLET(1 MG) BY MOUTH TWICE DAILY AS NEEDED FOR ANXIETY   modafinil (PROVIGIL) 200 MG tablet TAKE ONE TABLET BY MOUTH ONE TIME DAILY    montelukast (SINGULAIR) 10 MG tablet TAKE 1 TABLET BY MOUTH AT  BEDTIME   PROAIR RESPICLICK 510 (90 Base) MCG/ACT AEPB INHALE 2 PUFFS INTO THE LUNGS FOUR TIMES DAILY AS NEEDED   risperiDONE (RISPERDAL) 3 MG tablet    rosuvastatin (CRESTOR) 10 MG tablet TAKE 1 TABLET BY MOUTH  DAILY   TRELEGY ELLIPTA 100-62.5-25 MCG/INH AEPB INHALE 1 PUFF INTO THE LUNGS DAILY   varenicline (CHANTIX CONTINUING MONTH PAK) 1 MG tablet Take 1 tablet (1 mg total) by mouth 2 (two) times daily.   varenicline (CHANTIX STARTING MONTH PAK) 0.5 MG X 11 & 1 MG X 42 tablet Take one 0.5 mg tablet by mouth once daily for 3 days, then increase to one 0.5 mg tablet twice daily for 4 days, then increase to one 1 mg tablet twice daily.   No facility-administered encounter medications on file as of 07/24/2020.    Current Diagnosis: Patient Active Problem List   Diagnosis Date Noted   Pneumonia of both lower lobes due to infectious organism 01/11/2020   Acute hyperglycemia 01/11/2020   Elevated LFTs  01/11/2020   Continuous tobacco abuse 12/20/2019   GERD with apnea 10/19/2019   Abnormal electrocardiogram (ECG) (EKG) 12/02/2018   GAD (generalized anxiety disorder) 09/29/2018   Nocturia associated with benign prostatic hyperplasia 02/25/2018   Bipolar 1 disorder, depressed, full remission ([place]) 11/20/2016   Spinal stenosis in cervical region 09/18/2016   Sleep apnea with hypersomnolence 08/19/2016   Bile salt-induced diarrhea 01/02/2016   Chronic pansinusitis 01/01/2016   COPD with asthma ([place]) 11/07/2015   Type II diabetes mellitus with manifestations ([place]) 11/07/2015   Routine general medical examination at a health care facility 11/07/2015   Obesity (BMI 30.0-34.9) 02/24/2012   Smokers' cough ([place]) 02/24/2012   Osteoarthritis of ankle and foot 10/08/2007   Hyperlipidemia LDL goal <100 04/16/2007   Allergic rhinitis due to pollen 04/16/2007   Asthma, mild intermittent, poorly controlled 04/16/2007   OSA on CPAP 04/16/2007    Goals Addressed   None     Follow-Up:  Pharmacist Review   Current COPD regimen: The patients current regime is Proair, Trelegy and singulair   No flowsheet data found.  Any recent hospitalizations or ED visits since last visit with CPP? The patient stated that he has not been to the hospital or the ED recently   Reports no problems with cough, mucus or tightness in chest denies COPD symptoms   What recent interventions/DTPs have  been made by any provider to improve breathing since last visit: Patient states that he is continuing to take his trelegy in the morning but not so much the proair   Have you had exacerbation/flare-up since last visit? The patient states that he has not had any flare ups since he last spoke with Jeff Norton    What do you do when you are short of breath? The patient states that when he gets sob he will sometimes just go and have a seat until he can catch his breath and if that does not work he will use  his inhaler  Respiratory Devices/Equipment  Do you have a nebulizer? Yes  Do you use a Peak Flow Meter? No  Do you use a maintenance inhaler? Yes  How often do you forget to use your daily inhaler? The patient states he only uses when needed  Do you use a rescue inhaler? No  How often do you use your rescue inhaler? Patient states not sure which one is a rescue inhaler  Do you use a spacer with your inhaler? Patient is not sure   Adherence Review:  Does the patient have >5 day gap between last estimated fill date for maintenance inhaler medications? No    Jeff Norton, [place] [phone]

## 2020-08-13 ENCOUNTER — Telehealth: Payer: Self-pay | Admitting: Pharmacist

## 2020-08-13 NOTE — Progress Notes (Signed)
    Chronic Care Management Pharmacy Assistant   Name: Jeff Norton  MRN: [id number] DOB: Sep 27, 1965  Reason for Encounter: Chart Review   PCP : [name], MD  Allergies:   Allergies  Allergen Reactions  . Nicotine Polacrilex Rash    Nicotine patch causes rash  . Azithromycin   . Cleocin [Clindamycin Hcl]   . Oxycodone Nausea And Vomiting    Medications: Outpatient Encounter Medications as of 08/13/2020  Medication Sig  . cyclobenzaprine (FLEXERIL) 10 MG tablet Take 1 tablet (10 mg total) by mouth 3 (three) times daily as needed for muscle spasms.  . diclofenac (VOLTAREN) 75 MG EC tablet TAKE 1 TABLET BY MOUTH  TWICE DAILY  . lamoTRIgine (LAMICTAL) 200 MG tablet Take 1 tablet (200 mg total) by mouth 2 (two) times daily.  Marland Kitchen LORazepam (ATIVAN) 1 MG tablet TAKE 1 TABLET(1 MG) BY MOUTH TWICE DAILY AS NEEDED FOR ANXIETY  . modafinil (PROVIGIL) 200 MG tablet TAKE ONE TABLET BY MOUTH ONE TIME DAILY   . montelukast (SINGULAIR) 10 MG tablet TAKE 1 TABLET BY MOUTH AT  BEDTIME  . PROAIR RESPICLICK 976 (90 Base) MCG/ACT AEPB INHALE 2 PUFFS INTO THE LUNGS FOUR TIMES DAILY AS NEEDED  . risperiDONE (RISPERDAL) 3 MG tablet   . rosuvastatin (CRESTOR) 10 MG tablet TAKE 1 TABLET BY MOUTH  DAILY  . TRELEGY ELLIPTA 100-62.5-25 MCG/INH AEPB INHALE 1 PUFF INTO THE LUNGS DAILY  . varenicline (CHANTIX CONTINUING MONTH PAK) 1 MG tablet Take 1 tablet (1 mg total) by mouth 2 (two) times daily.  . varenicline (CHANTIX STARTING MONTH PAK) 0.5 MG X 11 & 1 MG X 42 tablet Take one 0.5 mg tablet by mouth once daily for 3 days, then increase to one 0.5 mg tablet twice daily for 4 days, then increase to one 1 mg tablet twice daily.   No facility-administered encounter medications on file as of 08/13/2020.    Current Diagnosis: Patient Active Problem List   Diagnosis Date Noted  . Pneumonia of both lower lobes due to infectious organism 01/11/2020  . Acute hyperglycemia 01/11/2020  . Elevated LFTs  01/11/2020  . Continuous tobacco abuse 12/20/2019  . GERD with apnea 10/19/2019  . Abnormal electrocardiogram (ECG) (EKG) 12/02/2018  . GAD (generalized anxiety disorder) 09/29/2018  . Nocturia associated with benign prostatic hyperplasia 02/25/2018  . Bipolar 1 disorder, depressed, full remission ([place]) 11/20/2016  . Spinal stenosis in cervical region 09/18/2016  . Sleep apnea with hypersomnolence 08/19/2016  . Bile salt-induced diarrhea 01/02/2016  . Chronic pansinusitis 01/01/2016  . COPD with asthma ([place]) 11/07/2015  . Type II diabetes mellitus with manifestations ([place]) 11/07/2015  . Routine general medical examination at a health care facility 11/07/2015  . Obesity (BMI 30.0-34.9) 02/24/2012  . Smokers' cough ([place]) 02/24/2012  . Osteoarthritis of ankle and foot 10/08/2007  . Hyperlipidemia LDL goal <100 04/16/2007  . Allergic rhinitis due to pollen 04/16/2007  . Asthma, mild intermittent, poorly controlled 04/16/2007  . OSA on CPAP 04/16/2007    Goals Addressed   None     Follow-Up:  Pharmacist Review   Reviewed chart for medication changes and adherence.  No ov, consults, or hospital visits since last care coordination call with clinical pharmacist No medication changes indicated  No gaps in adherence identified. Patient has follow up scheduled with pharmacy team. No further action required.   Wendy Poet, [place] pharmacy [phone]

## 2020-08-26 ENCOUNTER — Other Ambulatory Visit: Payer: Self-pay | Admitting: Internal Medicine

## 2020-08-26 DIAGNOSIS — M19071 Primary osteoarthritis, right ankle and foot: Secondary | ICD-10-CM

## 2020-08-26 DIAGNOSIS — M4802 Spinal stenosis, cervical region: Secondary | ICD-10-CM

## 2020-08-27 ENCOUNTER — Encounter: Payer: Self-pay | Admitting: Internal Medicine

## 2020-08-28 ENCOUNTER — Other Ambulatory Visit: Payer: Self-pay | Admitting: Internal Medicine

## 2020-08-28 DIAGNOSIS — M19071 Primary osteoarthritis, right ankle and foot: Secondary | ICD-10-CM

## 2020-08-28 DIAGNOSIS — M4802 Spinal stenosis, cervical region: Secondary | ICD-10-CM

## 2020-09-04 ENCOUNTER — Ambulatory Visit: Payer: Medicare Other | Admitting: Internal Medicine

## 2020-09-10 DIAGNOSIS — G4733 Obstructive sleep apnea (adult) (pediatric): Secondary | ICD-10-CM | POA: Diagnosis not present

## 2020-09-12 ENCOUNTER — Encounter: Payer: Self-pay | Admitting: Acute Care

## 2020-09-12 ENCOUNTER — Other Ambulatory Visit: Payer: Self-pay

## 2020-09-12 ENCOUNTER — Ambulatory Visit: Payer: Medicare Other | Admitting: Acute Care

## 2020-09-12 VITALS — BP 122/74 | HR 58 | Temp 97.5°F | Ht 69.0 in | Wt 220.0 lb

## 2020-09-12 DIAGNOSIS — Z9989 Dependence on other enabling machines and devices: Secondary | ICD-10-CM

## 2020-09-12 DIAGNOSIS — J449 Chronic obstructive pulmonary disease, unspecified: Secondary | ICD-10-CM | POA: Diagnosis not present

## 2020-09-12 DIAGNOSIS — F17210 Nicotine dependence, cigarettes, uncomplicated: Secondary | ICD-10-CM | POA: Diagnosis not present

## 2020-09-12 DIAGNOSIS — G4733 Obstructive sleep apnea (adult) (pediatric): Secondary | ICD-10-CM | POA: Diagnosis not present

## 2020-09-12 DIAGNOSIS — Z122 Encounter for screening for malignant neoplasm of respiratory organs: Secondary | ICD-10-CM

## 2020-09-12 NOTE — Progress Notes (Addendum)
History of Present Illness Jeff Norton is a 55 y.o. male current every day smoker with severe OSA & COPD/Asthma. He is followed by Dr. Elsworth Soho.  He has bipolar disorder and sees a Social worker at [facility].  He is followed by chronic care management He works at a [employer] store His  COPD/asthma symptoms, well controlled on Trelegy as maintenance. Rare need for rescue inhaler   09/12/2020  Pt. Presents for follow up. He was last seen in the office for a Shared Decision Making Visit 10/17/2019.First low dose Ct Chest was done the same day.  He was last seen in the office for an visit other than lung cancer screening 05/2017.He states he has been doing well on his CPAP machine. Down Load shows great compliance and benefit of therapy. No complaints of morning headache, no daytime sleepiness. He states he sleeps well with his CPAP.He is compliant with his Trelegy daily and has had a stable interval in regard to his COPD. He denies any shortness of breath,fever or Hemoptysis. Pt. Is due for his annual low dose CT in May 2022.   Test Results: Last Low Dose CT 10/17/2019 ( Followed by the screening program)  Lungs/Pleura: Centrilobular emphsyema noted. Multiple scattered bilateral calcified and noncalcified pulmonary nodules are identified measuring up to maximum volume derived equivalent diameter of 3.9 mm. No suspicious nodule or mass. No focal airspace consolidation. No pleural effusion.  Lung-RADS 2, benign appearance or behavior. Continue annual screening with low-dose chest CT without contrast in 12 months.  Emphysema (ICD10-J43.9) and Aortic Atherosclerosis (ICD10-170.0)  Down Load 08/13/2020-09/11/2020        CBC Latest Ref Rng & Units 12/28/2019 10/19/2019 12/02/2018  WBC 4.0 - 10.5 K/uL 5.9 8.7 9.9  Hemoglobin 13.0 - 17.0 g/dL 14.6 14.1 13.7  Hematocrit 39.0 - 52.0 % 41.1 40.3 40.2  Platelets 150.0 - 400.0 K/uL 97.0(L) 154.0 151.0    BMP Latest Ref Rng & Units 01/11/2020 12/28/2019  10/19/2019  Glucose 65 - 99 mg/dL 103(H) 182(H) 127(H)  BUN 7 - 25 mg/dL 16 12 22   Creatinine 0.70 - 1.33 mg/dL 0.74 0.75 0.87  BUN/Creat Ratio 6 - 22 (calc) NOT APPLICABLE - -  Sodium 785 - 146 mmol/L 136 131(L) 138  Potassium 3.5 - 5.3 mmol/L 4.0 4.0 4.1  Chloride 98 - 110 mmol/L 101 97 101  CO2 20 - 32 mmol/L 26 27 31   Calcium 8.6 - 10.3 mg/dL 10.2 9.1 9.9    BNP No results found for: BNP  ProBNP    Component Value Date/Time   PROBNP 40.0 12/02/2018 1613    PFT    Component Value Date/Time   FEV1PRE 2.02 12/13/2015 1005   FEV1POST 2.19 12/13/2015 1005   FVCPRE 3.41 12/13/2015 1005   FVCPOST 3.60 12/13/2015 1005   TLC 7.07 12/13/2015 1005   DLCOUNC 29.49 12/13/2015 1005   PREFEV1FVCRT 59 12/13/2015 1005   PSTFEV1FVCRT 61 12/13/2015 1005    No results found.   Past medical hx Past Medical History:  Diagnosis Date  . Allergy   . Anxiety   . Asthma   . Bipolar 1 disorder ([place])   . Depression   . GERD (gastroesophageal reflux disease)      Social History   Tobacco Use  . Smoking status: Current Every Day Smoker    Packs/day: 1.00    Years: 30.00    Pack years: 30.00    Types: Cigarettes  . Smokeless tobacco: Never Used  Vaping Use  . Vaping  Use: Every day  Substance Use Topics  . Alcohol use: Yes    Alcohol/week: 21.0 standard drinks    Types: 21 Cans of beer per week  . Drug use: No    [name] reports that he has been smoking cigarettes. He has a 30.00 pack-year smoking history. He has never used smokeless tobacco. He reports current alcohol use of about 21.0 standard drinks of alcohol per week. He reports that he does not use drugs.  Tobacco Cessation: Current every day smoker with a 30 pack year smoking history.   Past surgical hx, Family hx, Social hx all reviewed.  Current Outpatient Medications on File Prior to Visit  Medication Sig  . cyclobenzaprine (FLEXERIL) 10 MG tablet Take 1 tablet (10 mg total) by mouth 3 (three) times daily  as needed for muscle spasms.  . diclofenac (VOLTAREN) 75 MG EC tablet TAKE 1 TABLET BY MOUTH  TWICE DAILY  . lamoTRIgine (LAMICTAL) 200 MG tablet Take 1 tablet (200 mg total) by mouth 2 (two) times daily.  Marland Kitchen LORazepam (ATIVAN) 1 MG tablet TAKE 1 TABLET(1 MG) BY MOUTH TWICE DAILY AS NEEDED FOR ANXIETY  . modafinil (PROVIGIL) 200 MG tablet TAKE ONE TABLET BY MOUTH ONE TIME DAILY   . montelukast (SINGULAIR) 10 MG tablet TAKE 1 TABLET BY MOUTH AT  BEDTIME  . PROAIR RESPICLICK 784 (90 Base) MCG/ACT AEPB INHALE 2 PUFFS INTO THE LUNGS FOUR TIMES DAILY AS NEEDED  . risperiDONE (RISPERDAL) 3 MG tablet   . rosuvastatin (CRESTOR) 10 MG tablet TAKE 1 TABLET BY MOUTH  DAILY  . TRELEGY ELLIPTA 100-62.5-25 MCG/INH AEPB INHALE 1 PUFF INTO THE LUNGS DAILY  . varenicline (CHANTIX CONTINUING MONTH PAK) 1 MG tablet Take 1 tablet (1 mg total) by mouth 2 (two) times daily.  . varenicline (CHANTIX STARTING MONTH PAK) 0.5 MG X 11 & 1 MG X 42 tablet Take one 0.5 mg tablet by mouth once daily for 3 days, then increase to one 0.5 mg tablet twice daily for 4 days, then increase to one 1 mg tablet twice daily.   No current facility-administered medications on file prior to visit.     Allergies  Allergen Reactions  . Nicotine Polacrilex Rash    Nicotine patch causes rash  . Azithromycin   . Cleocin [Clindamycin Hcl]   . Oxycodone Nausea And Vomiting    Review Of Systems:  Constitutional:   No  weight loss, night sweats,  Fevers, chills, fatigue, or  lassitude.  HEENT:   No headaches,  Difficulty swallowing,  Tooth/dental problems, or  Sore throat,                No sneezing, itching, ear ache, nasal congestion, post nasal drip,   CV:  No chest pain,  Orthopnea, PND, swelling in lower extremities, anasarca, dizziness, palpitations, syncope.   GI  No heartburn, indigestion, abdominal pain, nausea, vomiting, diarrhea, change in bowel habits, loss of appetite, bloody stools.   Resp: No shortness of breath with  exertion or at rest.  No excess mucus, no productive cough,  No non-productive cough,  No coughing up of blood.  No change in color of mucus.  No wheezing.  No chest wall deformity  Skin: no rash or lesions.  GU: no dysuria, change in color of urine, no urgency or frequency.  No flank pain, no hematuria   MS:  No joint pain or swelling.  No decreased range of motion.  No back pain.  Psych:  No change in  mood or affect. No depression or anxiety.  No memory loss.   Vital Signs BP 122/74 (BP Location: Right Arm, Cuff Size: Normal)   Pulse (!) 58   Temp (!) 97.5 F (36.4 C) (Oral)   Ht 5\' 9"  (1.753 m)   Wt 220 lb (99.8 kg)   SpO2 97%   BMI 32.49 kg/m    Physical Exam:  General- No distress,  A&Ox3, pleasant ENT: No sinus tenderness, TM clear, pale nasal mucosa, no oral exudate,no post nasal drip, no LAN Cardiac: S1, S2, regular rate and rhythm, no murmur Chest: No wheeze/ rales/ dullness; no accessory muscle use, no nasal flaring, no sternal retractions Abd.: Soft Non-tender, ND, BS +, Body mass index is 32.49 kg/m. Ext: No clubbing cyanosis, edema Neuro:  normal strength, MAE x 4, A&O x 3 Skin: No rashes, No lesions, warm and dry Psych: normal mood and behavior   Assessment/Plan COPD/ Asthma Plan Continue Trelegy  Daily Rinse mouth after use Rescue albuterol as needed for shortness of breath or wheezing up to 3 times daily. If you need rescue frequently, please come into the office to be seen, as you may be working on an exacerbation. Work on quitting smoking  OSA on CPAP Last download 12/28/19-01/26/20>> Excellent compliance and Control with AHI of 1.3. Plan Continue on CPAP at bedtime. You appear to be benefiting from the treatment  We will re-enroll you in [employer]. Amy, please obtain a more recent down Load. Goal is to wear for at least 6 hours each night for maximal clinical benefit. Continue to work on weight loss, as the link between excess weight  and sleep  apnea is well established.   Remember to establish a good bedtime routine, and work on sleep hygiene.  Limit daytime naps , avoid stimulants such as caffeine and nicotine close to bedtime, exercise daily to promote sleep quality, avoid heavy , spicy, fried , or rich foods before bed. Ensure adequate exposure to natural light during the day,establish a relaxing bedtime routine with a pleasant sleep environment ( Bedroom between 60 and 67 degrees, turn off bright lights , TV or device screens screens , consider black out curtains or white noise machines) Do not drive if sleepy. Remember to clean mask, tubing, filter, and reservoir once weekly with soapy water.  Follow up with Judson Roch NP or Dr. Elsworth Soho   In 12 months  or before as needed.   Follow up Low Dose CT due 10/2020. Note your daily symptoms > remember "red flags" for COPD:  Increase in cough, increase in sputum production, increase in shortness of breath or activity intolerance. If you notice these symptoms, please call to be seen.   Please contact office for sooner follow up if symptoms do not improve or worsen or seek emergency care   Screening 10/2019>> LR 2 Annual screening recommended Plan Annual Lung Cancer Screening due 10/2020 Please work on quitting smoking. This is the single most powerful action you can take to decrease your risk of lung cancer, worsening pulonaey disease, and stroke/ CAD      Magdalen Spatz, NP 09/12/2020  2:45 PM

## 2020-09-12 NOTE — Patient Instructions (Addendum)
It is good to see you today. Continue Trelegy 1 puff once daily Rinse mouth after use Use rescue inhaler as needed for breakthrough shortness of breath or wheezing.  Please work on quitting smoking Continue on CPAP at bedtime. You appear to be benefiting from the treatment  We will re-enroll you in [employer]. Amy, please obtain a more recent down Load. Goal is to wear for at least 6 hours each night for maximal clinical benefit. Continue to work on weight loss, as the link between excess weight  and sleep apnea is well established.   Remember to establish a good bedtime routine, and work on sleep hygiene.  Limit daytime naps , avoid stimulants such as caffeine and nicotine close to bedtime, exercise daily to promote sleep quality, avoid heavy , spicy, fried , or rich foods before bed. Ensure adequate exposure to natural light during the day,establish a relaxing bedtime routine with a pleasant sleep environment ( Bedroom between 60 and 67 degrees, turn off bright lights , TV or device screens screens , consider black out curtains or white noise machines) Do not drive if sleepy. Remember to clean mask, tubing, filter, and reservoir once weekly with soapy water.  Follow up with Judson Roch NP or Dr. Elsworth Soho   In 12 months  or before as needed.   Follow up Low Dose CT due 10/2020. Note your daily symptoms > remember "red flags" for COPD:  Increase in cough, increase in sputum production, increase in shortness of breath or activity intolerance. If you notice these symptoms, please call to be seen.   Please contact office for sooner follow up if symptoms do not improve or worsen or seek emergency care

## 2020-09-24 ENCOUNTER — Ambulatory Visit (INDEPENDENT_AMBULATORY_CARE_PROVIDER_SITE_OTHER): Payer: Medicare Other | Admitting: Internal Medicine

## 2020-09-24 ENCOUNTER — Other Ambulatory Visit: Payer: Self-pay

## 2020-09-24 ENCOUNTER — Encounter: Payer: Self-pay | Admitting: Internal Medicine

## 2020-09-24 VITALS — BP 136/70 | HR 65 | Temp 98.4°F | Ht 69.0 in | Wt 218.0 lb

## 2020-09-24 DIAGNOSIS — Z23 Encounter for immunization: Secondary | ICD-10-CM | POA: Diagnosis not present

## 2020-09-24 DIAGNOSIS — J301 Allergic rhinitis due to pollen: Secondary | ICD-10-CM | POA: Diagnosis not present

## 2020-09-24 DIAGNOSIS — J4520 Mild intermittent asthma, uncomplicated: Secondary | ICD-10-CM | POA: Diagnosis not present

## 2020-09-24 DIAGNOSIS — D696 Thrombocytopenia, unspecified: Secondary | ICD-10-CM | POA: Insufficient documentation

## 2020-09-24 DIAGNOSIS — M4802 Spinal stenosis, cervical region: Secondary | ICD-10-CM

## 2020-09-24 DIAGNOSIS — M19071 Primary osteoarthritis, right ankle and foot: Secondary | ICD-10-CM

## 2020-09-24 DIAGNOSIS — J449 Chronic obstructive pulmonary disease, unspecified: Secondary | ICD-10-CM | POA: Diagnosis not present

## 2020-09-24 DIAGNOSIS — E118 Type 2 diabetes mellitus with unspecified complications: Secondary | ICD-10-CM

## 2020-09-24 MED ORDER — FLUTICASONE PROPIONATE 50 MCG/ACT NA SUSP: 2.0000 | Freq: Every day | NASAL | 1 refills | Status: DC

## 2020-09-24 NOTE — Patient Instructions (Signed)
Allergic Rhinitis, Adult  Allergic rhinitis is an allergic reaction that affects the mucous membrane inside the nose. The mucous membrane is the tissue that produces mucus. There are two types of allergic rhinitis:  Seasonal. This type is also called hay fever and happens only during certain seasons.  Perennial. This type can happen at any time of the year. Allergic rhinitis cannot be spread from person to person. This condition can be mild, moderate, or severe. It can develop at any age and may be outgrown. What are the causes? This condition is caused by allergens. These are things that can cause an allergic reaction. Allergens may differ for seasonal allergic rhinitis and perennial allergic rhinitis.  Seasonal allergic rhinitis is triggered by pollen. Pollen can come from grasses, trees, and weeds.  Perennial allergic rhinitis may be triggered by: ? Dust mites. ? Proteins in a pet's urine, saliva, or dander. Dander is dead skin cells from a pet. ? Smoke, mold, or car fumes. What increases the risk? You are more likely to develop this condition if you have a family history of allergies or other conditions related to allergies, including:  Allergic conjunctivitis. This is inflammation of parts of the eyes and eyelids.  Asthma. This condition affects the lungs and makes it hard to breathe.  Atopic dermatitis or eczema. This is long term (chronic) inflammation of the skin.  Food allergies. What are the signs or symptoms? Symptoms of this condition include:  Sneezing or coughing.  A stuffy nose (nasal congestion), itchy nose, or nasal discharge.  Itchy eyes and tearing of the eyes.  A feeling of mucus dripping down the back of your throat (postnasal drip).  Trouble sleeping.  Tiredness or fatigue.  Headache.  Sore throat. How is this diagnosed? This condition may be diagnosed with your symptoms, medical history, and physical exam. Your health care provider may check for  related conditions, such as:  Asthma.  Pink eye. This is eye inflammation caused by infection (conjunctivitis).  Ear infection.  Upper respiratory infection. This is an infection in the nose, throat, or upper airways. You may also have tests to find out which allergens trigger your symptoms. These may include skin tests or blood tests. How is this treated? There is no cure for this condition, but treatment can help control symptoms. Treatment may include:  Taking medicines that block allergy symptoms, such as corticosteroids and antihistamines. Medicine may be given as a shot, nasal spray, or pill.  Avoiding any allergens.  Being exposed again and again to tiny amounts of allergens to help you build a defense against allergens (immunotherapy). This is done if other treatments have not helped. It may include: ? Allergy shots. These are injected medicines that have small amounts of allergen in them. ? Sublingual immunotherapy. This involves taking small doses of a medicine with allergen in it under your tongue. If these treatments do not work, your health care provider may prescribe newer, stronger medicines. Follow these instructions at home: Avoiding allergens Find out what you are allergic to and avoid those allergens. These are some things you can do to help avoid allergens:  If you have perennial allergies: ? Replace carpet with wood, tile, or vinyl flooring. Carpet can trap dander and dust. ? Do not smoke. Do not allow smoking in your home. ? Change your heating and air conditioning filters at least once a month.  If you have seasonal allergies, take these steps during allergy season: ? Keep windows closed as much as possible. ?   Plan outdoor activities when pollen counts are lowest. Check pollen counts before you plan outdoor activities. ? When coming indoors, change clothing and shower before sitting on furniture or bedding.  If you have a pet in the house that produces  allergens: ? Keep the pet out of the bedroom. ? Vacuum, sweep, and dust regularly. General instructions  Take over-the-counter and prescription medicines only as told by your health care provider.  Drink enough fluid to keep your urine pale yellow.  Keep all follow-up visits as told by your health care provider. This is important. Where to find more information  American Academy of Allergy, Asthma & Immunology: www.aaaai.org Contact a health care provider if:  You have a fever.  You develop a cough that does not go away.  You make whistling sounds when you breathe (wheeze).  Your symptoms slow you down or stop you from doing your normal activities each day. Get help right away if:  You have shortness of breath. This symptom may represent a serious problem that is an emergency. Do not wait to see if the symptom will go away. Get medical help right away. Call your local emergency services (911 in the [place] Do not drive yourself to the hospital. Summary  Allergic rhinitis may be managed by taking medicines as directed and avoiding allergens.  If you have seasonal allergies, keep windows closed as much as possible during allergy season.  Contact your health care provider if you develop a fever or a cough that does not go away. This information is not intended to replace advice given to you by your health care provider. Make sure you discuss any questions you have with your health care provider. Document Revised: 07/22/2019 Document Reviewed: 05/31/2019 Elsevier Patient Education  2021 [employer]  

## 2020-09-24 NOTE — Progress Notes (Signed)
Subjective:  Patient ID: SHAWN CARATTINI, male    DOB: 1965-09-24  Age: 55 y.o. MRN: [id number]  CC: Allergic Rhinitis , Diabetes, and COPD  This visit occurred during the SARS-CoV-2 public health emergency.  Safety protocols were in place, including screening questions prior to the visit, additional usage of staff PPE, and extensive cleaning of exam room while observing appropriate contact time as indicated for disinfecting solutions.    HPI LYNFORD ESPINOZA presents for f/up -   He has his baseline symptoms of rare nonproductive cough and wheezing.  He has not used his Trelegy inhaler today yet.  He continues to complain of nonradiating neck pain and wants a refill of the muscle relaxer.  Outpatient Medications Prior to Visit  Medication Sig Dispense Refill  . diclofenac (VOLTAREN) 75 MG EC tablet TAKE 1 TABLET BY MOUTH  TWICE DAILY 180 tablet 0  . lamoTRIgine (LAMICTAL) 200 MG tablet Take 1 tablet (200 mg total) by mouth 2 (two) times daily. 180 tablet 1  . LORazepam (ATIVAN) 1 MG tablet TAKE 1 TABLET(1 MG) BY MOUTH TWICE DAILY AS NEEDED FOR ANXIETY 60 tablet 3  . MAGNESIUM-ZINC PO Take by mouth.    . modafinil (PROVIGIL) 200 MG tablet TAKE ONE TABLET BY MOUTH ONE TIME DAILY  30 tablet 4  . montelukast (SINGULAIR) 10 MG tablet TAKE 1 TABLET BY MOUTH AT  BEDTIME 90 tablet 1  . Multiple Vitamin (MULTIVITAMIN) tablet Take 1 tablet by mouth daily.    . Multiple Vitamins-Minerals (VITAMIN D3 COMPLETE PO) Take by mouth.    . Omega-3 Fatty Acids (FISH OIL) 1000 MG CAPS Take 2 capsules by mouth daily.    . risperiDONE (RISPERDAL) 2 MG tablet Take 2 mg by mouth 2 (two) times daily.    . rosuvastatin (CRESTOR) 10 MG tablet TAKE 1 TABLET BY MOUTH  DAILY 90 tablet 1  . TRELEGY ELLIPTA 100-62.5-25 MCG/INH AEPB INHALE 1 PUFF INTO THE LUNGS DAILY 120 each 1  . Ascorbic Acid (VITAMIN C) 100 MG tablet Take 100 mg by mouth daily.    . cyclobenzaprine (FLEXERIL) 10 MG tablet Take 1 tablet (10 mg total)  by mouth 3 (three) times daily as needed for muscle spasms. 45 tablet 2  . PROAIR RESPICLICK 277 (90 Base) MCG/ACT AEPB INHALE 2 PUFFS INTO THE LUNGS FOUR TIMES DAILY AS NEEDED 1 each 5  . varenicline (CHANTIX CONTINUING MONTH PAK) 1 MG tablet Take 1 tablet (1 mg total) by mouth 2 (two) times daily. (Patient not taking: Reported on 09/24/2020) 60 tablet 3  . varenicline (CHANTIX STARTING MONTH PAK) 0.5 MG X 11 & 1 MG X 42 tablet Take one 0.5 mg tablet by mouth once daily for 3 days, then increase to one 0.5 mg tablet twice daily for 4 days, then increase to one 1 mg tablet twice daily. (Patient not taking: Reported on 09/24/2020) 53 tablet 0   No facility-administered medications prior to visit.    ROS Review of Systems  Constitutional: Negative for appetite change, diaphoresis, fatigue and unexpected weight change.  HENT: Positive for congestion, postnasal drip, rhinorrhea and sneezing. Negative for sinus pressure, sinus pain and trouble swallowing.   Eyes: Negative.   Respiratory: Positive for shortness of breath and wheezing. Negative for cough, choking, chest tightness and stridor.   Cardiovascular: Negative for chest pain, palpitations and leg swelling.  Gastrointestinal: Negative for abdominal pain, constipation, diarrhea, nausea and vomiting.  Endocrine: Negative.   Genitourinary: Negative.  Negative for difficulty urinating.  Musculoskeletal: Positive for arthralgias and neck pain. Negative for back pain and myalgias.  Skin: Negative.  Negative for color change and pallor.  Neurological: Negative.  Negative for dizziness, weakness and light-headedness.  Hematological: Negative for adenopathy. Does not bruise/bleed easily.  Psychiatric/Behavioral: Negative.     Objective:  BP 136/70 (BP Location: Right Arm, Patient Position: Sitting, Cuff Size: Large)   Pulse 65   Temp 98.4 F (36.9 C) (Oral)   Ht 5\' 9"  (1.753 m)   Wt 218 lb (98.9 kg)   SpO2 95%   BMI 32.19 kg/m   BP Readings  from Last 3 Encounters:  09/24/20 136/70  09/12/20 122/74  01/11/20 110/68    Wt Readings from Last 3 Encounters:  09/24/20 218 lb (98.9 kg)  09/12/20 220 lb (99.8 kg)  01/11/20 (!) 205 lb (93 kg)    Physical Exam Vitals reviewed.  Constitutional:      General: He is not in acute distress.    Appearance: He is not ill-appearing or toxic-appearing.  HENT:     Nose: Nose normal.     Mouth/Throat:     Mouth: Mucous membranes are moist.  Eyes:     General: No scleral icterus.    Conjunctiva/sclera: Conjunctivae normal.  Cardiovascular:     Rate and Rhythm: Normal rate and regular rhythm.     Heart sounds: No murmur heard.   Pulmonary:     Effort: No tachypnea, accessory muscle usage or respiratory distress.     Breath sounds: Examination of the right-middle field reveals wheezing. Examination of the left-middle field reveals wheezing. Wheezing present. No decreased breath sounds, rhonchi or rales.  Abdominal:     General: Abdomen is flat.     Palpations: There is no mass.     Tenderness: There is no abdominal tenderness. There is no guarding.     Hernia: No hernia is present.  Musculoskeletal:        General: Normal range of motion.     Cervical back: Neck supple.     Right lower leg: No edema.     Left lower leg: No edema.  Lymphadenopathy:     Cervical: No cervical adenopathy.  Skin:    General: Skin is warm and dry.     Coloration: Skin is not pale.  Neurological:     General: No focal deficit present.     Mental Status: He is alert.  Psychiatric:        Mood and Affect: Mood normal.        Behavior: Behavior normal.     Lab Results  Component Value Date   WBC 9.3 09/24/2020   HGB 14.1 09/24/2020   HCT 40.8 09/24/2020   PLT 142.0 (L) 09/24/2020   GLUCOSE 103 (H) 09/24/2020   CHOL 112 10/19/2019   TRIG 186.0 (H) 10/19/2019   HDL 38.30 (L) 10/19/2019   LDLDIRECT 59.0 12/02/2018   LDLCALC 37 10/19/2019   ALT 31 01/11/2020   AST 23 01/11/2020   NA 139  09/24/2020   K 3.9 09/24/2020   CL 104 09/24/2020   CREATININE 0.88 09/24/2020   BUN 20 09/24/2020   CO2 25 09/24/2020   TSH 1.61 12/02/2018   PSA 0.92 12/28/2019   INR 1.0 01/11/2020   HGBA1C 5.7 09/24/2020   MICROALBUR 5.7 (H) 12/02/2018    DG Chest 2 View  Result Date: 01/12/2020 CLINICAL DATA:  Follow-up pneumonia EXAM: CHEST - 2 VIEW COMPARISON:  12/28/2019, CT chest 10/17/2019 FINDINGS: The heart size  and mediastinal contours are within normal limits. Both lungs are clear. The visualized skeletal structures are unremarkable. IMPRESSION: No active cardiopulmonary disease. Electronically Signed   By: Donavan Foil M.D.   On: 01/12/2020 23:11    Assessment & Plan:   Maddon was seen today for allergic rhinitis , diabetes and copd.  Diagnoses and all orders for this visit:  Type II diabetes mellitus with manifestations ([place])- His blood sugar is adequately well controlled. -     Basic metabolic panel; Future -     Hemoglobin A1c; Future -     Hemoglobin A1c -     Basic metabolic panel  Thrombocytopenia ([place])- His platelet count is stable.  There is no history of bleeding or bruising.  B12 and folate are normal.  This is likely ITP. -     CBC with Differential/Platelet; Future -     Vitamin B12; Future -     Folate; Future -     Folate -     Vitamin B12 -     CBC with Differential/Platelet  Need for vaccination -     Pneumococcal conjugate vaccine 20-valent (Prevnar 20)  Seasonal allergic rhinitis due to pollen -     fluticasone (FLONASE) 50 MCG/ACT nasal spray; Place 2 sprays into both nostrils daily.  Osteoarthritis of right ankle and foot  Spinal stenosis in cervical region -     cyclobenzaprine (FLEXERIL) 10 MG tablet; Take 1 tablet (10 mg total) by mouth 3 (three) times daily as needed for muscle spasms.  COPD with asthma ([place])- I have asked him to be more compliant with the Trelegy inhaler. -     Albuterol Sulfate (PROAIR RESPICLICK) 563 (90 Base) MCG/ACT AEPB;  Inhale 1 puff into the lungs 3 (three) times daily as needed.  Asthma, mild intermittent, poorly controlled -     Albuterol Sulfate (PROAIR RESPICLICK) 893 (90 Base) MCG/ACT AEPB; Inhale 1 puff into the lungs 3 (three) times daily as needed.   I have discontinued Cashius Grandstaff. Montero's varenicline, Chantix Starting [employer], and vitamin C. I have also changed his ProAir RespiClick. Additionally, I am having him start on fluticasone. Lastly, I am having him maintain his modafinil, risperiDONE, lamoTRIgine, rosuvastatin, LORazepam, montelukast, Trelegy Ellipta, diclofenac, multivitamin, Fish Oil, MAGNESIUM-ZINC PO, Multiple Vitamins-Minerals (VITAMIN D3 COMPLETE PO), and cyclobenzaprine.  Meds ordered this encounter  Medications  . fluticasone (FLONASE) 50 MCG/ACT nasal spray    Sig: Place 2 sprays into both nostrils daily.    Dispense:  48 g    Refill:  1  . cyclobenzaprine (FLEXERIL) 10 MG tablet    Sig: Take 1 tablet (10 mg total) by mouth 3 (three) times daily as needed for muscle spasms.    Dispense:  45 tablet    Refill:  2  . Albuterol Sulfate (PROAIR RESPICLICK) 734 (90 Base) MCG/ACT AEPB    Sig: Inhale 1 puff into the lungs 3 (three) times daily as needed.    Dispense:  1 each    Refill:  5     Follow-up: Return in about 6 months (around 03/26/2021).  Scarlette Calico, MD

## 2020-09-25 ENCOUNTER — Other Ambulatory Visit: Payer: Self-pay | Admitting: Internal Medicine

## 2020-09-25 DIAGNOSIS — M4802 Spinal stenosis, cervical region: Secondary | ICD-10-CM

## 2020-09-25 DIAGNOSIS — M19071 Primary osteoarthritis, right ankle and foot: Secondary | ICD-10-CM

## 2020-09-25 LAB — CBC WITH DIFFERENTIAL/PLATELET
Basophils Absolute: 0.2 10*3/uL — ABNORMAL HIGH (ref 0.0–0.1)
Basophils Relative: 1.9 % (ref 0.0–3.0)
Eosinophils Absolute: 0.4 10*3/uL (ref 0.0–0.7)
Eosinophils Relative: 3.8 % (ref 0.0–5.0)
HCT: 40.8 % (ref 39.0–52.0)
Hemoglobin: 14.1 g/dL (ref 13.0–17.0)
Lymphocytes Relative: 33.5 % (ref 12.0–46.0)
Lymphs Abs: 3.1 10*3/uL (ref 0.7–4.0)
MCHC: 34.5 g/dL (ref 30.0–36.0)
MCV: 84.3 fl (ref 78.0–100.0)
Monocytes Absolute: 0.8 10*3/uL (ref 0.1–1.0)
Monocytes Relative: 8.5 % (ref 3.0–12.0)
Neutro Abs: 4.9 10*3/uL (ref 1.4–7.7)
Neutrophils Relative %: 52.3 % (ref 43.0–77.0)
Platelets: 142 10*3/uL — ABNORMAL LOW (ref 150.0–400.0)
RBC: 4.83 Mil/uL (ref 4.22–5.81)
RDW: 12.9 % (ref 11.5–15.5)
WBC: 9.3 10*3/uL (ref 4.0–10.5)

## 2020-09-25 LAB — BASIC METABOLIC PANEL
BUN: 20 mg/dL (ref 6–23)
CO2: 25 mEq/L (ref 19–32)
Calcium: 9.8 mg/dL (ref 8.4–10.5)
Chloride: 104 mEq/L (ref 96–112)
Creatinine, Ser: 0.88 mg/dL (ref 0.40–1.50)
GFR: 96.43 mL/min (ref >60.00)
Glucose, Bld: 103 mg/dL — ABNORMAL HIGH (ref 70–99)
Potassium: 3.9 mEq/L (ref 3.5–5.1)
Sodium: 139 mEq/L (ref 135–145)

## 2020-09-25 LAB — VITAMIN B12: Vitamin B-12: 854 pg/mL (ref 211–911)

## 2020-09-25 LAB — FOLATE: Folate: >23.6 ng/mL (ref >5.9)

## 2020-09-25 LAB — HEMOGLOBIN A1C: Hgb A1c MFr Bld: 5.7 % (ref 4.6–6.5)

## 2020-09-26 MED ORDER — CYCLOBENZAPRINE HCL 10 MG PO TABS: 10.0000 mg | ORAL_TABLET | Freq: Three times a day (TID) | ORAL | 2 refills | Status: DC | PRN

## 2020-09-26 MED ORDER — PROAIR RESPICLICK 108 (90 BASE) MCG/ACT IN AEPB
1.0000 | INHALATION_SPRAY | Freq: Three times a day (TID) | RESPIRATORY_TRACT | 5 refills | Status: DC | PRN
Start: 2020-09-26 — End: 2022-08-31

## 2020-10-04 ENCOUNTER — Other Ambulatory Visit: Payer: Self-pay | Admitting: Internal Medicine

## 2020-10-04 DIAGNOSIS — E785 Hyperlipidemia, unspecified: Secondary | ICD-10-CM

## 2020-10-09 ENCOUNTER — Encounter: Payer: Self-pay | Admitting: Internal Medicine

## 2020-10-17 ENCOUNTER — Other Ambulatory Visit: Payer: Self-pay

## 2020-10-17 ENCOUNTER — Ambulatory Visit (INDEPENDENT_AMBULATORY_CARE_PROVIDER_SITE_OTHER)
Admission: RE | Admit: 2020-10-17 | Discharge: 2020-10-17 | Disposition: A | Discharge status: Home / Self Care | Payer: Medicare Other | Source: Ambulatory Visit | Attending: Internal Medicine | Admitting: Internal Medicine

## 2020-10-17 DIAGNOSIS — F17210 Nicotine dependence, cigarettes, uncomplicated: Secondary | ICD-10-CM | POA: Diagnosis not present

## 2020-10-17 DIAGNOSIS — Z87891 Personal history of nicotine dependence: Secondary | ICD-10-CM | POA: Diagnosis not present

## 2020-10-24 NOTE — Progress Notes (Signed)
Please call patient and let them  know their  low dose Ct was read as a Lung RADS 2: nodules that are benign in appearance and behavior with a very low likelihood of becoming a clinically active cancer due to size or lack of growth. Recommendation per radiology is for a repeat LDCT in 12 months. .Please let them  know we will order and schedule their  annual screening scan for 10/2021. Please let them  know there was notation of CAD on their  scan.  Please remind the patient  that this is a non-gated exam therefore degree or severity of disease  cannot be determined. Please have them  follow up with their PCP regarding potential risk factor modification, dietary therapy or pharmacologic therapy if clinically indicated. Pt.  is  currently on statin therapy. Please place order for annual  screening scan for  10/2021 and fax results to PCP. Thanks so much. 

## 2020-10-25 ENCOUNTER — Other Ambulatory Visit: Payer: Self-pay | Admitting: *Deleted

## 2020-10-25 DIAGNOSIS — F17210 Nicotine dependence, cigarettes, uncomplicated: Secondary | ICD-10-CM

## 2020-10-25 DIAGNOSIS — Z87891 Personal history of nicotine dependence: Secondary | ICD-10-CM

## 2020-10-28 ENCOUNTER — Other Ambulatory Visit: Payer: Self-pay | Admitting: Internal Medicine

## 2020-10-28 DIAGNOSIS — J449 Chronic obstructive pulmonary disease, unspecified: Secondary | ICD-10-CM

## 2020-11-02 ENCOUNTER — Telehealth: Payer: Self-pay | Admitting: Pharmacist

## 2020-11-02 NOTE — Progress Notes (Signed)
Chronic Care Management Pharmacy Assistant   Name: Jeff Norton  MRN: [id number] DOB: 08/03/1965  Reason for Encounter: Disease State   Conditions to be addressed/monitored: COPD  Recent office visits:  09/24/20 [name] (PCP) - Diabetes. Start Fluticasone. Decrease Albuterol to 1 puff prn. D/c Ascorbic acid & Varenicline tartrate. Pnemo vaccine given. F/u 6 mos.  Recent consult visits:  09/12/20 [facility] (Pulmonary) - COPD w/asthma. Lung cancer screening due 5/22.  07/09/20 [facility] (Dermatology) - Actinic keratosis, Melanocytic nevi.  05/20/20 [facility]) - Severe Asthma, Pneumonia of right middle lobe.   02/13/20 [facility] (Dermatology) - Neoplasm of the skin.  Hospital visits:  None in previous 6 months  Medications: Outpatient Encounter Medications as of 11/02/2020  Medication Sig  . Albuterol Sulfate (PROAIR RESPICLICK) 250 (90 Base) MCG/ACT AEPB Inhale 1 puff into the lungs 3 (three) times daily as needed.  . cyclobenzaprine (FLEXERIL) 10 MG tablet Take 1 tablet (10 mg total) by mouth 3 (three) times daily as needed for muscle spasms.  . diclofenac (VOLTAREN) 75 MG EC tablet TAKE 1 TABLET BY MOUTH  TWICE DAILY  . fluticasone (FLONASE) 50 MCG/ACT nasal spray Place 2 sprays into both nostrils daily.  Marland Kitchen lamoTRIgine (LAMICTAL) 200 MG tablet Take 1 tablet (200 mg total) by mouth 2 (two) times daily.  Marland Kitchen LORazepam (ATIVAN) 1 MG tablet TAKE 1 TABLET(1 MG) BY MOUTH TWICE DAILY AS NEEDED FOR ANXIETY  . MAGNESIUM-ZINC PO Take by mouth.  . modafinil (PROVIGIL) 200 MG tablet TAKE ONE TABLET BY MOUTH ONE TIME DAILY   . montelukast (SINGULAIR) 10 MG tablet TAKE 1 TABLET BY MOUTH AT  BEDTIME  . Multiple Vitamin (MULTIVITAMIN) tablet Take 1 tablet by mouth daily.  . Multiple Vitamins-Minerals (VITAMIN D3 COMPLETE PO) Take by mouth.  . Omega-3 Fatty Acids (FISH OIL) 1000 MG CAPS Take 2 capsules by mouth daily.  . risperiDONE (RISPERDAL) 2 MG tablet Take 2 mg by mouth 2  (two) times daily.  . rosuvastatin (CRESTOR) 10 MG tablet TAKE 1 TABLET BY MOUTH  DAILY  . TRELEGY ELLIPTA 100-62.5-25 MCG/INH AEPB INHALE 1 PUFF INTO THE LUNGS DAILY   No facility-administered encounter medications on file as of 11/02/2020.   . Current COPD regimen:   Proair Respiclick 539 mcg/act 2 puff QID PRN   Trelegy Ellipta 1 puff daily (LF 08/28/19 30DS) - QOD  Montelukast 10 mg QHS  . No flowsheet data found.   . Any recent hospitalizations or ED visits since last visit with CPP? No   . What recent interventions/DTPs have been made by any provider to improve breathing since last visit: o None noted  . Have you had exacerbation/flare-up since last visit? No   . What do you do when you are short of breath?  Rescue medication  Respiratory Devices/Equipment . Do you have a nebulizer? No   . Do you use a Peak Flow Meter? No   . Do you use a maintenance inhaler? Yes   . How often do you forget to use your daily inhaler? None  . Do you use a rescue inhaler? Yes   . How often do you use your rescue inhaler?  infrequently   . Do you use a spacer with your inhaler? No  Adherence Review: . Does the patient have >5 day gap between last estimated fill date for maintenance inhaler medications? No  Proair Respiclick - last fill 7/67/34  Trelegy Ellipta - last fill 10/28/20  Montelukast - last fill 10/11/20  90D   Star Rating Drugs: Rosuvastatin - last fill 10/31/20 90D Patient wants to know if his Rosuvastatin should be increase due to his triglycerides still being high.   Orinda Kenner, [place] Clinical Pharmacists Assistant [phone]  Time Spent: [phone]

## 2020-11-14 ENCOUNTER — Telehealth: Payer: Self-pay | Admitting: Pharmacist

## 2020-11-14 NOTE — Progress Notes (Signed)
Called and informed patient that per [name], CPP his Rosuvastatin does not need to be increase at this time, patient acknowledged understanding.  Orinda Kenner, [place] Clinical Pharmacists Assistant [phone]  Time Spent: 31

## 2020-11-28 ENCOUNTER — Telehealth: Payer: Self-pay | Admitting: Internal Medicine

## 2020-11-28 NOTE — Telephone Encounter (Signed)
Patient said that he would like to inquire about patient assistance for TRELEGY ELLIPTA 100-62.5-25 MCG/INH AEPB. He can be reached at [phone]. Please advise

## 2020-11-30 ENCOUNTER — Telehealth: Payer: Self-pay

## 2020-11-30 NOTE — Telephone Encounter (Signed)
Key: [id number]

## 2020-12-03 NOTE — Telephone Encounter (Signed)
Per CoverMyMeds-  This medication or product is on your plan's list of covered drugs. Prior authorization is not required at this time. 

## 2020-12-03 NOTE — Telephone Encounter (Signed)
Patient is not sure if he has met OOP spend yet, he will consult his pharmacies and let us know.

## 2020-12-03 NOTE — Progress Notes (Signed)
Called and spoke with patient regarding PAP for Trelegy, patient states he meets the income requirement but is not sure not sure if he met deductible yet. Patient states he will have to get his print outs from both of his pharmacy.  Orinda Kenner, [place] Clinical Pharmacists Assistant [phone]  Time Spent:  [phone]

## 2020-12-06 DIAGNOSIS — H903 Sensorineural hearing loss, bilateral: Secondary | ICD-10-CM | POA: Diagnosis not present

## 2020-12-06 DIAGNOSIS — H6123 Impacted cerumen, bilateral: Secondary | ICD-10-CM | POA: Diagnosis not present

## 2020-12-11 DIAGNOSIS — G4733 Obstructive sleep apnea (adult) (pediatric): Secondary | ICD-10-CM | POA: Diagnosis not present

## 2020-12-12 ENCOUNTER — Other Ambulatory Visit: Payer: Self-pay | Admitting: Internal Medicine

## 2020-12-12 DIAGNOSIS — J301 Allergic rhinitis due to pollen: Secondary | ICD-10-CM

## 2020-12-18 ENCOUNTER — Telehealth: Payer: Self-pay | Admitting: Pharmacist

## 2020-12-18 NOTE — Progress Notes (Signed)
    Chronic Care Management Pharmacy Assistant   Name: Jeff Norton  MRN: [id number] DOB: 1965/12/17   Reason for Encounter: Disease State   Conditions to be addressed/monitored: COPD   Recent office visits:  None ID  Recent consult visits:  None ID  Hospital visits:  None in previous 6 months  Medications: Outpatient Encounter Medications as of 12/18/2020  Medication Sig   Albuterol Sulfate (PROAIR RESPICLICK) 458 (90 Base) MCG/ACT AEPB Inhale 1 puff into the lungs 3 (three) times daily as needed.   cyclobenzaprine (FLEXERIL) 10 MG tablet Take 1 tablet (10 mg total) by mouth 3 (three) times daily as needed for muscle spasms.   diclofenac (VOLTAREN) 75 MG EC tablet TAKE 1 TABLET BY MOUTH  TWICE DAILY   fluticasone (FLONASE) 50 MCG/ACT nasal spray Place 2 sprays into both nostrils daily.   lamoTRIgine (LAMICTAL) 200 MG tablet Take 1 tablet (200 mg total) by mouth 2 (two) times daily.   LORazepam (ATIVAN) 1 MG tablet TAKE 1 TABLET(1 MG) BY MOUTH TWICE DAILY AS NEEDED FOR ANXIETY   MAGNESIUM-ZINC PO Take by mouth.   modafinil (PROVIGIL) 200 MG tablet TAKE ONE TABLET BY MOUTH ONE TIME DAILY    montelukast (SINGULAIR) 10 MG tablet TAKE 1 TABLET BY MOUTH AT  BEDTIME   Multiple Vitamin (MULTIVITAMIN) tablet Take 1 tablet by mouth daily.   Multiple Vitamins-Minerals (VITAMIN D3 COMPLETE PO) Take by mouth.   Omega-3 Fatty Acids (FISH OIL) 1000 MG CAPS Take 2 capsules by mouth daily.   risperiDONE (RISPERDAL) 2 MG tablet Take 2 mg by mouth 2 (two) times daily.   rosuvastatin (CRESTOR) 10 MG tablet TAKE 1 TABLET BY MOUTH  DAILY   TRELEGY ELLIPTA 100-62.5-25 MCG/INH AEPB INHALE 1 PUFF INTO THE LUNGS DAILY   No facility-administered encounter medications on file as of 12/18/2020.    Pharmacist Review Current COPD regimen: Patient is taking Proair respiclick 099 mcg/act 2 puff QID prn Montelukast 10 mg Trelegy Ellipta 1 puff  daily  No flowsheet data found.  Any recent  hospitalizations or ED visits since last visit with CPP? No  Reports no COPD symptoms  What recent interventions/DTPs have been made by any provider to improve breathing since last visit: None ID  Have you had exacerbation/flare-up since last visit? No  What do you do when you are short of breath?  Rest, patient states when he is having trouble breathing he sits down so he can catch his breath  Respiratory Devices/Equipment Do you have a nebulizer? No Do you use a Peak Flow Meter? No Do you use a maintenance inhaler? Yes How often do you forget to use your daily inhaler? Patient states that he does forget to use inhaler often Do you use a rescue inhaler? Yes How often do you use your rescue inhaler?   Patient states he use rescue inhaler only when needed and he has not needed it lately Do you use a spacer with your inhaler? No  Adherence Review: Does the patient have >5 day gap between last estimated fill date for maintenance inhaler medications? No    Star Rating Drugs: Rosuvastatin   Ethelene Hal Clinical Pharmacist Assistant [phone]   Time spent:22

## 2021-01-23 ENCOUNTER — Other Ambulatory Visit: Payer: Self-pay | Admitting: Internal Medicine

## 2021-01-23 DIAGNOSIS — M4802 Spinal stenosis, cervical region: Secondary | ICD-10-CM

## 2021-01-23 DIAGNOSIS — F411 Generalized anxiety disorder: Secondary | ICD-10-CM

## 2021-01-25 DIAGNOSIS — H524 Presbyopia: Secondary | ICD-10-CM | POA: Diagnosis not present

## 2021-01-25 DIAGNOSIS — E119 Type 2 diabetes mellitus without complications: Secondary | ICD-10-CM | POA: Diagnosis not present

## 2021-01-25 LAB — HM DIABETES EYE EXAM

## 2021-01-30 ENCOUNTER — Ambulatory Visit: Payer: Medicare Other

## 2021-02-01 DIAGNOSIS — K116 Mucocele of salivary gland: Secondary | ICD-10-CM | POA: Diagnosis not present

## 2021-02-13 ENCOUNTER — Ambulatory Visit: Payer: Medicare Other

## 2021-02-14 ENCOUNTER — Encounter: Payer: Self-pay | Admitting: Internal Medicine

## 2021-03-01 ENCOUNTER — Other Ambulatory Visit: Payer: Self-pay

## 2021-03-04 ENCOUNTER — Other Ambulatory Visit: Payer: Self-pay

## 2021-03-04 ENCOUNTER — Ambulatory Visit: Payer: Medicare Other

## 2021-03-04 ENCOUNTER — Encounter: Payer: Self-pay | Admitting: Internal Medicine

## 2021-03-04 ENCOUNTER — Ambulatory Visit (INDEPENDENT_AMBULATORY_CARE_PROVIDER_SITE_OTHER): Payer: Medicare Other | Admitting: Internal Medicine

## 2021-03-04 VITALS — BP 110/60 | HR 66 | Temp 97.9°F | Ht 69.0 in | Wt 217.0 lb

## 2021-03-04 DIAGNOSIS — E785 Hyperlipidemia, unspecified: Secondary | ICD-10-CM

## 2021-03-04 DIAGNOSIS — Z0001 Encounter for general adult medical examination with abnormal findings: Secondary | ICD-10-CM | POA: Diagnosis not present

## 2021-03-04 DIAGNOSIS — N401 Enlarged prostate with lower urinary tract symptoms: Secondary | ICD-10-CM | POA: Diagnosis not present

## 2021-03-04 DIAGNOSIS — E118 Type 2 diabetes mellitus with unspecified complications: Secondary | ICD-10-CM | POA: Diagnosis not present

## 2021-03-04 DIAGNOSIS — Z23 Encounter for immunization: Secondary | ICD-10-CM | POA: Diagnosis not present

## 2021-03-04 DIAGNOSIS — R351 Nocturia: Secondary | ICD-10-CM | POA: Diagnosis not present

## 2021-03-04 DIAGNOSIS — R195 Other fecal abnormalities: Secondary | ICD-10-CM | POA: Diagnosis not present

## 2021-03-04 DIAGNOSIS — D696 Thrombocytopenia, unspecified: Secondary | ICD-10-CM | POA: Diagnosis not present

## 2021-03-04 NOTE — Patient Instructions (Signed)
Health Maintenance, Male Adopting a healthy lifestyle and getting preventive care are important in promoting health and wellness. Ask your health care provider about: The right schedule for you to have regular tests and exams. Things you can do on your own to prevent diseases and keep yourself healthy. What should I know about diet, weight, and exercise? Eat a healthy diet  Eat a diet that includes plenty of vegetables, fruits, low-fat dairy products, and lean protein. Do not eat a lot of foods that are high in solid fats, added sugars, or sodium. Maintain a healthy weight Body mass index (BMI) is a measurement that can be used to identify possible weight problems. It estimates body fat based on height and weight. Your health care provider can help determine your BMI and help you achieve or maintain a healthy weight. Get regular exercise Get regular exercise. This is one of the most important things you can do for your health. Most adults should: Exercise for at least 150 minutes each week. The exercise should increase your heart rate and make you sweat (moderate-intensity exercise). Do strengthening exercises at least twice a week. This is in addition to the moderate-intensity exercise. Spend less time sitting. Even light physical activity can be beneficial. Watch cholesterol and blood lipids Have your blood tested for lipids and cholesterol at 56 years of age, then have this test every 5 years. You may need to have your cholesterol levels checked more often if: Your lipid or cholesterol levels are high. You are older than 56 years of age. You are at high risk for heart disease. What should I know about cancer screening? Many types of cancers can be detected early and may often be prevented. Depending on your health history and family history, you may need to have cancer screening at various ages. This may include screening for: Colorectal cancer. Prostate cancer. Skin cancer. Lung  cancer. What should I know about heart disease, diabetes, and high blood pressure? Blood pressure and heart disease High blood pressure causes heart disease and increases the risk of stroke. This is more likely to develop in people who have high blood pressure readings, are of African descent, or are overweight. Talk with your health care provider about your target blood pressure readings. Have your blood pressure checked: Every 3-5 years if you are 18-39 years of age. Every year if you are 40 years old or older. If you are between the ages of 65 and 75 and are a current or former smoker, ask your health care provider if you should have a one-time screening for abdominal aortic aneurysm (AAA). Diabetes Have regular diabetes screenings. This checks your fasting blood sugar level. Have the screening done: Once every three years after age 45 if you are at a normal weight and have a low risk for diabetes. More often and at a younger age if you are overweight or have a high risk for diabetes. What should I know about preventing infection? Hepatitis B If you have a higher risk for hepatitis B, you should be screened for this virus. Talk with your health care provider to find out if you are at risk for hepatitis B infection. Hepatitis C Blood testing is recommended for: Everyone born from 1945 through 1965. Anyone with known risk factors for hepatitis C. Sexually transmitted infections (STIs) You should be screened each year for STIs, including gonorrhea and chlamydia, if: You are sexually active and are younger than 56 years of age. You are older than 56 years   of age and your health care provider tells you that you are at risk for this type of infection. Your sexual activity has changed since you were last screened, and you are at increased risk for chlamydia or gonorrhea. Ask your health care provider if you are at risk. Ask your health care provider about whether you are at high risk for HIV.  Your health care provider may recommend a prescription medicine to help prevent HIV infection. If you choose to take medicine to prevent HIV, you should first get tested for HIV. You should then be tested every 3 months for as long as you are taking the medicine. Follow these instructions at home: Lifestyle Do not use any products that contain nicotine or tobacco, such as cigarettes, e-cigarettes, and chewing tobacco. If you need help quitting, ask your health care provider. Do not use street drugs. Do not share needles. Ask your health care provider for help if you need support or information about quitting drugs. Alcohol use Do not drink alcohol if your health care provider tells you not to drink. If you drink alcohol: Limit how much you have to 0-2 drinks a day. Be aware of how much alcohol is in your drink. In the [place] one drink equals one 12 oz bottle of beer (355 mL), one 5 oz glass of wine (148 mL), or one 1 oz glass of hard liquor (44 mL). General instructions Schedule regular health, dental, and eye exams. Stay current with your vaccines. Tell your health care provider if: You often feel depressed. You have ever been abused or do not feel safe at home. Summary Adopting a healthy lifestyle and getting preventive care are important in promoting health and wellness. Follow your health care provider's instructions about healthy diet, exercising, and getting tested or screened for diseases. Follow your health care provider's instructions on monitoring your cholesterol and blood pressure. This information is not intended to replace advice given to you by your health care provider. Make sure you discuss any questions you have with your health care provider. Document Revised: 08/10/2020 Document Reviewed: 05/26/2018 Elsevier Patient Education  2022 [employer]  

## 2021-03-04 NOTE — Progress Notes (Signed)
Subjective:  Patient ID: Jeff Norton, male    DOB: 04-03-1966  Age: 55 y.o. MRN: [id number]  CC: Annual Exam  This visit occurred during the SARS-CoV-2 public health emergency.  Safety protocols were in place, including screening questions prior to the visit, additional usage of staff PPE, and extensive cleaning of exam room while observing appropriate contact time as indicated for disinfecting solutions.    HPI Jeff Norton presents for a CPX and f/up -  He has felt well recently - offers no complaints.   Outpatient Medications Prior to Visit  Medication Sig Dispense Refill   Albuterol Sulfate (PROAIR RESPICLICK) [id number] (90 Base) MCG/ACT AEPB Inhale 1 puff into the lungs 3 (three) times daily as needed. 1 each 5   cyclobenzaprine (FLEXERIL) 10 MG tablet Take 1 tablet (10 mg total) by mouth 3 (three) times daily as needed for muscle spasms. 45 tablet 2   diclofenac (VOLTAREN) 75 MG EC tablet TAKE 1 TABLET BY MOUTH  TWICE DAILY 180 tablet 0   fluticasone (FLONASE) 50 MCG/ACT nasal spray Place 2 sprays into both nostrils daily. 48 g 1   lamoTRIgine (LAMICTAL) 200 MG tablet Take 1 tablet (200 mg total) by mouth 2 (two) times daily. 180 tablet 1   LORazepam (ATIVAN) 1 MG tablet TAKE 1 TABLET(1 MG) BY MOUTH TWICE DAILY AS NEEDED FOR ANXIETY 60 tablet 3   MAGNESIUM-ZINC PO Take by mouth.     montelukast (SINGULAIR) 10 MG tablet TAKE 1 TABLET BY MOUTH AT  BEDTIME 90 tablet 1   Multiple Vitamin (MULTIVITAMIN) tablet Take 1 tablet by mouth daily.     Multiple Vitamins-Minerals (VITAMIN D3 COMPLETE PO) Take by mouth.     Omega-3 Fatty Acids (FISH OIL) 1000 MG CAPS Take 2 capsules by mouth daily.     risperiDONE (RISPERDAL) 2 MG tablet Take 2 mg by mouth 2 (two) times daily.     rosuvastatin (CRESTOR) 10 MG tablet TAKE 1 TABLET BY MOUTH  DAILY 90 tablet 3   modafinil (PROVIGIL) 200 MG tablet TAKE ONE TABLET BY MOUTH ONE TIME DAILY  30 tablet 4   TRELEGY ELLIPTA 100-62.5-25 MCG/INH AEPB INHALE  1 PUFF INTO THE LUNGS DAILY 120 each 1   No facility-administered medications prior to visit.    ROS Review of Systems  Constitutional:  Negative for chills, diaphoresis, fatigue and fever.  HENT:  Negative for trouble swallowing.   Eyes: Negative.   Respiratory:  Negative for cough, chest tightness, shortness of breath and wheezing.   Cardiovascular:  Negative for chest pain, palpitations and leg swelling.  Gastrointestinal:  Negative for abdominal pain, blood in stool, constipation, diarrhea, nausea and vomiting.  Endocrine: Negative.   Genitourinary: Negative.  Negative for difficulty urinating, penile swelling, scrotal swelling and testicular pain.  Musculoskeletal:  Negative for arthralgias and myalgias.  Skin: Negative.   Neurological:  Negative for dizziness, weakness, light-headedness, numbness and headaches.  Hematological:  Negative for adenopathy. Does not bruise/bleed easily.  Psychiatric/Behavioral: Negative.     Objective:  BP 110/60 (BP Location: Left Arm, Patient Position: Sitting, Cuff Size: Large)   Pulse 66   Temp 97.9 F (36.6 C) (Oral)   Ht '5\' 9"'$  (1.753 m)   Wt 217 lb (98.4 kg)   SpO2 95%   BMI 32.05 kg/m   BP Readings from Last 3 Encounters:  03/04/21 110/60  09/24/20 136/70  09/12/20 122/74    Wt Readings from Last 3 Encounters:  03/04/21 217 lb (98.4 kg)  09/24/20 218 lb (98.9 kg)  09/12/20 220 lb (99.8 kg)    Physical Exam Vitals reviewed. Exam conducted with a chaperone present.  Constitutional:      Appearance: Normal appearance.  HENT:     Nose: Nose normal.     Mouth/Throat:     Mouth: Mucous membranes are moist.  Eyes:     General: No scleral icterus.    Conjunctiva/sclera: Conjunctivae normal.  Cardiovascular:     Rate and Rhythm: Normal rate and regular rhythm.     Heart sounds: No murmur heard. Pulmonary:     Effort: Pulmonary effort is normal.     Breath sounds: No stridor. No wheezing, rhonchi or rales.  Abdominal:      General: Abdomen is flat.     Palpations: There is no mass.     Tenderness: There is no abdominal tenderness. There is no guarding.     Hernia: No hernia is present. There is no hernia in the left inguinal area or right inguinal area.  Genitourinary:    Pubic Area: No rash.      Penis: Normal and circumcised.      Testes: Normal.     Epididymis:     Right: Normal.     Left: Normal.     Prostate: Enlarged. Not tender and no nodules present.     Rectum: Guaiac result positive. Internal hemorrhoid present. No mass, tenderness, anal fissure or external hemorrhoid. Normal anal tone.  Musculoskeletal:        General: Normal range of motion.     Cervical back: Neck supple.     Right lower leg: No edema.     Left lower leg: No edema.  Lymphadenopathy:     Cervical: No cervical adenopathy.     Lower Body: No right inguinal adenopathy. No left inguinal adenopathy.  Skin:    General: Skin is warm and dry.     Coloration: Skin is not pale.  Neurological:     General: No focal deficit present.     Mental Status: He is alert and oriented to person, place, and time. Mental status is at baseline.  Psychiatric:        Mood and Affect: Mood normal.        Behavior: Behavior normal.    Lab Results  Component Value Date   WBC 8.6 03/04/2021   HGB 14.1 03/04/2021   HCT 40.7 03/04/2021   PLT 145.0 (L) 03/04/2021   GLUCOSE 102 (H) 03/04/2021   CHOL 130 03/04/2021   TRIG 161.0 (H) 03/04/2021   HDL 46.70 03/04/2021   LDLDIRECT 59.0 12/02/2018   LDLCALC 51 03/04/2021   ALT 33 03/04/2021   AST 26 03/04/2021   NA 140 03/04/2021   K 4.3 03/04/2021   CL 103 03/04/2021   CREATININE 0.83 03/04/2021   BUN 16 03/04/2021   CO2 28 03/04/2021   TSH 1.57 03/04/2021   PSA 0.65 03/04/2021   INR 1.0 01/11/2020   HGBA1C 5.8 03/04/2021   MICROALBUR 5.7 (H) 12/02/2018    CT CHEST LUNG CA SCREEN LOW DOSE W/O CM  Result Date: 10/19/2020 CLINICAL DATA:  55 year old male with 31 pack-year history of  smoking. Lung cancer screening. EXAM: CT CHEST WITHOUT CONTRAST LOW-DOSE FOR LUNG CANCER SCREENING TECHNIQUE: Multidetector CT imaging of the chest was performed following the standard protocol without IV contrast. COMPARISON:  10/17/2019 FINDINGS: Cardiovascular: The heart size is normal. No substantial pericardial effusion. Coronary artery calcification is evident. Atherosclerotic calcification is noted in  the wall of the thoracic aorta. Mediastinum/Nodes: No mediastinal lymphadenopathy. No evidence for gross hilar lymphadenopathy although assessment is limited by the lack of intravenous contrast on today's study. The esophagus has normal imaging features. Upper normal axillary lymph nodes are seen bilaterally, similar to prior. Lungs/Pleura: Centrilobular emphsyema noted. Scattered tiny bilateral pulmonary nodules identified previously are stable in the interval. No new suspicious pulmonary nodule or mass. No focal airspace consolidation. No pleural effusion. Upper Abdomen: Unremarkable Musculoskeletal: No worrisome lytic or sclerotic osseous abnormality. IMPRESSION: 1. Lung-RADS 2, benign appearance or behavior. Continue annual screening with low-dose chest CT without contrast in 12 months. 2. Aortic Atherosclerosis (ICD10-I70.0) and Emphysema (ICD10-J43.9). Electronically Signed   By: Misty Stanley M.D.   On: 10/19/2020 08:10    Assessment & Plan:   Nicklos was seen today for annual exam.  Diagnoses and all orders for this visit:  Type II diabetes mellitus with manifestations ([place])- His blood sugar is well controlled. -     Basic metabolic panel; Future -     Hemoglobin A1c; Future -     Hemoglobin A1c -     Basic metabolic panel  Hyperlipidemia LDL goal <100- LDL goal achieved. Doing well on the statin  -     Lipid panel; Future -     Hepatic function panel; Future -     TSH; Future -     TSH -     Hepatic function panel -     Lipid panel  Thrombocytopenia ([place])- PLT remains mildly low. No  bleeding/bruising. Will follow. -     CBC with Differential/Platelet; Future -     CBC with Differential/Platelet  Nocturia associated with benign prostatic hyperplasia- His PSA is normal. -     PSA; Future -     PSA  Encounter for general adult medical examination with abnormal findings- Exam completed, labs reviewed, vaccines reviewed, cancer screenings addressed, pt ed material was given.  Occult blood in stools -     Ambulatory referral to Gastroenterology  Other orders -     Flu Vaccine QUAD 6+ mos PF IM (Fluarix Quad PF)  I am having Peggy M. Adella Nissen maintain his risperiDONE, lamoTRIgine, LORazepam, diclofenac, multivitamin, Fish Oil, MAGNESIUM-ZINC PO, Multiple Vitamins-Minerals (VITAMIN D3 COMPLETE PO), fluticasone, cyclobenzaprine, ProAir RespiClick, rosuvastatin, and montelukast.  No orders of the defined types were placed in this encounter.    Follow-up: Return in about 3 months (around 06/03/2021).  Scarlette Calico, MD

## 2021-03-05 LAB — CBC WITH DIFFERENTIAL/PLATELET
Basophils Absolute: 0.1 10*3/uL (ref 0.0–0.1)
Basophils Relative: 1.1 % (ref 0.0–3.0)
Eosinophils Absolute: 0.3 10*3/uL (ref 0.0–0.7)
Eosinophils Relative: 3.9 % (ref 0.0–5.0)
HCT: 40.7 % (ref 39.0–52.0)
Hemoglobin: 14.1 g/dL (ref 13.0–17.0)
Lymphocytes Relative: 29.8 % (ref 12.0–46.0)
Lymphs Abs: 2.6 10*3/uL (ref 0.7–4.0)
MCHC: 34.6 g/dL (ref 30.0–36.0)
MCV: 85.2 fl (ref 78.0–100.0)
Monocytes Absolute: 0.8 10*3/uL (ref 0.1–1.0)
Monocytes Relative: 9.7 % (ref 3.0–12.0)
Neutro Abs: 4.8 10*3/uL (ref 1.4–7.7)
Neutrophils Relative %: 55.5 % (ref 43.0–77.0)
Platelets: 145 10*3/uL — ABNORMAL LOW (ref 150.0–400.0)
RBC: 4.78 Mil/uL (ref 4.22–5.81)
RDW: 13.3 % (ref 11.5–15.5)
WBC: 8.6 10*3/uL (ref 4.0–10.5)

## 2021-03-05 LAB — LIPID PANEL
Cholesterol: 130 mg/dL (ref 0–200)
HDL: 46.7 mg/dL (ref >39.00)
LDL Cholesterol: 51 mg/dL (ref 0–99)
NonHDL: 83.2
Total CHOL/HDL Ratio: 3
Triglycerides: 161 mg/dL — ABNORMAL HIGH (ref 0.0–149.0)
VLDL: 32.2 mg/dL (ref 0.0–40.0)

## 2021-03-05 LAB — BASIC METABOLIC PANEL
BUN: 16 mg/dL (ref 6–23)
CO2: 28 mEq/L (ref 19–32)
Calcium: 10.3 mg/dL (ref 8.4–10.5)
Chloride: 103 mEq/L (ref 96–112)
Creatinine, Ser: 0.83 mg/dL (ref 0.40–1.50)
GFR: 97.84 mL/min (ref >60.00)
Glucose, Bld: 102 mg/dL — ABNORMAL HIGH (ref 70–99)
Potassium: 4.3 mEq/L (ref 3.5–5.1)
Sodium: 140 mEq/L (ref 135–145)

## 2021-03-05 LAB — HEPATIC FUNCTION PANEL
ALT: 33 U/L (ref 0–53)
AST: 26 U/L (ref 0–37)
Albumin: 4.8 g/dL (ref 3.5–5.2)
Alkaline Phosphatase: 57 U/L (ref 39–117)
Bilirubin, Direct: 0.2 mg/dL (ref 0.0–0.3)
Total Bilirubin: 0.9 mg/dL (ref 0.2–1.2)
Total Protein: 7.5 g/dL (ref 6.0–8.3)

## 2021-03-05 LAB — PSA: PSA: 0.65 ng/mL (ref 0.10–4.00)

## 2021-03-05 LAB — HEMOGLOBIN A1C: Hgb A1c MFr Bld: 5.8 % (ref 4.6–6.5)

## 2021-03-05 LAB — TSH: TSH: 1.57 u[IU]/mL (ref 0.35–5.50)

## 2021-03-08 ENCOUNTER — Other Ambulatory Visit: Payer: Self-pay | Admitting: Internal Medicine

## 2021-03-08 DIAGNOSIS — G4710 Hypersomnia, unspecified: Secondary | ICD-10-CM

## 2021-03-09 ENCOUNTER — Other Ambulatory Visit: Payer: Self-pay | Admitting: Internal Medicine

## 2021-03-09 DIAGNOSIS — J449 Chronic obstructive pulmonary disease, unspecified: Secondary | ICD-10-CM

## 2021-03-11 DIAGNOSIS — G4733 Obstructive sleep apnea (adult) (pediatric): Secondary | ICD-10-CM | POA: Diagnosis not present

## 2021-03-13 ENCOUNTER — Encounter: Payer: Self-pay | Admitting: Internal Medicine

## 2021-03-13 ENCOUNTER — Other Ambulatory Visit: Payer: Self-pay | Admitting: Internal Medicine

## 2021-03-13 DIAGNOSIS — F411 Generalized anxiety disorder: Secondary | ICD-10-CM

## 2021-03-13 DIAGNOSIS — G4730 Sleep apnea, unspecified: Secondary | ICD-10-CM

## 2021-03-13 DIAGNOSIS — M4802 Spinal stenosis, cervical region: Secondary | ICD-10-CM

## 2021-03-13 DIAGNOSIS — M19071 Primary osteoarthritis, right ankle and foot: Secondary | ICD-10-CM

## 2021-03-13 DIAGNOSIS — G4710 Hypersomnia, unspecified: Secondary | ICD-10-CM

## 2021-03-13 MED ORDER — LORAZEPAM 1 MG PO TABS: ORAL_TABLET | ORAL | 3 refills | Status: DC

## 2021-03-13 MED ORDER — CYCLOBENZAPRINE HCL 10 MG PO TABS: 10.0000 mg | ORAL_TABLET | Freq: Three times a day (TID) | ORAL | 2 refills | Status: DC | PRN

## 2021-03-14 ENCOUNTER — Other Ambulatory Visit: Payer: Self-pay | Admitting: Internal Medicine

## 2021-03-14 DIAGNOSIS — G4730 Sleep apnea, unspecified: Secondary | ICD-10-CM

## 2021-03-14 DIAGNOSIS — G4710 Hypersomnia, unspecified: Secondary | ICD-10-CM

## 2021-03-18 ENCOUNTER — Other Ambulatory Visit: Payer: Self-pay | Admitting: Internal Medicine

## 2021-03-18 ENCOUNTER — Telehealth: Payer: Self-pay | Admitting: Internal Medicine

## 2021-03-18 DIAGNOSIS — M4802 Spinal stenosis, cervical region: Secondary | ICD-10-CM

## 2021-03-18 NOTE — Telephone Encounter (Signed)
Patient calling back following up from morning call  Please call patient back w/ update when available

## 2021-03-18 NOTE — Telephone Encounter (Signed)
Patient calling in complaining of severe pain going from neck to arm.. patient scheduled appt w/ provider but he says he can not wait until Nov to be seen  Wants to know if provider can send PA to [facility] Dr. Weston Settle & try to be seen before then  Please call patient [phone]

## 2021-03-25 ENCOUNTER — Ambulatory Visit (INDEPENDENT_AMBULATORY_CARE_PROVIDER_SITE_OTHER): Payer: Medicare Other | Admitting: Internal Medicine

## 2021-03-25 ENCOUNTER — Other Ambulatory Visit: Payer: Self-pay

## 2021-03-25 ENCOUNTER — Encounter: Payer: Self-pay | Admitting: Internal Medicine

## 2021-03-25 ENCOUNTER — Ambulatory Visit (INDEPENDENT_AMBULATORY_CARE_PROVIDER_SITE_OTHER): Payer: Medicare Other

## 2021-03-25 VITALS — BP 118/70 | HR 85 | Temp 98.5°F | Ht 69.0 in | Wt 214.0 lb

## 2021-03-25 DIAGNOSIS — M542 Cervicalgia: Secondary | ICD-10-CM | POA: Diagnosis not present

## 2021-03-25 DIAGNOSIS — M5412 Radiculopathy, cervical region: Secondary | ICD-10-CM

## 2021-03-25 DIAGNOSIS — Z79891 Long term (current) use of opiate analgesic: Secondary | ICD-10-CM

## 2021-03-25 MED ORDER — HYDROCODONE-ACETAMINOPHEN 10-325 MG PO TABS
1.0000 | ORAL_TABLET | Freq: Four times a day (QID) | ORAL | 0 refills | Status: DC | PRN
Start: 2021-03-25 — End: 2021-03-25

## 2021-03-25 MED ORDER — METHYLPREDNISOLONE 4 MG PO TBPK: ORAL_TABLET | ORAL | 0 refills | Status: AC

## 2021-03-25 MED ORDER — HYDROCODONE-ACETAMINOPHEN 10-325 MG PO TABS: 1.0000 | ORAL_TABLET | Freq: Four times a day (QID) | ORAL | 0 refills | Status: AC | PRN

## 2021-03-25 NOTE — Progress Notes (Signed)
D74.128   Subjective:  Patient ID: Jeff Norton, male    DOB: 1966-03-30  Age: 55 y.o. MRN: [id number]  CC: Neck Pain  This visit occurred during the SARS-CoV-2 public health emergency.  Safety protocols were in place, including screening questions prior to the visit, additional usage of staff PPE, and extensive cleaning of exam room while observing appropriate contact time as indicated for disinfecting solutions.    HPI Jeff Norton presents for f/up -  Jeff Norton complains of a 1 week history of right-sided neck pain that radiates towards his right shoulder.  Jeff Norton has numbness and weakness in his right hand.  Jeff Norton is status post C5-6 ACDF.  Jeff Norton denies trauma or injury and has not gotten much relief from the pain with diclofenac.  Unfortunately Jeff Norton has been drinking alcohol to try to control the  Outpatient Medications Prior to Visit  Medication Sig Dispense Refill   Albuterol Sulfate (PROAIR RESPICLICK) 470 (90 Base) MCG/ACT AEPB Inhale 1 puff into the lungs 3 (three) times daily as needed. 1 each 5   cyclobenzaprine (FLEXERIL) 10 MG tablet Take 1 tablet (10 mg total) by mouth 3 (three) times daily as needed for muscle spasms. 45 tablet 2   diclofenac (VOLTAREN) 75 MG EC tablet TAKE 1 TABLET BY MOUTH  TWICE DAILY 180 tablet 0   fluticasone (FLONASE) 50 MCG/ACT nasal spray Place 2 sprays into both nostrils daily. 48 g 1   lamoTRIgine (LAMICTAL) 200 MG tablet Take 1 tablet (200 mg total) by mouth 2 (two) times daily. 180 tablet 1   LORazepam (ATIVAN) 1 MG tablet TAKE 1 TABLET(1 MG) BY MOUTH TWICE DAILY AS NEEDED FOR ANXIETY 60 tablet 3   MAGNESIUM-ZINC PO Take by mouth.     modafinil (PROVIGIL) 200 MG tablet TAKE ONE TABLET BY MOUTH ONE TIME DAILY 90 tablet 0   montelukast (SINGULAIR) 10 MG tablet TAKE 1 TABLET BY MOUTH AT  BEDTIME 90 tablet 1   Multiple Vitamin (MULTIVITAMIN) tablet Take 1 tablet by mouth daily.     Multiple Vitamins-Minerals (VITAMIN D3 COMPLETE PO) Take by mouth.     Omega-3  Fatty Acids (FISH OIL) 1000 MG CAPS Take 2 capsules by mouth daily.     risperiDONE (RISPERDAL) 2 MG tablet Take 2 mg by mouth 2 (two) times daily.     rosuvastatin (CRESTOR) 10 MG tablet TAKE 1 TABLET BY MOUTH  DAILY 90 tablet 3   TRELEGY ELLIPTA 100-62.5-25 MCG/INH AEPB INHALE 1 PUFF INTO THE LUNGS DAILY 120 each 1   No facility-administered medications prior to visit.    ROS Review of Systems  Constitutional:  Negative for chills, diaphoresis, fatigue and fever.  HENT: Negative.    Eyes: Negative.   Respiratory: Negative.  Negative for cough, chest tightness, shortness of breath and wheezing.   Cardiovascular:  Negative for chest pain, palpitations and leg swelling.  Gastrointestinal:  Negative for abdominal pain.  Endocrine: Negative.   Genitourinary: Negative.  Negative for difficulty urinating.  Musculoskeletal:  Positive for neck pain.  Skin: Negative.   Neurological:  Positive for weakness and numbness.  Hematological:  Negative for adenopathy. Does not bruise/bleed easily.  Psychiatric/Behavioral: Negative.     Objective:  BP 118/70 (BP Location: Left Arm, Patient Position: Sitting, Cuff Size: Large)   Pulse 85   Temp 98.5 F (36.9 C) (Oral)   Ht 5\' 9"  (1.753 m)   Wt 214 lb (97.1 kg)   SpO2 93%   BMI 31.60 kg/m   BP  Readings from Last 3 Encounters:  03/25/21 118/70  03/04/21 110/60  09/24/20 136/70    Wt Readings from Last 3 Encounters:  03/25/21 214 lb (97.1 kg)  03/04/21 217 lb (98.4 kg)  09/24/20 218 lb (98.9 kg)    Physical Exam Vitals reviewed.  Constitutional:      Appearance: Normal appearance.  HENT:     Nose: Nose normal.     Mouth/Throat:     Mouth: Mucous membranes are moist.  Eyes:     General: No scleral icterus.    Conjunctiva/sclera: Conjunctivae normal.  Cardiovascular:     Rate and Rhythm: Normal rate and regular rhythm.     Heart sounds: No murmur heard. Pulmonary:     Effort: Pulmonary effort is normal.     Breath sounds: No  stridor. No wheezing, rhonchi or rales.  Abdominal:     General: Abdomen is flat.     Palpations: There is no mass.     Tenderness: There is no abdominal tenderness. There is no guarding.     Hernia: No hernia is present.  Musculoskeletal:        General: Normal range of motion.     Cervical back: Normal range of motion and neck supple. No edema, erythema or signs of trauma. Normal range of motion.     Right lower leg: No edema.     Left lower leg: No edema.  Lymphadenopathy:     Cervical: No cervical adenopathy.  Skin:    General: Skin is dry.  Neurological:     General: No focal deficit present.     Mental Status: Jeff Norton is alert. Mental status is at baseline.     Motor: No weakness, tremor, abnormal muscle tone or seizure activity.     Coordination: Coordination is intact.     Deep Tendon Reflexes: Reflexes normal.     Reflex Scores:      Tricep reflexes are 0 on the right side and 0 on the left side.      Bicep reflexes are 0 on the right side and 0 on the left side.      Brachioradialis reflexes are 0 on the right side and 0 on the left side.      Patellar reflexes are 0 on the right side and 0 on the left side.      Achilles reflexes are 0 on the right side and 0 on the left side.   Lab Results  Component Value Date   WBC 8.6 03/04/2021   HGB 14.1 03/04/2021   HCT 40.7 03/04/2021   PLT 145.0 (L) 03/04/2021   GLUCOSE 102 (H) 03/04/2021   CHOL 130 03/04/2021   TRIG 161.0 (H) 03/04/2021   HDL 46.70 03/04/2021   LDLDIRECT 59.0 12/02/2018   LDLCALC 51 03/04/2021   ALT 33 03/04/2021   AST 26 03/04/2021   NA 140 03/04/2021   K 4.3 03/04/2021   CL 103 03/04/2021   CREATININE 0.83 03/04/2021   BUN 16 03/04/2021   CO2 28 03/04/2021   TSH 1.57 03/04/2021   PSA 0.65 03/04/2021   INR 1.0 01/11/2020   HGBA1C 5.8 03/04/2021   MICROALBUR 5.7 (H) 12/02/2018    CT CHEST LUNG CA SCREEN LOW DOSE W/O CM  Result Date: 10/19/2020 CLINICAL DATA:  55 year old male with 68 pack-year  history of smoking. Lung cancer screening. EXAM: CT CHEST WITHOUT CONTRAST LOW-DOSE FOR LUNG CANCER SCREENING TECHNIQUE: Multidetector CT imaging of the chest was performed following the standard protocol without  IV contrast. COMPARISON:  10/17/2019 FINDINGS: Cardiovascular: The heart size is normal. No substantial pericardial effusion. Coronary artery calcification is evident. Atherosclerotic calcification is noted in the wall of the thoracic aorta. Mediastinum/Nodes: No mediastinal lymphadenopathy. No evidence for gross hilar lymphadenopathy although assessment is limited by the lack of intravenous contrast on today's study. The esophagus has normal imaging features. Upper normal axillary lymph nodes are seen bilaterally, similar to prior. Lungs/Pleura: Centrilobular emphsyema noted. Scattered tiny bilateral pulmonary nodules identified previously are stable in the interval. No new suspicious pulmonary nodule or mass. No focal airspace consolidation. No pleural effusion. Upper Abdomen: Unremarkable Musculoskeletal: No worrisome lytic or sclerotic osseous abnormality. IMPRESSION: 1. Lung-RADS 2, benign appearance or behavior. Continue annual screening with low-dose chest CT without contrast in 12 months. 2. Aortic Atherosclerosis (ICD10-I70.0) and Emphysema (ICD10-J43.9). Electronically Signed   By: Misty Stanley M.D.   On: 10/19/2020 08:10   DG Cervical Spine Complete  Result Date: 03/25/2021 CLINICAL DATA:  Chronic posterior neck pain, right-sided radiculopathy EXAM: CERVICAL SPINE - COMPLETE 4+ VIEW COMPARISON:  03/13/2017 FINDINGS: Frontal, bilateral oblique, lateral views of the cervical spine are obtained. Alignment is anatomic to the cervicothoracic junction. Stable C5-6 ACDF. Bony fusion across the C6-7 disc space unchanged. Mild spondylosis at C4-5. Neural foramina appear widely patent. Prevertebral soft tissues are unremarkable. Lung apices are clear. IMPRESSION: 1. Stable C5-6 ACDF. 2. Mild  spondylosis at C4-5, which has progressed since prior study. 3. No acute bony abnormality. Electronically Signed   By: Randa Ngo M.D.   On: 03/25/2021 17:02     Assessment & Plan:   Jeff Norton was seen today for neck pain.  Diagnoses and all orders for this visit:  Radiculitis of right cervical region- The C5/C6 ACDF is stable but the film shows some worsening of the spondylosis.  Jeff Norton will keep his appointment later this week with neurosurgery.  In the meantime, I will treat the pain and inflammation with methylprednisolone, hydrocodone, and acetaminophen.  Jeff Norton is neurologically intact. -     DG Cervical Spine Complete; Future -     methylPREDNISolone (MEDROL DOSEPAK) 4 MG TBPK tablet; TAKE AS DIRECTED -     Discontinue: HYDROcodone-acetaminophen (NORCO) 10-325 MG tablet; Take 1 tablet by mouth every 6 (six) hours as needed for up to 5 days. -     HYDROcodone-acetaminophen (NORCO) 10-325 MG tablet; Take 1 tablet by mouth every 6 (six) hours as needed for up to 5 days.  Long term current use of opiate analgesic -     naloxone (NARCAN) nasal spray 4 mg/0.1 mL; Place 1 spray into the nose once for 1 dose.  I am having Dante Roudebush. Adella Nissen start on methylPREDNISolone and naloxone. I am also having him maintain his risperiDONE, lamoTRIgine, diclofenac, multivitamin, Fish Oil, MAGNESIUM-ZINC PO, Multiple Vitamins-Minerals (VITAMIN D3 COMPLETE PO), fluticasone, ProAir RespiClick, rosuvastatin, montelukast, Trelegy Ellipta, LORazepam, cyclobenzaprine, modafinil, and HYDROcodone-acetaminophen.  Meds ordered this encounter  Medications   methylPREDNISolone (MEDROL DOSEPAK) 4 MG TBPK tablet    Sig: TAKE AS DIRECTED    Dispense:  21 tablet    Refill:  0   DISCONTD: HYDROcodone-acetaminophen (NORCO) 10-325 MG tablet    Sig: Take 1 tablet by mouth every 6 (six) hours as needed for up to 5 days.    Dispense:  25 tablet    Refill:  0   HYDROcodone-acetaminophen (NORCO) 10-325 MG tablet    Sig: Take 1 tablet by  mouth every 6 (six) hours as needed for up to 5  days.    Dispense:  25 tablet    Refill:  0   naloxone (NARCAN) nasal spray 4 mg/0.1 mL    Sig: Place 1 spray into the nose once for 1 dose.    Dispense:  2 each    Refill:  2      Follow-up: No follow-ups on file.  Scarlette Calico, MD

## 2021-03-26 DIAGNOSIS — M4722 Other spondylosis with radiculopathy, cervical region: Secondary | ICD-10-CM | POA: Diagnosis not present

## 2021-03-26 DIAGNOSIS — Z79891 Long term (current) use of opiate analgesic: Secondary | ICD-10-CM | POA: Insufficient documentation

## 2021-03-26 MED ORDER — NALOXONE HCL 4 MG/0.1ML NA LIQD: 1.0000 | Freq: Once | NASAL | 2 refills | Status: AC

## 2021-04-03 DIAGNOSIS — M4722 Other spondylosis with radiculopathy, cervical region: Secondary | ICD-10-CM | POA: Diagnosis not present

## 2021-04-03 DIAGNOSIS — M542 Cervicalgia: Secondary | ICD-10-CM | POA: Diagnosis not present

## 2021-04-10 DIAGNOSIS — Z86010 Personal history of colonic polyps: Secondary | ICD-10-CM | POA: Diagnosis not present

## 2021-04-10 DIAGNOSIS — R195 Other fecal abnormalities: Secondary | ICD-10-CM | POA: Diagnosis not present

## 2021-04-12 ENCOUNTER — Other Ambulatory Visit: Payer: Self-pay | Admitting: Internal Medicine

## 2021-04-12 DIAGNOSIS — M19071 Primary osteoarthritis, right ankle and foot: Secondary | ICD-10-CM

## 2021-04-12 DIAGNOSIS — M4802 Spinal stenosis, cervical region: Secondary | ICD-10-CM

## 2021-04-16 ENCOUNTER — Other Ambulatory Visit: Payer: Self-pay | Admitting: Internal Medicine

## 2021-04-16 DIAGNOSIS — M19071 Primary osteoarthritis, right ankle and foot: Secondary | ICD-10-CM

## 2021-04-16 DIAGNOSIS — M4802 Spinal stenosis, cervical region: Secondary | ICD-10-CM

## 2021-04-17 ENCOUNTER — Other Ambulatory Visit: Payer: Self-pay | Admitting: Internal Medicine

## 2021-04-17 DIAGNOSIS — M19071 Primary osteoarthritis, right ankle and foot: Secondary | ICD-10-CM

## 2021-04-17 DIAGNOSIS — M4802 Spinal stenosis, cervical region: Secondary | ICD-10-CM

## 2021-04-17 DIAGNOSIS — J301 Allergic rhinitis due to pollen: Secondary | ICD-10-CM

## 2021-04-18 ENCOUNTER — Ambulatory Visit: Payer: Medicare Other | Admitting: Internal Medicine

## 2021-04-18 DIAGNOSIS — M542 Cervicalgia: Secondary | ICD-10-CM | POA: Diagnosis not present

## 2021-04-23 ENCOUNTER — Encounter: Payer: Self-pay | Admitting: Internal Medicine

## 2021-04-30 DIAGNOSIS — M6281 Muscle weakness (generalized): Secondary | ICD-10-CM | POA: Diagnosis not present

## 2021-04-30 DIAGNOSIS — M5013 Cervical disc disorder with radiculopathy, cervicothoracic region: Secondary | ICD-10-CM | POA: Diagnosis not present

## 2021-04-30 DIAGNOSIS — M2560 Stiffness of unspecified joint, not elsewhere classified: Secondary | ICD-10-CM | POA: Diagnosis not present

## 2021-05-08 DIAGNOSIS — M5013 Cervical disc disorder with radiculopathy, cervicothoracic region: Secondary | ICD-10-CM | POA: Diagnosis not present

## 2021-05-08 DIAGNOSIS — M2560 Stiffness of unspecified joint, not elsewhere classified: Secondary | ICD-10-CM | POA: Diagnosis not present

## 2021-05-08 DIAGNOSIS — M6281 Muscle weakness (generalized): Secondary | ICD-10-CM | POA: Diagnosis not present

## 2021-05-14 DIAGNOSIS — M2560 Stiffness of unspecified joint, not elsewhere classified: Secondary | ICD-10-CM | POA: Diagnosis not present

## 2021-05-14 DIAGNOSIS — M5013 Cervical disc disorder with radiculopathy, cervicothoracic region: Secondary | ICD-10-CM | POA: Diagnosis not present

## 2021-05-14 DIAGNOSIS — M6281 Muscle weakness (generalized): Secondary | ICD-10-CM | POA: Diagnosis not present

## 2021-05-16 DIAGNOSIS — M2560 Stiffness of unspecified joint, not elsewhere classified: Secondary | ICD-10-CM | POA: Diagnosis not present

## 2021-05-16 DIAGNOSIS — M6281 Muscle weakness (generalized): Secondary | ICD-10-CM | POA: Diagnosis not present

## 2021-05-16 DIAGNOSIS — M5013 Cervical disc disorder with radiculopathy, cervicothoracic region: Secondary | ICD-10-CM | POA: Diagnosis not present

## 2021-05-21 DIAGNOSIS — M6281 Muscle weakness (generalized): Secondary | ICD-10-CM | POA: Diagnosis not present

## 2021-05-21 DIAGNOSIS — M5013 Cervical disc disorder with radiculopathy, cervicothoracic region: Secondary | ICD-10-CM | POA: Diagnosis not present

## 2021-05-21 DIAGNOSIS — M2560 Stiffness of unspecified joint, not elsewhere classified: Secondary | ICD-10-CM | POA: Diagnosis not present

## 2021-05-23 DIAGNOSIS — M5013 Cervical disc disorder with radiculopathy, cervicothoracic region: Secondary | ICD-10-CM | POA: Diagnosis not present

## 2021-05-23 DIAGNOSIS — M6281 Muscle weakness (generalized): Secondary | ICD-10-CM | POA: Diagnosis not present

## 2021-05-23 DIAGNOSIS — M2560 Stiffness of unspecified joint, not elsewhere classified: Secondary | ICD-10-CM | POA: Diagnosis not present

## 2021-06-02 IMAGING — CT CT CHEST LUNG CANCER SCREENING LOW DOSE W/O CM
2 of 4 series · 15 of 36 positions shown, 18 images · non-contrast
Comparison: 10/17/2019

CLINICAL DATA: 55-year-old male with 43 pack-year history of
smoking. Lung cancer screening.

EXAM:
CT CHEST WITHOUT CONTRAST LOW-DOSE FOR LUNG CANCER SCREENING
TECHNIQUE: Multidetector CT imaging of the chest was performed following the
standard protocol without IV contrast.

[Series 3: lung thins 1.0 · axial · 0.76mm/px · z∈[-343,-51]mm · 12 of 322 slices shown, 15 images]
[im 15/322  mediastinal]
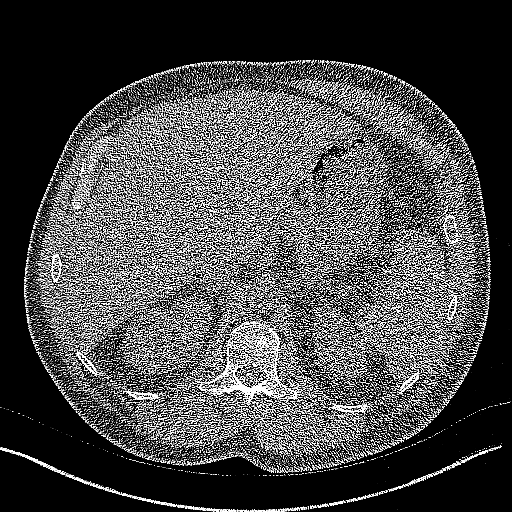
[im 15/322  lung]
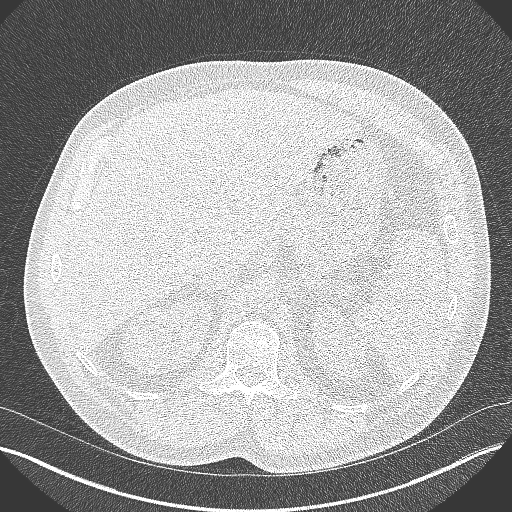
[im 44/322  lung]
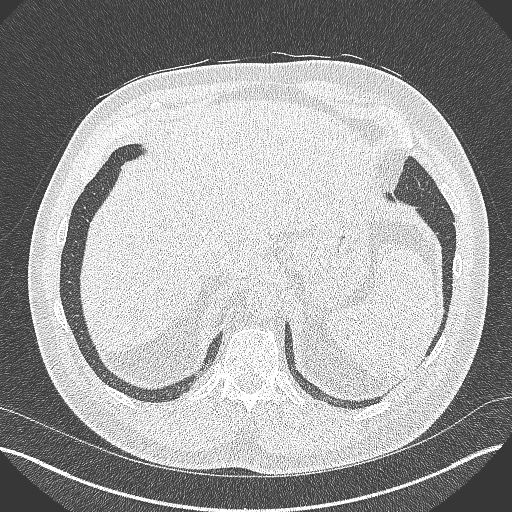
[im 73/322  lung]
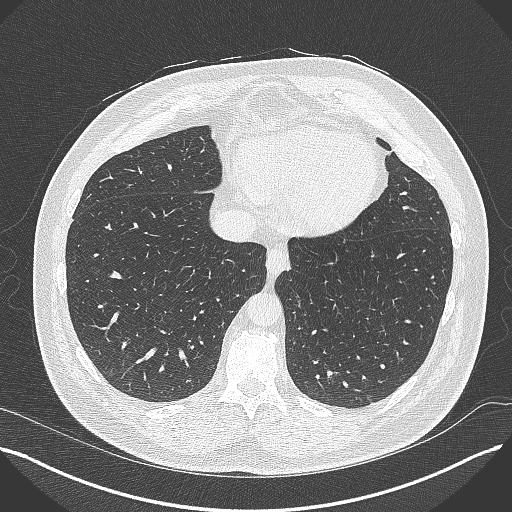
[im 103/322  lung]
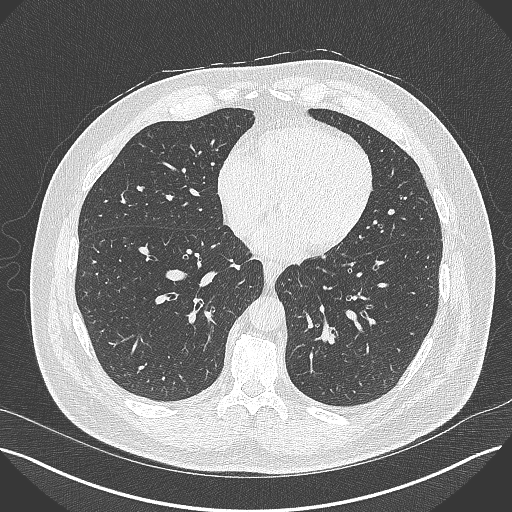
[im 117/322  mediastinal]
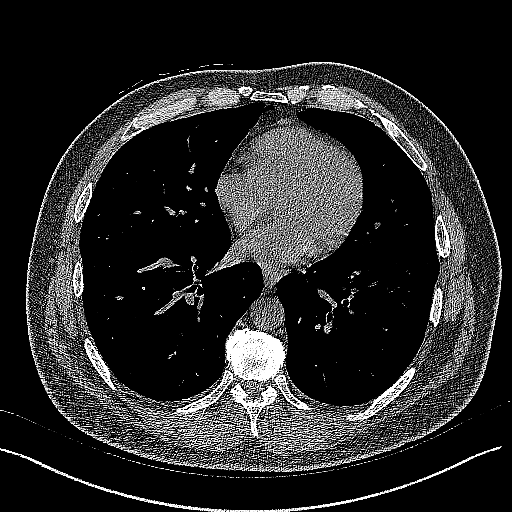
[im 117/322  lung]
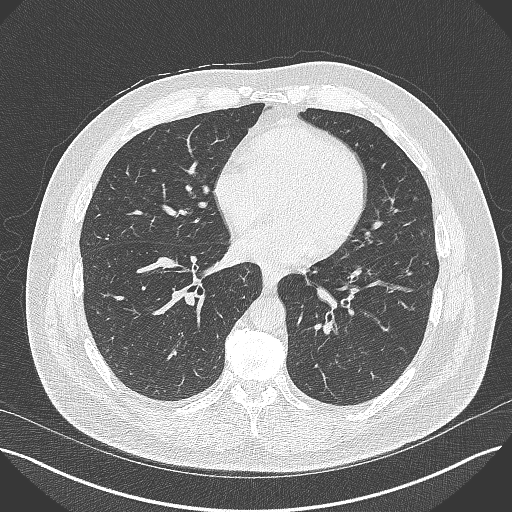
[im 146/322  lung]
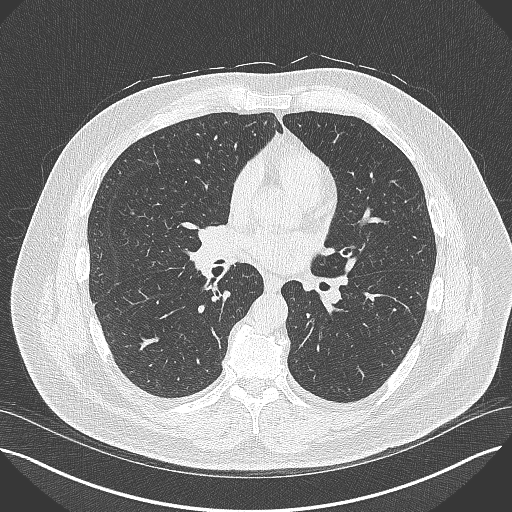
[im 176/322  lung]
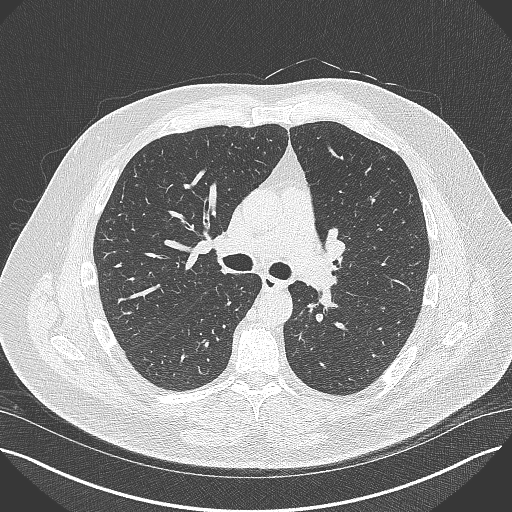
[im 205/322  lung]
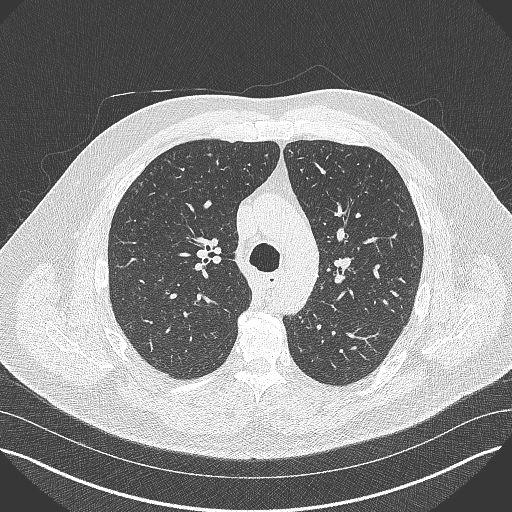
[im 219/322  mediastinal]
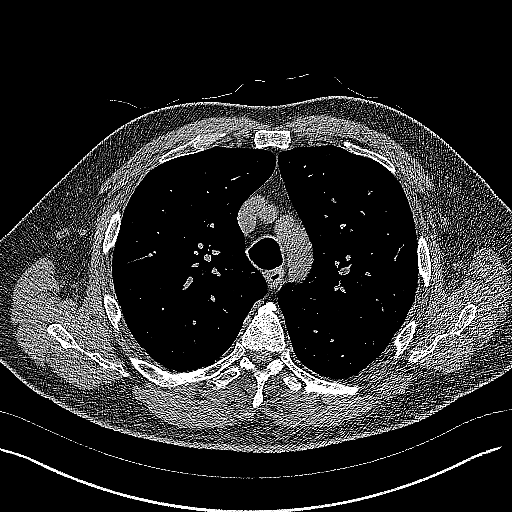
[im 219/322  lung]
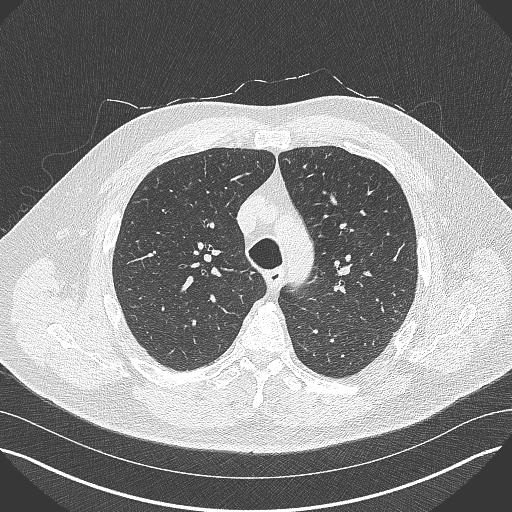
[im 249/322  lung]
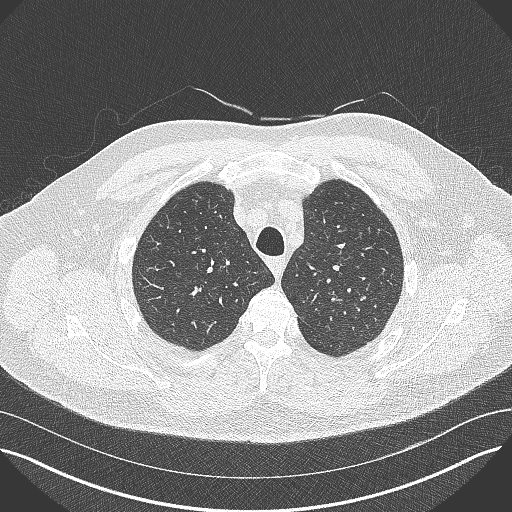
[im 278/322  lung]
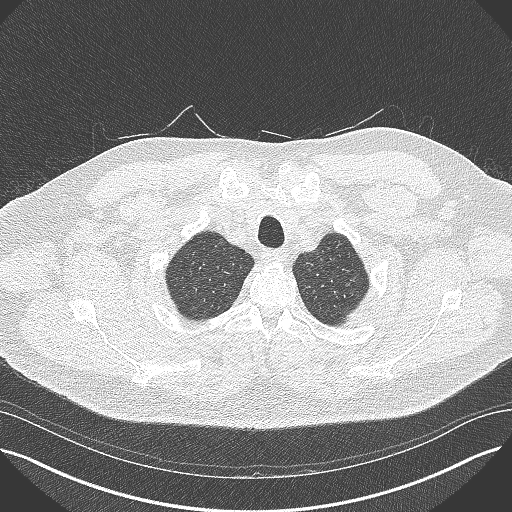
[im 307/322  lung]
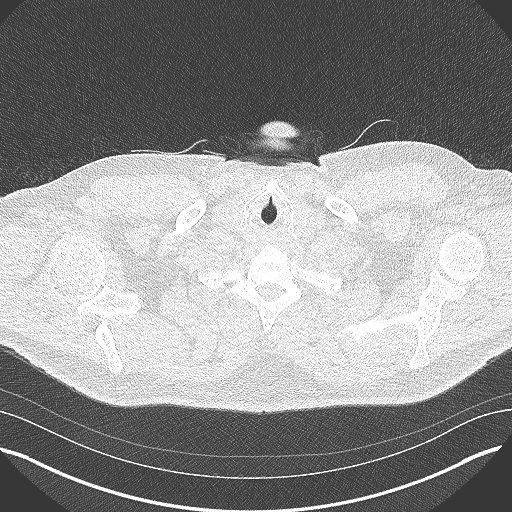

[Series 5: coronal · coronal · 0.63mm/px · 3 of 138 slices shown]
[im 28/138  lung]
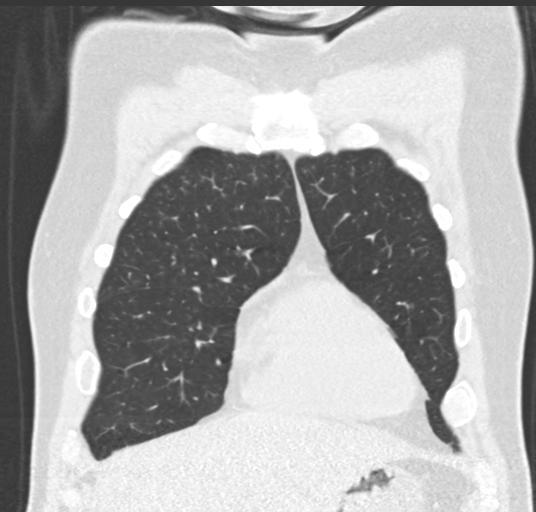
[im 55/138  lung]
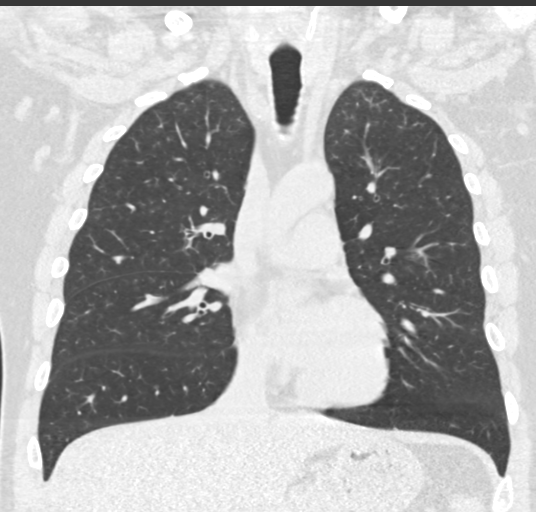
[im 83/138  lung]
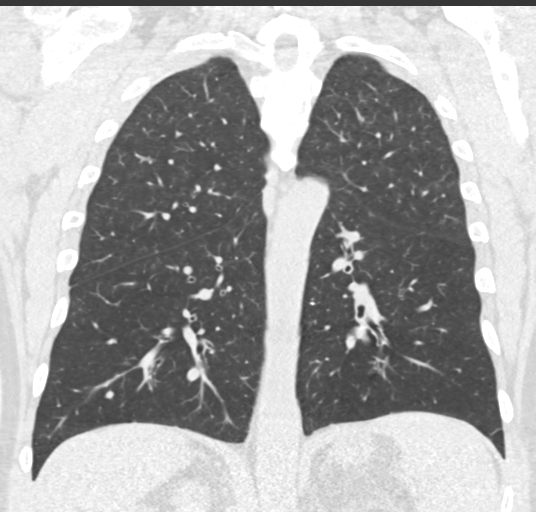

[15 of 36 positions shown; findings below may reference images not displayed]

FINDINGS: Cardiovascular: The heart size is normal. No substantial pericardial
effusion. Coronary artery calcification is evident. Atherosclerotic
calcification is noted in the wall of the thoracic aorta.

Mediastinum/Nodes: No mediastinal lymphadenopathy. No evidence for
gross hilar lymphadenopathy although assessment is limited by the
lack of intravenous contrast on today's study. The esophagus has
normal imaging features. Upper normal axillary lymph nodes are seen
bilaterally, similar to prior.

Lungs/Pleura: Centrilobular emphsyema noted. Scattered tiny
bilateral pulmonary nodules identified previously are stable in the
interval. No new suspicious pulmonary nodule or mass. No focal
airspace consolidation. No pleural effusion.

Upper Abdomen: Unremarkable

Musculoskeletal: No worrisome lytic or sclerotic osseous
abnormality.
IMPRESSION: 1. Lung-RADS 2, benign appearance or behavior. Continue annual
screening with low-dose chest CT without contrast in 12 months.
2. Aortic Atherosclerosis ([id number]-ZBO.O) and Emphysema (KV8X0-CSA.2).

## 2021-06-05 ENCOUNTER — Telehealth: Payer: Self-pay

## 2021-06-05 NOTE — Progress Notes (Signed)
° ° °  Chronic Care Management Pharmacy Assistant   Name: Jeff Norton  MRN: [id number] DOB: 1966/01/08  Reason for Encounter: Disease State - COPD Appointment: Telephone 08/19/2021 @ 11 am  Recent office visits:  03/25/21 [name] (PCP) - Radiculitis of right cervical region. Start Hydrocodone 10-325 mg, Medrol 4 mg & Naloxone 4 mg.  03/04/21 [name] (PCP) - Annual Exam. No med changes. Referral to [facility].  Recent consult visits:  04/18/21 Saintclair Halsted (Neurosurgery) - Cervical spondylosis with radiculopathy.  03/26/21 Cram (Neurosurgery) - Cervical spondylosis with radiculopathy.  02/01/21 Whitworth (Dermatology) - Mucocele of salivary gland.  01/25/21 [facility] (Ophthalmology) - Type 2 diabetes mellitus without complications.  Hospital visits:  None in previous 6 months  Medications: Outpatient Encounter Medications as of 06/05/2021  Medication Sig   Albuterol Sulfate (PROAIR RESPICLICK) 891 (90 Base) MCG/ACT AEPB Inhale 1 puff into the lungs 3 (three) times daily as needed.   cyclobenzaprine (FLEXERIL) 10 MG tablet Take 1 tablet (10 mg total) by mouth 3 (three) times daily as needed for muscle spasms.   diclofenac (VOLTAREN) 75 MG EC tablet TAKE 1 TABLET BY MOUTH TWICE  DAILY   fluticasone (FLONASE) 50 MCG/ACT nasal spray Place 2 sprays into both nostrils daily.   lamoTRIgine (LAMICTAL) 200 MG tablet Take 1 tablet (200 mg total) by mouth 2 (two) times daily.   LORazepam (ATIVAN) 1 MG tablet TAKE 1 TABLET(1 MG) BY MOUTH TWICE DAILY AS NEEDED FOR ANXIETY   MAGNESIUM-ZINC PO Take by mouth.   modafinil (PROVIGIL) 200 MG tablet TAKE ONE TABLET BY MOUTH ONE TIME DAILY   montelukast (SINGULAIR) 10 MG tablet TAKE 1 TABLET BY MOUTH AT  BEDTIME   Multiple Vitamin (MULTIVITAMIN) tablet Take 1 tablet by mouth daily.   Multiple Vitamins-Minerals (VITAMIN D3 COMPLETE PO) Take by mouth.   Omega-3 Fatty Acids (FISH OIL) 1000 MG CAPS Take 2 capsules by mouth daily.   risperiDONE (RISPERDAL) 2 MG tablet Take 2 mg  by mouth 2 (two) times daily.   rosuvastatin (CRESTOR) 10 MG tablet TAKE 1 TABLET BY MOUTH  DAILY   TRELEGY ELLIPTA 100-62.5-25 MCG/INH AEPB INHALE 1 PUFF INTO THE LUNGS DAILY   No facility-administered encounter medications on file as of 06/05/2021.   Current COPD regimen:  Current COPD regimen: Patient is taking Proair respiclick 694 mcg/act 2 puff QID prn Montelukast 10 mg Trelegy Ellipta 1 puff  daily  Reviewed chart for medication changes and drug therapy problems ahead of medication adherence call.  Attempted to contact patient for medication review and health check, unable to reach patient, left voicemails to return call.    Care Gaps Colonoscopy - 09/18/2015 Diabetic Foot Exam - 12/02/2018 Mammogram - NA Ophthalmology - 01/25/2021 Dexa Scan - NA Annual Well Visit - 03/04/21 Micro albumin - 12/02/2018 Hemoglobin A1c - 03/04/2021  Star Rating Drugs: Rosuvastatin - filled 01/24/21 [place], [place] Clinical Pharmacists Assistant [phone]

## 2021-06-27 ENCOUNTER — Telehealth: Payer: Self-pay

## 2021-06-27 NOTE — Progress Notes (Signed)
Chronic Care Management Pharmacy Assistant   Name: Jeff Norton  MRN: [id number] DOB: 11-30-1965   Reason for Encounter: Disease State-COPD  Appointment: Telephone 08/19/2021 @ 11 am  Recent office visits:  03/25/21 [name] (PCP) - Radiculitis of right cervical region. Start Hydrocodone 10-325 mg, Medrol 4 mg & Naloxone 4 mg.   03/04/21 [name] (PCP) - Annual Exam. No med changes. Referral to [facility].   Recent consult visits:  04/18/21 Saintclair Halsted (Neurosurgery) - Cervical spondylosis with radiculopathy.   03/26/21 Cram (Neurosurgery) - Cervical spondylosis with radiculopathy.   02/01/21 [facility] (Dermatology) - Mucocele of salivary gland.   01/25/21 Bowen (Ophthalmology) - Type 2 diabetes mellitus without complications  Hospital visits:  None in previous 6 months  Medications: Outpatient Encounter Medications as of 06/27/2021  Medication Sig   Albuterol Sulfate (PROAIR RESPICLICK) 258 (90 Base) MCG/ACT AEPB Inhale 1 puff into the lungs 3 (three) times daily as needed.   cyclobenzaprine (FLEXERIL) 10 MG tablet Take 1 tablet (10 mg total) by mouth 3 (three) times daily as needed for muscle spasms.   diclofenac (VOLTAREN) 75 MG EC tablet TAKE 1 TABLET BY MOUTH TWICE  DAILY   fluticasone (FLONASE) 50 MCG/ACT nasal spray Place 2 sprays into both nostrils daily.   lamoTRIgine (LAMICTAL) 200 MG tablet Take 1 tablet (200 mg total) by mouth 2 (two) times daily.   LORazepam (ATIVAN) 1 MG tablet TAKE 1 TABLET(1 MG) BY MOUTH TWICE DAILY AS NEEDED FOR ANXIETY   MAGNESIUM-ZINC PO Take by mouth.   modafinil (PROVIGIL) 200 MG tablet TAKE ONE TABLET BY MOUTH ONE TIME DAILY   montelukast (SINGULAIR) 10 MG tablet TAKE 1 TABLET BY MOUTH AT  BEDTIME   Multiple Vitamin (MULTIVITAMIN) tablet Take 1 tablet by mouth daily.   Multiple Vitamins-Minerals (VITAMIN D3 COMPLETE PO) Take by mouth.   Omega-3 Fatty Acids (FISH OIL) 1000 MG CAPS Take 2 capsules by mouth daily.   risperiDONE (RISPERDAL) 2 MG tablet Take  2 mg by mouth 2 (two) times daily.   rosuvastatin (CRESTOR) 10 MG tablet TAKE 1 TABLET BY MOUTH  DAILY   TRELEGY ELLIPTA 100-62.5-25 MCG/INH AEPB INHALE 1 PUFF INTO THE LUNGS DAILY   No facility-administered encounter medications on file as of 06/27/2021.   Current COPD regimen:  Proair respiclick 527 mcg/act 2 puff QID prn Montelukast 10 mg Trelegy Ellipta 1 puff  daily No flowsheet data found. Any recent hospitalizations or ED visits since last visit with CPP? No Denies COPD symptoms, states that he has not had any wheezing or sob lately What recent interventions/DTPs have been made by any provider to improve breathing since last visit:None note Have you had exacerbation/flare-up since last visit? No What do you do when you are short of breath?  Rest, patient states if he gets to tired he will sit down and take it easy for a while  Respiratory Devices/Equipment Do you have a nebulizer? No Do you use a Peak Flow Meter? No Do you use a maintenance inhaler? Yes How often do you forget to use your daily inhaler? Patient states that he uses when needed Do you use a rescue inhaler? Yes How often do you use your rescue inhaler?   Patient states that he has not had to use Do you use a spacer with your inhaler? No  Adherence Review: Does the patient have >5 day gap between last estimated fill date for maintenance inhaler medications? No   Care Gaps: Colonoscopy - 09/18/2015 Diabetic Foot Exam - 12/02/2018  Ophthalmology - 01/25/2021 Dexa Scan - NA Annual Well Visit - 03/04/21 Micro albumin - 12/02/2018 Hemoglobin A1c - 03/04/2021  Star Rating Drugs: Rosuvastatin - filled 01/24/21 [place] Pharmacist Assistant [phone]

## 2021-07-10 DIAGNOSIS — D1801 Hemangioma of skin and subcutaneous tissue: Secondary | ICD-10-CM | POA: Diagnosis not present

## 2021-07-10 DIAGNOSIS — D2361 Other benign neoplasm of skin of right upper limb, including shoulder: Secondary | ICD-10-CM | POA: Diagnosis not present

## 2021-07-10 DIAGNOSIS — D225 Melanocytic nevi of trunk: Secondary | ICD-10-CM | POA: Diagnosis not present

## 2021-07-10 DIAGNOSIS — L821 Other seborrheic keratosis: Secondary | ICD-10-CM | POA: Diagnosis not present

## 2021-07-10 DIAGNOSIS — L570 Actinic keratosis: Secondary | ICD-10-CM | POA: Diagnosis not present

## 2021-07-10 DIAGNOSIS — L814 Other melanin hyperpigmentation: Secondary | ICD-10-CM | POA: Diagnosis not present

## 2021-07-10 DIAGNOSIS — Z85828 Personal history of other malignant neoplasm of skin: Secondary | ICD-10-CM | POA: Diagnosis not present

## 2021-07-11 DIAGNOSIS — M5412 Radiculopathy, cervical region: Secondary | ICD-10-CM | POA: Diagnosis not present

## 2021-07-11 DIAGNOSIS — R030 Elevated blood-pressure reading, without diagnosis of hypertension: Secondary | ICD-10-CM | POA: Diagnosis not present

## 2021-07-11 DIAGNOSIS — M542 Cervicalgia: Secondary | ICD-10-CM | POA: Diagnosis not present

## 2021-07-16 ENCOUNTER — Other Ambulatory Visit: Payer: Self-pay | Admitting: Internal Medicine

## 2021-07-16 DIAGNOSIS — M19071 Primary osteoarthritis, right ankle and foot: Secondary | ICD-10-CM

## 2021-07-16 DIAGNOSIS — M4802 Spinal stenosis, cervical region: Secondary | ICD-10-CM

## 2021-07-17 DIAGNOSIS — Z79891 Long term (current) use of opiate analgesic: Secondary | ICD-10-CM | POA: Diagnosis not present

## 2021-08-04 ENCOUNTER — Other Ambulatory Visit: Payer: Self-pay | Admitting: Internal Medicine

## 2021-08-04 DIAGNOSIS — J301 Allergic rhinitis due to pollen: Secondary | ICD-10-CM

## 2021-08-14 ENCOUNTER — Other Ambulatory Visit: Payer: Self-pay | Admitting: Internal Medicine

## 2021-08-14 DIAGNOSIS — G4730 Sleep apnea, unspecified: Secondary | ICD-10-CM

## 2021-08-14 DIAGNOSIS — G4710 Hypersomnia, unspecified: Secondary | ICD-10-CM

## 2021-08-19 ENCOUNTER — Telehealth: Payer: Medicare Other

## 2021-08-20 ENCOUNTER — Other Ambulatory Visit: Payer: Self-pay | Admitting: Internal Medicine

## 2021-08-20 DIAGNOSIS — J449 Chronic obstructive pulmonary disease, unspecified: Secondary | ICD-10-CM

## 2021-08-23 ENCOUNTER — Telehealth: Payer: Medicare Other

## 2021-08-28 ENCOUNTER — Encounter: Payer: Self-pay | Admitting: Internal Medicine

## 2021-09-07 DIAGNOSIS — G4733 Obstructive sleep apnea (adult) (pediatric): Secondary | ICD-10-CM | POA: Diagnosis not present

## 2021-09-09 ENCOUNTER — Encounter: Payer: Self-pay | Admitting: Internal Medicine

## 2021-09-13 ENCOUNTER — Ambulatory Visit (INDEPENDENT_AMBULATORY_CARE_PROVIDER_SITE_OTHER): Payer: Medicare Other

## 2021-09-13 ENCOUNTER — Encounter: Payer: Self-pay | Admitting: Internal Medicine

## 2021-09-13 ENCOUNTER — Ambulatory Visit (INDEPENDENT_AMBULATORY_CARE_PROVIDER_SITE_OTHER): Payer: Medicare Other | Admitting: Internal Medicine

## 2021-09-13 VITALS — BP 110/78 | HR 89 | Temp 97.9°F | Ht 69.0 in | Wt 230.0 lb

## 2021-09-13 DIAGNOSIS — R109 Unspecified abdominal pain: Secondary | ICD-10-CM

## 2021-09-13 DIAGNOSIS — R739 Hyperglycemia, unspecified: Secondary | ICD-10-CM | POA: Diagnosis not present

## 2021-09-13 DIAGNOSIS — R748 Abnormal levels of other serum enzymes: Secondary | ICD-10-CM

## 2021-09-13 LAB — CBC WITH DIFFERENTIAL/PLATELET
Basophils Absolute: 0.1 10*3/uL (ref 0.0–0.1)
Basophils Relative: 1.8 % (ref 0.0–3.0)
Eosinophils Absolute: 0.3 10*3/uL (ref 0.0–0.7)
Eosinophils Relative: 4.3 % (ref 0.0–5.0)
HCT: 44.5 % (ref 39.0–52.0)
Hemoglobin: 15.2 g/dL (ref 13.0–17.0)
Lymphocytes Relative: 28.1 % (ref 12.0–46.0)
Lymphs Abs: 2.1 10*3/uL (ref 0.7–4.0)
MCHC: 34.1 g/dL (ref 30.0–36.0)
MCV: 84.6 fl (ref 78.0–100.0)
Monocytes Absolute: 0.7 10*3/uL (ref 0.1–1.0)
Monocytes Relative: 8.9 % (ref 3.0–12.0)
Neutro Abs: 4.3 10*3/uL (ref 1.4–7.7)
Neutrophils Relative %: 56.9 % (ref 43.0–77.0)
Platelets: 168 10*3/uL (ref 150.0–400.0)
RBC: 5.26 Mil/uL (ref 4.22–5.81)
RDW: 12.9 % (ref 11.5–15.5)
WBC: 7.6 10*3/uL (ref 4.0–10.5)

## 2021-09-13 LAB — BASIC METABOLIC PANEL
BUN: 20 mg/dL (ref 6–23)
CO2: 27 mEq/L (ref 19–32)
Calcium: 9.9 mg/dL (ref 8.4–10.5)
Chloride: 100 mEq/L (ref 96–112)
Creatinine, Ser: 1.15 mg/dL (ref 0.40–1.50)
GFR: 70.88 mL/min (ref >60.00)
Glucose, Bld: 225 mg/dL — ABNORMAL HIGH (ref 70–99)
Potassium: 3.9 mEq/L (ref 3.5–5.1)
Sodium: 138 mEq/L (ref 135–145)

## 2021-09-13 LAB — URINALYSIS, ROUTINE W REFLEX MICROSCOPIC
Bilirubin Urine: NEGATIVE
Hgb urine dipstick: NEGATIVE
Ketones, ur: NEGATIVE
Leukocytes,Ua: NEGATIVE
Nitrite: NEGATIVE
Specific Gravity, Urine: >=1.03 — AB (ref 1.000–1.030)
Total Protein, Urine: 100 — AB
Urine Glucose: 250 — AB
Urobilinogen, UA: 1 (ref 0.0–1.0)
pH: 5.5 (ref 5.0–8.0)

## 2021-09-13 LAB — LIPASE: Lipase: 99 U/L — ABNORMAL HIGH (ref 11.0–59.0)

## 2021-09-13 NOTE — Patient Instructions (Signed)
Flank Pain, Adult ?Flank pain is pain that is located on the side of the body between the upper abdomen and the spine. This area is called the flank. The pain may occur over a short period of time (acute), or it may be long-term or recurring (chronic). It may be mild or severe. Flank pain can be caused by many things, including: ?Muscle soreness or injury. ?Kidney infection, kidney stones, or kidney disease. ?Stress. ?A disease of the spine (vertebral disk disease). ?A lung infection (pneumonia). ?Fluid around the lungs (pulmonary edema). ?A skin rash caused by the chickenpox virus (shingles). ?Tumors that affect the back of the abdomen. ?Gallbladder disease. ?Follow these instructions at home: ? ?Drink enough fluid to keep your urine pale yellow. ?Rest as told by your health care provider. ?Take over-the-counter and prescription medicines only as told by your health care provider. ?Keep a journal to track what has caused your flank pain and what has made it feel better. ?Keep all follow-up visits. This is important. ?Contact a health care provider if: ?Your pain is not controlled with medicine. ?You have new symptoms. ?Your pain gets worse. ?Your symptoms last longer than 2-3 days. ?You have trouble urinating or you are urinating very frequently. ?Get help right away if: ?You have trouble breathing or you are short of breath. ?Your abdomen hurts or it is swollen or red. ?You have nausea or vomiting. ?You feel faint, or you faint. ?You have blood in your urine. ?You have flank pain and a fever. ?These symptoms may represent a serious problem that is an emergency. Do not wait to see if the symptoms will go away. Get medical help right away. Call your local emergency services (911 in the [place] Do not drive yourself to the hospital. ?Summary ?Flank pain is pain that is located on the side of the body between the upper abdomen and the spine. ?The pain may occur over a short period of time (acute), or it may be  long-term or recurring (chronic). It may be mild or severe. ?Flank pain can be caused by many things. ?Contact your health care provider if your symptoms get worse or last longer than 2-3 days. ?This information is not intended to replace advice given to you by your health care provider. Make sure you discuss any questions you have with your health care provider. ?Document Revised: 08/13/2020 Document Reviewed: 08/13/2020 ?Elsevier Patient Education ? 2022 Elsevier Inc[employer] ? ?

## 2021-09-13 NOTE — Progress Notes (Signed)
? ?Subjective:  ?Patient ID: Jeff Norton, male    DOB: 02-Nov-1965  Age: 56 y.o. MRN: [id number] ? ?CC: Abdominal Pain ? ?This visit occurred during the SARS-CoV-2 public health emergency.  Safety protocols were in place, including screening questions prior to the visit, additional usage of staff PPE, and extensive cleaning of exam room while observing appropriate contact time as indicated for disinfecting solutions.   ? ?HPI ?Jeff Norton presents for f/up - ? ?He complains of a 10-day history of pain in his left flank.  He has also had some nausea and vomiting but he thinks it is related to changing Risperdal to Abilify.  He has had some loose bowel movements but says his appetite is good.  He denies fever, chills, dysuria, hematuria, or bright red blood per rectum. ? ?Outpatient Medications Prior to Visit  ?Medication Sig Dispense Refill  ? Albuterol Sulfate (PROAIR RESPICLICK) 086 (90 Base) MCG/ACT AEPB Inhale 1 puff into the lungs 3 (three) times daily as needed. 1 each 5  ? cholecalciferol (VITAMIN D) 25 MCG (1000 UNIT) tablet SMARTSIG:1 Tablet(s) By Mouth    ? cyclobenzaprine (FLEXERIL) 10 MG tablet Take 1 tablet (10 mg total) by mouth 3 (three) times daily as needed for muscle spasms. 45 tablet 2  ? diclofenac (VOLTAREN) 75 MG EC tablet TAKE 1 TABLET BY MOUTH TWICE  DAILY 180 tablet 0  ? fluticasone (FLONASE) 50 MCG/ACT nasal spray Place 2 sprays into both nostrils daily. 48 g 1  ? lamoTRIgine (LAMICTAL) 200 MG tablet Take 1 tablet (200 mg total) by mouth 2 (two) times daily. 180 tablet 1  ? LORazepam (ATIVAN) 1 MG tablet TAKE 1 TABLET(1 MG) BY MOUTH TWICE DAILY AS NEEDED FOR ANXIETY 60 tablet 3  ? MAGNESIUM-ZINC PO Take by mouth.    ? modafinil (PROVIGIL) 200 MG tablet TAKE ONE TABLET BY MOUTH ONE TIME DAILY 90 tablet 0  ? montelukast (SINGULAIR) 10 MG tablet TAKE 1 TABLET BY MOUTH AT  BEDTIME 90 tablet 0  ? Multiple Vitamin (MULTIVITAMIN) tablet Take 1 tablet by mouth daily.    ? Omega-3 Fatty Acids  (FISH OIL) 1000 MG CAPS Take 2 capsules by mouth daily.    ? rosuvastatin (CRESTOR) 10 MG tablet TAKE 1 TABLET BY MOUTH  DAILY 90 tablet 3  ? traMADol (ULTRAM) 50 MG tablet Take 50 mg by mouth every 6 (six) hours as needed.    ? TRELEGY ELLIPTA 100-62.5-25 MCG/ACT AEPB INHALE 1 PUFF INTO THE LUNGS DAILY 120 each 0  ? ZYRTEC ALLERGY 10 MG tablet SMARTSIG:1 By Mouth    ? Multiple Vitamins-Minerals (VITAMIN D3 COMPLETE PO) Take by mouth. (Patient not taking: Reported on 09/13/2021)    ? risperiDONE (RISPERDAL) 2 MG tablet Take 2 mg by mouth 2 (two) times daily.    ? ?No facility-administered medications prior to visit.  ? ? ?ROS ?Review of Systems  ?Constitutional:  Positive for unexpected weight change (wt gain). Negative for chills, diaphoresis, fatigue and fever.  ?HENT: Negative.    ?Eyes: Negative.   ?Respiratory:  Negative for cough, chest tightness, shortness of breath and wheezing.   ?Cardiovascular:  Negative for chest pain, palpitations and leg swelling.  ?Gastrointestinal:  Positive for abdominal pain, diarrhea, nausea and vomiting. Negative for blood in stool.  ?Endocrine: Negative.   ?Genitourinary:  Positive for flank pain. Negative for difficulty urinating, dysuria, testicular pain and urgency.  ?Musculoskeletal:  Negative for myalgias.  ?Skin: Negative.  Negative for color change.  ?Neurological:  Negative for dizziness, weakness, light-headedness and headaches.  ?Hematological:  Negative for adenopathy. Does not bruise/bleed easily.  ?Psychiatric/Behavioral: Negative.    ? ?Objective:  ?BP 110/78 (BP Location: Left Arm, Patient Position: Sitting, Cuff Size: Large)   Pulse 89   Temp 97.9 ?F (36.6 ?C) (Oral)   Ht '5\' 9"'$  (1.753 m)   Wt 230 lb (104.3 kg)   SpO2 95%   BMI 33.97 kg/m?  ? ?BP Readings from Last 3 Encounters:  ?09/13/21 110/78  ?03/25/21 118/70  ?03/04/21 110/60  ? ? ?Wt Readings from Last 3 Encounters:  ?09/13/21 230 lb (104.3 kg)  ?03/25/21 214 lb (97.1 kg)  ?03/04/21 217 lb (98.4 kg)   ? ? ?Physical Exam ?Vitals reviewed.  ?Constitutional:   ?   Appearance: He is obese. He is not ill-appearing.  ?HENT:  ?   Nose: Nose normal.  ?   Mouth/Throat:  ?   Mouth: Mucous membranes are moist.  ?Eyes:  ?   General: No scleral icterus. ?   Conjunctiva/sclera: Conjunctivae normal.  ?Cardiovascular:  ?   Rate and Rhythm: Normal rate and regular rhythm.  ?   Heart sounds: No murmur heard. ?Pulmonary:  ?   Effort: Pulmonary effort is normal.  ?   Breath sounds: No stridor. No wheezing, rhonchi or rales.  ?Abdominal:  ?   General: Abdomen is protuberant. Bowel sounds are normal. There is no distension.  ?   Palpations: Abdomen is soft. There is no hepatomegaly, splenomegaly or mass.  ?   Tenderness: There is abdominal tenderness in the left upper quadrant. There is no right CVA tenderness, left CVA tenderness, guarding or rebound. Negative signs include Murphy's sign.  ?   Hernia: No hernia is present. There is no hernia in the umbilical area, ventral area, left inguinal area, right femoral area, left femoral area or right inguinal area.  ?Musculoskeletal:  ?   Cervical back: Neck supple.  ?Lymphadenopathy:  ?   Cervical: No cervical adenopathy.  ?Neurological:  ?   Mental Status: He is alert.  ? ? ?Lab Results  ?Component Value Date  ? WBC 7.6 09/13/2021  ? HGB 15.2 09/13/2021  ? HCT 44.5 09/13/2021  ? PLT 168.0 09/13/2021  ? GLUCOSE 225 (H) 09/13/2021  ? CHOL 130 03/04/2021  ? TRIG 161.0 (H) 03/04/2021  ? HDL 46.70 03/04/2021  ? LDLDIRECT 59.0 12/02/2018  ? LDLCALC 51 03/04/2021  ? ALT 33 03/04/2021  ? AST 26 03/04/2021  ? NA 138 09/13/2021  ? K 3.9 09/13/2021  ? CL 100 09/13/2021  ? CREATININE 1.15 09/13/2021  ? BUN 20 09/13/2021  ? CO2 27 09/13/2021  ? TSH 1.57 03/04/2021  ? PSA 0.65 03/04/2021  ? INR 1.0 01/11/2020  ? HGBA1C 5.8 03/04/2021  ? MICROALBUR 5.7 (H) 12/02/2018  ? ? ?CT CHEST LUNG CA SCREEN LOW DOSE W/O CM ? ?Result Date: 10/19/2020 ?CLINICAL DATA:  56 year old male with 86 pack-year history of  smoking. Lung cancer screening. EXAM: CT CHEST WITHOUT CONTRAST LOW-DOSE FOR LUNG CANCER SCREENING TECHNIQUE: Multidetector CT imaging of the chest was performed following the standard protocol without IV contrast. COMPARISON:  10/17/2019 FINDINGS: Cardiovascular: The heart size is normal. No substantial pericardial effusion. Coronary artery calcification is evident. Atherosclerotic calcification is noted in the wall of the thoracic aorta. Mediastinum/Nodes: No mediastinal lymphadenopathy. No evidence for gross hilar lymphadenopathy although assessment is limited by the lack of intravenous contrast on today's study. The esophagus has normal imaging features. Upper normal axillary lymph nodes  are seen bilaterally, similar to prior. Lungs/Pleura: Centrilobular emphsyema noted. Scattered tiny bilateral pulmonary nodules identified previously are stable in the interval. No new suspicious pulmonary nodule or mass. No focal airspace consolidation. No pleural effusion. Upper Abdomen: Unremarkable Musculoskeletal: No worrisome lytic or sclerotic osseous abnormality. IMPRESSION: 1. Lung-RADS 2, benign appearance or behavior. Continue annual screening with low-dose chest CT without contrast in 12 months. 2. Aortic Atherosclerosis (ICD10-I70.0) and Emphysema (ICD10-J43.9). Electronically Signed   By: Misty Stanley M.D.   On: 10/19/2020 08:10  ? ?DG ABD ACUTE 2+V W 1V CHEST ? ?Result Date: 09/13/2021 ?CLINICAL DATA:  56 year old male with left abdominal and flank pain for 1 week with nausea. Smoker. EXAM: DG ABDOMEN ACUTE WITH 1 VIEW CHEST COMPARISON:  Chest CT 10/17/2020 and earlier. FINDINGS: PA view of the chest. Lung volumes and mediastinal contours are stable since 2021, borderline to mild pulmonary hyperinflation. Visualized tracheal air column is within normal limits. Prior cervical ACDF. Chronic increased interstitial lung markings appear stable since 2021. No pneumothorax or pneumoperitoneum. Upright and supine views of  the abdomen and pelvis. Cholecystectomy clips. Non obstructed bowel gas pattern. Pelvic phleboliths. Abdominal and pelvic visceral contours are within normal limits. No acute osseous abnormality identified. IMPR

## 2021-09-16 ENCOUNTER — Encounter: Payer: Self-pay | Admitting: Internal Medicine

## 2021-09-20 ENCOUNTER — Ambulatory Visit (INDEPENDENT_AMBULATORY_CARE_PROVIDER_SITE_OTHER)
Admission: RE | Admit: 2021-09-20 | Discharge: 2021-09-20 | Disposition: A | Discharge status: Home / Self Care | Payer: Medicare Other | Source: Ambulatory Visit | Attending: Internal Medicine | Admitting: Internal Medicine

## 2021-09-20 DIAGNOSIS — R748 Abnormal levels of other serum enzymes: Secondary | ICD-10-CM | POA: Diagnosis not present

## 2021-09-20 DIAGNOSIS — R109 Unspecified abdominal pain: Secondary | ICD-10-CM

## 2021-09-20 MED ORDER — IOHEXOL 300 MG/ML  SOLN
100.0000 mL | Freq: Once | INTRAMUSCULAR | Status: AC | PRN
  Administered 2021-09-20: 100 mL via INTRAVENOUS

## 2021-09-24 DIAGNOSIS — Z8371 Family history of colonic polyps: Secondary | ICD-10-CM | POA: Diagnosis not present

## 2021-09-24 DIAGNOSIS — Z86010 Personal history of colonic polyps: Secondary | ICD-10-CM | POA: Diagnosis not present

## 2021-09-24 DIAGNOSIS — Z1211 Encounter for screening for malignant neoplasm of colon: Secondary | ICD-10-CM | POA: Diagnosis not present

## 2021-09-29 ENCOUNTER — Other Ambulatory Visit: Payer: Self-pay | Admitting: Internal Medicine

## 2021-09-29 DIAGNOSIS — E785 Hyperlipidemia, unspecified: Secondary | ICD-10-CM

## 2021-10-02 ENCOUNTER — Encounter: Payer: Self-pay | Admitting: Internal Medicine

## 2021-10-09 ENCOUNTER — Telehealth: Payer: Medicare Other

## 2021-10-11 ENCOUNTER — Telehealth: Payer: Medicare Other

## 2021-10-11 NOTE — Progress Notes (Unsigned)
? ?Chronic Care Management ?Pharmacy Note ? ?10/11/2021 ?Name:  Jeff Norton MRN:  [id number] DOB:  [birth year] ? ?Summary: ?*** ? ?Recommendations/Changes made from today's visit: ?*** ? ?Plan: ?*** ? ?Subjective: ?Jeff Norton is an 56 y.o. year old male who is a primary patient of [name], MD.  The CCM team was consulted for assistance with disease management and care coordination needs.   ? ?Engaged with patient by telephone for follow up visit in response to provider referral for pharmacy case management and/or care coordination services.  ? ?Consent to Services:  ?The patient was given the following information about Chronic Care Management services today, agreed to services, and gave verbal consent: 1. CCM service includes personalized support from designated clinical staff supervised by the primary care provider, including individualized plan of care and coordination with other care providers 2. 24/7 contact phone numbers for assistance for urgent and routine care needs. 3. Service will only be billed when office clinical staff spend 20 minutes or more in a month to coordinate care. 4. Only one practitioner may furnish and bill the service in a calendar month. 5.The patient may stop CCM services at any time (effective at the end of the month) by phone call to the office staff. 6. The patient will be responsible for cost sharing (co-pay) of up to 20% of the service fee (after annual deductible is met). Patient agreed to services and consent obtained. ? ?Patient Care Team: ?[name], MD as PCP - General (Internal Medicine) ?Foltanski, Cleaster Corin, [facility] as Pharmacist (Pharmacist) ? ?Recent office visits: ?*** ? ?Recent consult visits: ?*** ? ?Hospital visits: ?{Hospital DC Yes/No:25215} ? ?Objective: ? ?Lab Results  ?Component Value Date  ? CREATININE 1.15 09/13/2021  ? BUN 20 09/13/2021  ? GFR 70.88 09/13/2021  ? GFRNONAA 104 01/11/2020  ? GFRAA 120 01/11/2020  ? NA 138 09/13/2021  ? K 3.9  09/13/2021  ? CALCIUM 9.9 09/13/2021  ? CO2 27 09/13/2021  ? GLUCOSE 225 (H) 09/13/2021  ? ? ?Lab Results  ?Component Value Date/Time  ? HGBA1C 5.8 03/04/2021 04:20 PM  ? HGBA1C 5.7 09/24/2020 04:39 PM  ? GFR 70.88 09/13/2021 10:10 AM  ? GFR 97.84 03/04/2021 04:20 PM  ? MICROALBUR 5.7 (H) 12/02/2018 04:13 PM  ? MICROALBUR 4.8 (H) 02/19/2017 09:45 AM  ?  ?Last diabetic Eye exam:  ?Lab Results  ?Component Value Date/Time  ? HMDIABEYEEXA No Retinopathy 01/25/2021 12:00 AM  ? HMDIABEYEEXA No Retinopathy 01/25/2021 12:00 AM  ?  ?Last diabetic Foot exam:  ?No results found for: HMDIABFOOTEX  ? ?Lab Results  ?Component Value Date  ? CHOL 130 03/04/2021  ? HDL 46.70 03/04/2021  ? LDLCALC 51 03/04/2021  ? LDLDIRECT 59.0 12/02/2018  ? TRIG 161.0 (H) 03/04/2021  ? CHOLHDL 3 03/04/2021  ? ? ? ?  Latest Ref Rng & Units 03/04/2021  ?  4:20 PM 01/11/2020  ?  4:41 PM 12/28/2019  ? 12:41 PM  ?Hepatic Function  ?Total Protein 6.0 - 8.3 g/dL 7.5   7.5   7.0    ?Albumin 3.5 - 5.2 g/dL 4.8    4.4    ?AST 0 - 37 U/L 26   23   65    ?ALT 0 - 53 U/L 33   31   81    ?Alk Phosphatase 39 - 117 U/L 57    97    ?Total Bilirubin 0.2 - 1.2 mg/dL 0.9   1.1   1.5    ?  Bilirubin, Direct 0.0 - 0.3 mg/dL 0.2   0.3     ? ? ?Lab Results  ?Component Value Date/Time  ? TSH 1.57 03/04/2021 04:20 PM  ? TSH 1.61 12/02/2018 04:13 PM  ? ? ? ?  Latest Ref Rng & Units 09/13/2021  ? 10:10 AM 03/04/2021  ?  4:20 PM 09/24/2020  ?  4:39 PM  ?CBC  ?WBC 4.0 - 10.5 K/uL 7.6   8.6   9.3    ?Hemoglobin 13.0 - 17.0 g/dL 15.2   14.1   14.1    ?Hematocrit 39.0 - 52.0 % 44.5   40.7   40.8    ?Platelets 150.0 - 400.0 K/uL 168.0   145.0   142.0    ? ? ?No results found for: VD25OH ? ?Clinical ASCVD: No  ?The 10-year ASCVD risk score (Arnett DK, et al., 2019) is: 11.4% ?  Values used to calculate the score: ?    Age: 59 years ?    Sex: Male ?    Is Non-Hispanic African American: No ?    Diabetic: Yes ?    Tobacco smoker: Yes ?    Systolic Blood Pressure: 834 mmHg ?    Is BP treated:  No ?    HDL Cholesterol: 46.7 mg/dL ?    Total Cholesterol: 130 mg/dL   ? ? ?  03/04/2021  ?  3:26 PM 10/19/2019  ?  4:01 PM 12/02/2018  ?  3:37 PM  ?Depression screen PHQ 2/9  ?Decreased Interest 0 1 1  ?Down, Depressed, Hopeless 1 0 0  ?PHQ - 2 Score 1 1 1   ?Altered sleeping  1 1  ?Tired, decreased energy  0 1  ?Change in appetite  0 0  ?Feeling bad or failure about yourself   0 0  ?Trouble concentrating  0 0  ?Moving slowly or fidgety/restless  0 0  ?Suicidal thoughts  0 0  ?PHQ-9 Score  2 3  ?Difficult doing work/chores  Not difficult at all Not difficult at all  ?  ? ?Social History  ? ?Tobacco Use  ?Smoking Status Every Day  ? Packs/day: 1.00  ? Years: 30.00  ? Pack years: 30.00  ? Types: Cigarettes  ?Smokeless Tobacco Never  ? ?BP Readings from Last 3 Encounters:  ?09/13/21 110/78  ?03/25/21 118/70  ?03/04/21 110/60  ? ?Pulse Readings from Last 3 Encounters:  ?09/13/21 89  ?03/25/21 85  ?03/04/21 66  ? ?Wt Readings from Last 3 Encounters:  ?09/13/21 230 lb (104.3 kg)  ?03/25/21 214 lb (97.1 kg)  ?03/04/21 217 lb (98.4 kg)  ? ?BMI Readings from Last 3 Encounters:  ?09/13/21 33.97 kg/m?  ?03/25/21 31.60 kg/m?  ?03/04/21 32.05 kg/m?  ? ? ?Assessment/Interventions: Review of patient past medical history, allergies, medications, health status, including review of consultants reports, laboratory and other test data, was performed as part of comprehensive evaluation and provision of chronic care management services.  ? ?SDOH:  (Social Determinants of Health) assessments and interventions performed: Yes ? ?SDOH Screenings  ? ?Alcohol Screen: Not on file  ?Depression (PHQ2-9): Low Risk   ? PHQ-2 Score: 1  ?Financial Resource Strain: Not on file  ?Food Insecurity: Not on file  ?Housing: Not on file  ?Physical Activity: Not on file  ?Social Connections: Not on file  ?Stress: Not on file  ?Tobacco Use: High Risk  ? Smoking Tobacco Use: Every Day  ? Smokeless Tobacco Use: Never  ? Passive Exposure: Not on file   ?Transportation Needs:  Not on file  ? ? ?[place] ? ?Allergies  ?Allergen Reactions  ? Nicotine Polacrilex Rash  ?  Nicotine patch causes rash  ? Azithromycin   ? Cleocin [Clindamycin Hcl]   ? Oxycodone Nausea And Vomiting  ? ? ?Medications Reviewed Today   ? ? Reviewed by [name], MD (Physician) on 09/13/21 at 1003  Med List Status: <None>  ? ?Medication Order Taking? Sig Documenting Provider Last Dose Status Informant  ?Albuterol Sulfate (PROAIR RESPICLICK) 309 (90 Base) MCG/ACT AEPB [id number] Yes Inhale 1 puff into the lungs 3 (three) times daily as needed. [name], MD Taking Active   ?cholecalciferol (VITAMIN D) 25 MCG (1000 UNIT) tablet [id number] Yes SMARTSIG:1 Tablet(s) By Mouth [provider] Taking Active   ?cyclobenzaprine (FLEXERIL) 10 MG tablet [id number] Yes Take 1 tablet (10 mg total) by mouth 3 (three) times daily as needed for muscle spasms. [name], MD Taking Active   ?diclofenac (VOLTAREN) 75 MG EC tablet [id number] Yes TAKE 1 TABLET BY MOUTH TWICE  DAILY [name], MD Taking Active   ?fluticasone (FLONASE) 50 MCG/ACT nasal spray [id number] Yes Place 2 sprays into both nostrils daily. [name], MD Taking Active   ?lamoTRIgine (LAMICTAL) 200 MG tablet [id number] Yes Take 1 tablet (200 mg total) by mouth 2 (two) times daily. [name], MD Taking Active   ?LORazepam (ATIVAN) 1 MG tablet [id number] Yes TAKE 1 TABLET(1 MG) BY MOUTH TWICE DAILY AS NEEDED FOR ANXIETY [name], MD Taking Active   ?MAGNESIUM-ZINC PO [id number] Yes Take by mouth. [provider] Taking Active   ?modafinil (PROVIGIL) 200 MG tablet [id number] Yes TAKE ONE TABLET BY MOUTH ONE TIME DAILY [name], MD Taking Active   ?montelukast (SINGULAIR) 10 MG tablet [id number] Yes TAKE 1 TABLET BY MOUTH AT  BEDTIME [name], MD Taking Active   ?Multiple Vitamin (MULTIVITAMIN) tablet [id number] Yes Take 1 tablet by mouth daily. [provider] Taking  Active   ?Multiple Vitamins-Minerals (VITAMIN D3 COMPLETE PO) [id number] No Take by mouth.  ?Patient not taking: Reported on 09/13/2021  ? [provider] Not Taking Active   ?Omega-3 Fatty Acids (FISH OIL) 1000 MG CAPS

## 2021-10-17 ENCOUNTER — Other Ambulatory Visit: Payer: Self-pay | Admitting: Internal Medicine

## 2021-10-17 DIAGNOSIS — J449 Chronic obstructive pulmonary disease, unspecified: Secondary | ICD-10-CM

## 2021-10-18 ENCOUNTER — Other Ambulatory Visit: Payer: Self-pay | Admitting: Internal Medicine

## 2021-10-18 ENCOUNTER — Ambulatory Visit (INDEPENDENT_AMBULATORY_CARE_PROVIDER_SITE_OTHER)
Admission: RE | Admit: 2021-10-18 | Discharge: 2021-10-18 | Disposition: A | Discharge status: Home / Self Care | Payer: Medicare Other | Source: Ambulatory Visit | Attending: Acute Care | Admitting: Acute Care

## 2021-10-18 DIAGNOSIS — F17210 Nicotine dependence, cigarettes, uncomplicated: Secondary | ICD-10-CM

## 2021-10-18 DIAGNOSIS — Z87891 Personal history of nicotine dependence: Secondary | ICD-10-CM | POA: Diagnosis not present

## 2021-10-18 DIAGNOSIS — M19071 Primary osteoarthritis, right ankle and foot: Secondary | ICD-10-CM

## 2021-10-18 DIAGNOSIS — M4802 Spinal stenosis, cervical region: Secondary | ICD-10-CM

## 2021-10-21 ENCOUNTER — Other Ambulatory Visit: Payer: Self-pay | Admitting: Internal Medicine

## 2021-10-21 DIAGNOSIS — M4802 Spinal stenosis, cervical region: Secondary | ICD-10-CM

## 2021-10-22 ENCOUNTER — Other Ambulatory Visit: Payer: Self-pay

## 2021-10-22 DIAGNOSIS — Z87891 Personal history of nicotine dependence: Secondary | ICD-10-CM

## 2021-10-22 DIAGNOSIS — F17210 Nicotine dependence, cigarettes, uncomplicated: Secondary | ICD-10-CM

## 2021-10-22 DIAGNOSIS — Z122 Encounter for screening for malignant neoplasm of respiratory organs: Secondary | ICD-10-CM

## 2021-10-22 NOTE — Progress Notes (Signed)
Ct

## 2021-10-23 ENCOUNTER — Other Ambulatory Visit: Payer: Self-pay | Admitting: Internal Medicine

## 2021-10-23 DIAGNOSIS — J449 Chronic obstructive pulmonary disease, unspecified: Secondary | ICD-10-CM

## 2021-11-01 ENCOUNTER — Telehealth: Payer: Self-pay

## 2021-11-01 NOTE — Chronic Care Management (AMB) (Signed)
Chronic Care Management Pharmacy Assistant   Name: Jeff Norton  MRN: [id number] DOB: 1965-11-13  Reason for Encounter: Disease State-Adherence    Recent office visits:  09/13/21 [name], MD-PCP (Acute left flank pain) Blood work ordered: BMP, Lipase<urinalysis, CBC; medication changes: discontinued risperidone  Recent consult visits:  07/11/21 Guy Begin MD, [facility] visits:  None since last coordination call 06/27/21  Medications: Outpatient Encounter Medications as of 11/01/2021  Medication Sig   Albuterol Sulfate (PROAIR RESPICLICK) 106 (90 Base) MCG/ACT AEPB Inhale 1 puff into the lungs 3 (three) times daily as needed.   cholecalciferol (VITAMIN D) 25 MCG (1000 UNIT) tablet SMARTSIG:1 Tablet(s) By Mouth   cyclobenzaprine (FLEXERIL) 10 MG tablet TAKE 1 TABLET(10 MG) BY MOUTH THREE TIMES DAILY AS NEEDED FOR MUSCLE SPASMS   diclofenac (VOLTAREN) 75 MG EC tablet TAKE 1 TABLET BY MOUTH TWICE  DAILY   fluticasone (FLONASE) 50 MCG/ACT nasal spray Place 2 sprays into both nostrils daily.   lamoTRIgine (LAMICTAL) 200 MG tablet Take 1 tablet (200 mg total) by mouth 2 (two) times daily.   LORazepam (ATIVAN) 1 MG tablet TAKE 1 TABLET(1 MG) BY MOUTH TWICE DAILY AS NEEDED FOR ANXIETY   MAGNESIUM-ZINC PO Take by mouth.   modafinil (PROVIGIL) 200 MG tablet TAKE ONE TABLET BY MOUTH ONE TIME DAILY   montelukast (SINGULAIR) 10 MG tablet TAKE 1 TABLET BY MOUTH AT  BEDTIME   Multiple Vitamin (MULTIVITAMIN) tablet Take 1 tablet by mouth daily.   Multiple Vitamins-Minerals (VITAMIN D3 COMPLETE PO) Take by mouth. (Patient not taking: Reported on 09/13/2021)   Omega-3 Fatty Acids (FISH OIL) 1000 MG CAPS Take 2 capsules by mouth daily.   rosuvastatin (CRESTOR) 10 MG tablet TAKE 1 TABLET BY MOUTH  DAILY   traMADol (ULTRAM) 50 MG tablet Take 50 mg by mouth every 6 (six) hours as needed.   TRELEGY ELLIPTA 100-62.5-25 MCG/ACT AEPB INHALE 1 PUFF INTO  THE LUNGS DAILY   ZYRTEC ALLERGY 10 MG tablet SMARTSIG:1 By Mouth   No facility-administered encounter medications on file as of 11/01/2021.   [place] for General Review Call   Chart Review:  Have there been any documented new, changed, or discontinued medications since last visit? Yes (If yes, include name, dose, frequency, date)09/13/21 Scarlette Calico L medication changes: discontinued risperidone Has there been any documented recent hospitalizations or ED visits since last visit with Clinical Pharmacist? No Brief Summary (including medication and/or Diagnosis changes):   Adherence Review:  Does the Clinical Pharmacist Assistant have access to adherence rates? Yes Adherence rates for STAR metric medications (List medication(s)/day supply/ last 2 fill dates). Adherence rates for medications indicated for disease state being reviewed (List medication(s)/day supply/ last 2 fill dates). Does the patient have >5 day gap between last estimated fill dates for any of the above medications or other medication gaps? No Reason for medication gaps.   Disease State Questions:  Able to connect with Patient? Yes  Did patient have any problems with their health recently? No Note problems and Concerns:  Have you had any admissions or emergency room visits or worsening of your condition(s) since last visit? No, not since last coordination call  Have you had any visits with new specialists or providers since your last visit? Yes Explain:07/11/21 Guy Begin MD, [facility] (Cervicalgia, Elevated blood-pressure reading, without diagnosis of hypertension)  Have you had any new health care problem(s)  since your last visit? No New problem(s) reported:  Have you run out of any of your medications since you last spoke with clinical pharmacist? No What caused you to run out of your medications?  Are there any medications you are not taking as  prescribed? No What kept you from taking your medications as prescribed?  Are you having any issues or side effects with your medications? No Note of issues or side effects:  Do you have any other health concerns or questions you want to discuss with your Clinical Pharmacist before your next visit? No Note additional concerns and questions from Patient.  Are there any health concerns that you feel we can do a better job addressing? No Note Patient's response.  Are you having any problems with any of the following since the last visit: (select all that apply)  None  Details:  12. Any falls since last visit? No  Details:  13. Any increased or uncontrolled pain since last visit? Yes  Details:Patient states that he has back pain at times but is taking flexeril 10 mg  14. Next visit Type: telephone       Visit with:clinical pharmacist        Date:11/15/21        Time:10 am  15. Additional Details? Yes, patient states that he add a new medication added by NP Julian Hy from [place] 120 mg     Care Gaps: Colonoscopy - 09/24/21 Diabetic Foot Exam - 12/02/2018 Ophthalmology - 01/25/2021 Dexa Scan - NA Annual Well Visit - 03/04/21 Micro albumin - 12/02/2018 Hemoglobin A1c - 03/04/2021   Star Rating Drugs: Rosuvastatin - filled 10/20/21 [place] Pharmacist Assistant [phone]

## 2021-11-15 ENCOUNTER — Telehealth: Payer: Medicare Other

## 2021-11-15 DIAGNOSIS — L821 Other seborrheic keratosis: Secondary | ICD-10-CM | POA: Diagnosis not present

## 2021-11-15 DIAGNOSIS — L570 Actinic keratosis: Secondary | ICD-10-CM | POA: Diagnosis not present

## 2021-11-15 NOTE — Progress Notes (Deleted)
Chronic Care Management Pharmacy Note  CP - 45mns ***  11/15/2021 Name:  Jeff RENSTROMMRN:  [id number]:  [birth year] Summary: ***  Recommendations/Changes made from today's visit: ***  Plan: ***  Subjective: Jeff LACHMANis an 57y.o. year old male who is a primary patient of JJanith Lima MD.  The CCM team was consulted for assistance with disease management and care coordination needs.    Engaged with patient by telephone for follow up visit in response to provider referral for pharmacy case management and/or care coordination services.   Consent to Services:  The patient was given the following information about Chronic Care Management services today, agreed to services, and gave verbal consent: 1. CCM service includes personalized support from designated clinical staff supervised by the primary care provider, including individualized plan of care and coordination with other care providers 2. 24/7 contact phone numbers for assistance for urgent and routine care needs. 3. Service will only be billed when office clinical staff spend 20 minutes or more in a month to coordinate care. 4. Only one practitioner may furnish and bill the service in a calendar month. 5.The patient may stop CCM services at any time (effective at the end of the month) by phone call to the office staff. 6. The patient will be responsible for cost sharing (co-pay) of up to 20% of the service fee (after annual deductible is met). Patient agreed to services and consent obtained.  Patient Care Team: JJanith Lima MD as PCP - General (Internal Medicine) FCharlton Haws [facility] Pharmacist (Pharmacist)  Recent office visits: 09/13/2021 - Dr. JRonnald Ramp- abdominal pain - pt thinks it is related to starting risperidone, d/c'd risperidone, recommended further evaluation for pancreatitis   [facility]: [facility] not available   Hospital  visits: {Hospital DC Yes/No:25215}  Objective:  Lab Results  Component Value Date   CREATININE 1.15 09/13/2021   BUN 20 09/13/2021   GFR 70.88 09/13/2021   GFRNONAA 104 01/11/2020   GFRAA 120 01/11/2020   NA 138 09/13/2021   K 3.9 09/13/2021   CALCIUM 9.9 09/13/2021   CO2 27 09/13/2021   GLUCOSE 225 (H) 09/13/2021    Lab Results  Component Value Date/Time   HGBA1C 5.8 03/04/2021 04:20 PM   HGBA1C 5.7 09/24/2020 04:39 PM   GFR 70.88 09/13/2021 10:10 AM   GFR 97.84 03/04/2021 04:20 PM   MICROALBUR 5.7 (H) 12/02/2018 04:13 PM   MICROALBUR 4.8 (H) 02/19/2017 09:45 AM    Last diabetic Eye exam:  Lab Results  Component Value Date/Time   HMDIABEYEEXA No Retinopathy 01/25/2021 12:00 AM   HMDIABEYEEXA No Retinopathy 01/25/2021 12:00 AM    Last diabetic Foot exam:  No results found for: HMDIABFOOTEX   Lab Results  Component Value Date   CHOL 130 03/04/2021   HDL 46.70 03/04/2021   LDLCALC 51 03/04/2021   LDLDIRECT 59.0 12/02/2018   TRIG 161.0 (H) 03/04/2021   CHOLHDL 3 03/04/2021       Latest Ref Rng & Units 03/04/2021    4:20 PM 01/11/2020    4:41 PM 12/28/2019   12:41 PM  Hepatic Function  Total Protein 6.0 - 8.3 g/dL 7.5   7.5   7.0    Albumin 3.5 - 5.2 g/dL 4.8    4.4    AST 0 - 37 U/L 26   23   65    ALT 0 - 53  U/L 33   31   81    Alk Phosphatase 39 - 117 U/L 57    97    Total Bilirubin 0.2 - 1.2 mg/dL 0.9   1.1   1.5    Bilirubin, Direct 0.0 - 0.3 mg/dL 0.2   0.3       Lab Results  Component Value Date/Time   TSH 1.57 03/04/2021 04:20 PM   TSH 1.61 12/02/2018 04:13 PM       Latest Ref Rng & Units 09/13/2021   10:10 AM 03/04/2021    4:20 PM 09/24/2020    4:39 PM  CBC  WBC 4.0 - 10.5 K/uL 7.6   8.6   9.3    Hemoglobin 13.0 - 17.0 g/dL 15.2   14.1   14.1    Hematocrit 39.0 - 52.0 % 44.5   40.7   40.8    Platelets 150.0 - 400.0 K/uL 168.0   145.0   142.0      No results found for: VD25OH  Clinical ASCVD: No  The 10-year ASCVD risk score (Arnett DK,  et al., 2019) is: 11.4%   Values used to calculate the score:     Age: 60 years     Sex: Male     Is Non-Hispanic African American: No     Diabetic: Yes     Tobacco smoker: Yes     Systolic Blood Pressure: 004 mmHg     Is BP treated: No     HDL Cholesterol: 46.7 mg/dL     Total Cholesterol: 130 mg/dL       03/04/2021    3:26 PM 10/19/2019    4:01 PM 12/02/2018    3:37 PM  Depression screen PHQ 2/9  Decreased Interest 0 1 1  Down, Depressed, Hopeless 1 0 0  PHQ - 2 Score 1 1 1   Altered sleeping  1 1  Tired, decreased energy  0 1  Change in appetite  0 0  Feeling bad or failure about yourself   0 0  Trouble concentrating  0 0  Moving slowly or fidgety/restless  0 0  Suicidal thoughts  0 0  PHQ-9 Score  2 3  Difficult doing work/chores  Not difficult at all Not difficult at all     Social History   Tobacco Use  Smoking Status Every Day   Packs/day: 1.00   Years: 30.00   Pack years: 30.00   Types: Cigarettes  Smokeless Tobacco Never   BP Readings from Last 3 Encounters:  09/13/21 110/78  03/25/21 118/70  03/04/21 110/60   Pulse Readings from Last 3 Encounters:  09/13/21 89  03/25/21 85  03/04/21 66   Wt Readings from Last 3 Encounters:  09/13/21 230 lb (104.3 kg)  03/25/21 214 lb (97.1 kg)  03/04/21 217 lb (98.4 kg)   BMI Readings from Last 3 Encounters:  09/13/21 33.97 kg/m  03/25/21 31.60 kg/m  03/04/21 32.05 kg/m    Assessment/Interventions: Review of patient past medical history, allergies, medications, health status, including review of consultants reports, laboratory and other test data, was performed as part of comprehensive evaluation and provision of chronic care management services.   SDOH:  (Social Determinants of Health) assessments and interventions performed: Yes  SDOH Screenings   Alcohol Screen: Not on file  Depression (PHQ2-9): Low Risk    PHQ-2 Score: 1  Financial Resource Strain: Not on file  Food Insecurity: Not on file   Housing: Not on file  Physical Activity: Not  on file  Social Connections: Not on file  Stress: Not on file  Tobacco Use: High Risk   Smoking Tobacco Use: Every Day   Smokeless Tobacco Use: Never   Passive Exposure: Not on file  Transportation Needs: Not on file    CCM Care Plan  Allergies  Allergen Reactions   Nicotine Polacrilex Rash    Nicotine patch causes rash   Azithromycin    Cleocin [Clindamycin Hcl]    Oxycodone Nausea And Vomiting    Medications Reviewed Today     Reviewed by [name], MD (Physician) on 09/13/21 at 1003  Med List Status: <None>   Medication Order Taking? Sig Documenting Provider Last Dose Status Informant  Albuterol Sulfate (PROAIR RESPICLICK) 027 (90 Base) MCG/ACT AEPB [id number] Yes Inhale 1 puff into the lungs 3 (three) times daily as needed. [name], MD Taking Active   cholecalciferol (VITAMIN D) 25 MCG (1000 UNIT) tablet [id number] Yes SMARTSIG:1 Tablet(s) By Mouth [provider] Taking Active   cyclobenzaprine (FLEXERIL) 10 MG tablet [id number] Yes Take 1 tablet (10 mg total) by mouth 3 (three) times daily as needed for muscle spasms. [name], MD Taking Active   diclofenac (VOLTAREN) 75 MG EC tablet [id number] Yes TAKE 1 TABLET BY MOUTH TWICE  DAILY [name], MD Taking Active   fluticasone [facility]) 50 MCG/ACT nasal spray [id number] Yes Place 2 sprays into both nostrils daily. [name], MD Taking Active   lamoTRIgine (LAMICTAL) 200 MG tablet [id number] Yes Take 1 tablet (200 mg total) by mouth 2 (two) times daily. [name], MD Taking Active   LORazepam (ATIVAN) 1 MG tablet [id number] Yes TAKE 1 TABLET(1 MG) BY MOUTH TWICE DAILY AS NEEDED FOR ANXIETY [name], MD Taking Active   MAGNESIUM-ZINC PO [id number] Yes Take by mouth. [provider] Taking Active   modafinil (PROVIGIL) 200 MG tablet [id number] Yes TAKE ONE TABLET BY MOUTH ONE TIME DAILY [name], MD Taking Active    montelukast (SINGULAIR) 10 MG tablet [id number] Yes TAKE 1 TABLET BY MOUTH AT  BEDTIME [name], MD Taking Active   Multiple Vitamin (MULTIVITAMIN) tablet [id number] Yes Take 1 tablet by mouth daily. [provider] Taking Active   Multiple Vitamins-Minerals (VITAMIN D3 COMPLETE PO) [id number] No Take by mouth.  Patient not taking: Reported on 09/13/2021   [provider] Not Taking Active   Omega-3 Fatty Acids (FISH OIL) 1000 MG CAPS [id number] Yes Take 2 capsules by mouth daily. [provider] Taking Active   rosuvastatin (CRESTOR) 10 MG tablet [id number] Yes TAKE 1 TABLET BY MOUTH  DAILY [name], MD Taking Active   traMADol (ULTRAM) 50 MG tablet [id number] Yes Take 50 mg by mouth every 6 (six) hours as needed. [provider] Taking Active   TRELEGY ELLIPTA 100-62.5-25 MCG/ACT AEPB [id number] Yes INHALE 1 PUFF INTO THE LUNGS DAILY [name], MD Taking Active   ZYRTEC ALLERGY 10 MG tablet [id number] Yes SMARTSIG:1 By Mouth [provider] Taking Active             Patient Active Problem List   Diagnosis Date Noted   Acute left flank pain 09/13/2021   Chronic hyperglycemia 09/13/2021   Elevated lipase 09/13/2021   Long term current use of opiate analgesic 03/26/2021   Radiculitis of right cervical region 03/25/2021   Encounter for general adult medical examination with abnormal findings 03/04/2021   Occult blood in stools 03/04/2021   Thrombocytopenia (  [place]) 09/24/2020   Pneumonia of both lower lobes due to infectious organism 01/11/2020   Elevated LFTs 01/11/2020   Continuous tobacco abuse 12/20/2019   GERD with apnea 10/19/2019   Abnormal electrocardiogram (ECG) (EKG) 12/02/2018   GAD (generalized anxiety disorder) 09/29/2018   Nocturia associated with benign prostatic hyperplasia 02/25/2018   Bipolar 1 disorder, depressed, full remission ([place]) 11/20/2016   Spinal stenosis in cervical region 09/18/2016   Sleep apnea  with hypersomnolence 08/19/2016   Bile salt-induced diarrhea 01/02/2016   Chronic pansinusitis 01/01/2016   COPD with asthma ([place]) 11/07/2015   Type II diabetes mellitus with manifestations ([place]) 11/07/2015   Routine general medical examination at a health care facility 11/07/2015   Obesity (BMI 30.0-34.9) 02/24/2012   Smokers' cough ([place]) 02/24/2012   Osteoarthritis of ankle and foot 10/08/2007   Hyperlipidemia LDL goal <100 04/16/2007   Allergic rhinitis due to pollen 04/16/2007   Asthma, mild intermittent, poorly controlled 04/16/2007   OSA on CPAP 04/16/2007    Immunization History  Administered Date(s) Administered   Fluad Quad(high Dose 65+) 02/28/2019   Influenza,inj,Quad PF,6+ Mos 04/14/2016, 02/19/2017, 02/25/2018, 03/04/2021   Influenza-Unspecified 08/11/2015   PFIZER(Purple Top)SARS-COV-2 Vaccination 08/30/2019, 09/10/2019   PNEUMOCOCCAL CONJUGATE-20 09/24/2020   Pneumococcal Polysaccharide-23 11/07/2015   Tdap 07/25/2015   Zoster Recombinat (Shingrix) 06/26/2017, 09/04/2017    Conditions to be addressed/monitored:  Hyperlipidemia and COPD  There are no care plans that you recently modified to display for this patient.     Medication Assistance: None required.  Patient affirms current coverage meets needs.  Care Gaps: COVID booster  Foot Exam Urine Microalbumin A1c  Patient's preferred pharmacy is:  [employer] ([place] [address] [address] Phone: [phone] Fax: [phone]  [facility] [place], [place] [address] Phone: [phone] Fax: [phone]  [facility] [address] [place] [address] [place] [address] Phone: [phone] Fax: [phone]  [facility] ([facility] ) -  [place], [place] [address] Phone: [phone] Fax: [phone]   Uses pill box? Yes Pt endorses 100% compliance  Care Plan and Follow Up Patient Decision:  Patient agrees to Care Plan and Follow-up.  Plan: Telephone follow up appointment with care management team member scheduled for:  ***  ***  Current Barriers:  Unable to independently monitor therapeutic efficacy  Pharmacist Clinical Goal(s):  Patient will achieve adherence to monitoring guidelines and medication adherence to achieve therapeutic efficacy through collaboration with PharmD and provider.   Interventions: 1:1 collaboration with [name], MD regarding development and update of comprehensive plan of care as evidenced by provider attestation and co-signature Inter-disciplinary care team collaboration (see longitudinal plan of care) Comprehensive medication review performed; medication list updated in electronic medical record  Hyperlipidemia: (LDL goal < 100) -Controlled Lab Results  Component Value Date   LDLCALC 51 03/04/2021  -Current treatment: Rosuvastatin 10mg  daily  Fish Oils 200mg  daily  -Medications previously tried:  n/a -Current dietary patterns: *** -Current exercise habits: *** -Educated on {CCM HLD Counseling:25126} -{CCMPHARMDINTERVENTION:25122}  COPD (Goal: control symptoms and prevent exacerbations) -Controlled -Current treatment  Trelegy 1 puff daily  Proair Respiclick - 1 puff 3 times daily as needed  -  Medications previously tried: breo, advair,   -Exacerbations requiring treatment in last 6 months: *** -Patient {Actions; denies-reports:120008} consistent use of maintenance inhaler -Frequency of rescue inhaler use: *** -Counseled on {CCMINHALERCOUNSELING:25121} -{CCMPHARMDINTERVENTION:25122}  Osteoarthritis / Radiculitis (Goal: Pain Control) -{US controlled/uncontrolled:25276} -Current treatment  Tramadol 54m  every 6 hours as needed  Diclofenac 725mtwice daily  Cyclobenzaprine 1081m times daily prn  -Medications previously tried: nucynta, norco  -{CCMPHARMDINTERVENTION:25122}  Bipolar Disorder / Anxiety (Goal: Mood Stabilization) -Controlled -Current treatment  Lamotrigine 200m65mice daily  Lorazepam 1mg 50mce daily prn  Latuda 120mg 38my  -Medications previously tried: risperidone, abilify   -{CCMPHARMDINTERVENTION:25122}   Health Maintenance -Vaccine gaps: *** -Current therapy:  Zyrtec 10mg d58m  Vitamin D 1000 units daily  Montelukast 10mg da13m Flonase  Multivitamin daily  Magnesium -Zinc -Educated on {ccm supplement counseling:25128} -{CCM Patient satisfied:25129} -{CCMPHARMDINTERVENTION:25122}  Patient Goals/Self-Care Activities Patient will:  - {pharmacypatientgoals:24919}  Follow Up Plan: {CM FOLLOW UP PLAN:222WZLY:78004}

## 2021-11-17 ENCOUNTER — Other Ambulatory Visit: Payer: Self-pay | Admitting: Internal Medicine

## 2021-11-17 DIAGNOSIS — F411 Generalized anxiety disorder: Secondary | ICD-10-CM

## 2021-12-21 ENCOUNTER — Other Ambulatory Visit: Payer: Self-pay | Admitting: Internal Medicine

## 2021-12-21 DIAGNOSIS — M4802 Spinal stenosis, cervical region: Secondary | ICD-10-CM

## 2021-12-21 DIAGNOSIS — M19071 Primary osteoarthritis, right ankle and foot: Secondary | ICD-10-CM

## 2021-12-24 ENCOUNTER — Other Ambulatory Visit: Payer: Self-pay | Admitting: Internal Medicine

## 2021-12-24 DIAGNOSIS — E785 Hyperlipidemia, unspecified: Secondary | ICD-10-CM

## 2022-01-22 ENCOUNTER — Ambulatory Visit (INDEPENDENT_AMBULATORY_CARE_PROVIDER_SITE_OTHER): Payer: Medicare Other

## 2022-01-22 DIAGNOSIS — Z0000 Encounter for general adult medical examination without abnormal findings: Secondary | ICD-10-CM

## 2022-01-22 NOTE — Patient Instructions (Signed)
Jeff Norton , Thank you for taking time to come for your Medicare Wellness Visit. I appreciate your ongoing commitment to your health goals. Please review the following plan we discussed and let me know if I can assist you in the future.   Screening recommendations/referrals: Colonoscopy: 09/24/2021; due every 10 years Recommended yearly ophthalmology/optometry visit for glaucoma screening and checkup Recommended yearly dental visit for hygiene and checkup  Vaccinations: Influenza vaccine: 03/04/2021 Pneumococcal vaccine: 11/07/2015, 09/24/2020 Tdap vaccine: 07/25/2015; du every 10 years Shingles vaccine: 06/26/2017, 09/04/2017   Covid-19: 08/30/2019, 09/10/2019  Advanced directives: No  Conditions/risks identified: Yes; my goal is to quit smoking  Next appointment: Please schedule your next Medicare Wellness Visit with your Nurse Health Advisor in 1 year by calling [phone].  Preventive Care 40-64 Years, Male Preventive care refers to lifestyle choices and visits with your health care provider that can promote health and wellness. What does preventive care include? A yearly physical exam. This is also called an annual well check. Dental exams once or twice a year. Routine eye exams. Ask your health care provider how often you should have your eyes checked. Personal lifestyle choices, including: Daily care of your teeth and gums. Regular physical activity. Eating a healthy diet. Avoiding tobacco and drug use. Limiting alcohol use. Practicing safe sex. Taking low-dose aspirin every day starting at age 45. What happens during an annual well check? The services and screenings done by your health care provider during your annual well check will depend on your age, overall health, lifestyle risk factors, and family history of disease. Counseling  Your health care provider may ask you questions about your: Alcohol use. Tobacco use. Drug use. Emotional well-being. Home and  relationship well-being. Sexual activity. Eating habits. Work and work Statistician. Screening  You may have the following tests or measurements: Height, weight, and BMI. Blood pressure. Lipid and cholesterol levels. These may be checked every 5 years, or more frequently if you are over 23 years old. Skin check. Lung cancer screening. You may have this screening every year starting at age 36 if you have a 30-pack-year history of smoking and currently smoke or have quit within the past 15 years. Fecal occult blood test (FOBT) of the stool. You may have this test every year starting at age 22. Flexible sigmoidoscopy or colonoscopy. You may have a sigmoidoscopy every 5 years or a colonoscopy every 10 years starting at age 25. Prostate cancer screening. Recommendations will vary depending on your family history and other risks. Hepatitis C blood test. Hepatitis B blood test. Sexually transmitted disease (STD) testing. Diabetes screening. This is done by checking your blood sugar (glucose) after you have not eaten for a while (fasting). You may have this done every 1-3 years. Discuss your test results, treatment options, and if necessary, the need for more tests with your health care provider. Vaccines  Your health care provider may recommend certain vaccines, such as: Influenza vaccine. This is recommended every year. Tetanus, diphtheria, and acellular pertussis (Tdap, Td) vaccine. You may need a Td booster every 10 years. Zoster vaccine. You may need this after age 34. Pneumococcal 13-valent conjugate (PCV13) vaccine. You may need this if you have certain conditions and have not been vaccinated. Pneumococcal polysaccharide (PPSV23) vaccine. You may need one or two doses if you smoke cigarettes or if you have certain conditions. Talk to your health care provider about which screenings and vaccines you need and how often you need them. This information is not intended to  replace advice given to  you by your health care provider. Make sure you discuss any questions you have with your health care provider. Document Released: 06/29/2015 Document Revised: 02/20/2016 Document Reviewed: 04/03/2015 Elsevier Interactive Patient Education  2017 [place] Prevention in the Home Falls can cause injuries. They can happen to people of all ages. There are many things you can do to make your home safe and to help prevent falls. What can I do on the outside of my home? Regularly fix the edges of walkways and driveways and fix any cracks. Remove anything that might make you trip as you walk through a door, such as a raised step or threshold. Trim any bushes or trees on the path to your home. Use bright outdoor lighting. Clear any walking paths of anything that might make someone trip, such as rocks or tools. Regularly check to see if handrails are loose or broken. Make sure that both sides of any steps have handrails. Any raised decks and porches should have guardrails on the edges. Have any leaves, snow, or ice cleared regularly. Use sand or salt on walking paths during winter. Clean up any spills in your garage right away. This includes oil or grease spills. What can I do in the bathroom? Use night lights. Install grab bars by the toilet and in the tub and shower. Do not use towel bars as grab bars. Use non-skid mats or decals in the tub or shower. If you need to sit down in the shower, use a plastic, non-slip stool. Keep the floor dry. Clean up any water that spills on the floor as soon as it happens. Remove soap buildup in the tub or shower regularly. Attach bath mats securely with double-sided non-slip rug tape. Do not have throw rugs and other things on the floor that can make you trip. What can I do in the bedroom? Use night lights. Make sure that you have a light by your bed that is easy to reach. Do not use any sheets or blankets that are too big for your bed. They should not  hang down onto the floor. Have a firm chair that has side arms. You can use this for support while you get dressed. Do not have throw rugs and other things on the floor that can make you trip. What can I do in the kitchen? Clean up any spills right away. Avoid walking on wet floors. Keep items that you use a lot in easy-to-reach places. If you need to reach something above you, use a strong step stool that has a grab bar. Keep electrical cords out of the way. Do not use floor polish or wax that makes floors slippery. If you must use wax, use non-skid floor wax. Do not have throw rugs and other things on the floor that can make you trip. What can I do with my stairs? Do not leave any items on the stairs. Make sure that there are handrails on both sides of the stairs and use them. Fix handrails that are broken or loose. Make sure that handrails are as long as the stairways. Check any carpeting to make sure that it is firmly attached to the stairs. Fix any carpet that is loose or worn. Avoid having throw rugs at the top or bottom of the stairs. If you do have throw rugs, attach them to the floor with carpet tape. Make sure that you have a light switch at the top of the stairs and the  bottom of the stairs. If you do not have them, ask someone to add them for you. What else can I do to help prevent falls? Wear shoes that: Do not have high heels. Have rubber bottoms. Are comfortable and fit you well. Are closed at the toe. Do not wear sandals. If you use a stepladder: Make sure that it is fully opened. Do not climb a closed stepladder. Make sure that both sides of the stepladder are locked into place. Ask someone to hold it for you, if possible. Clearly mark and make sure that you can see: Any grab bars or handrails. First and last steps. Where the edge of each step is. Use tools that help you move around (mobility aids) if they are needed. These  include: Canes. Walkers. Scooters. Crutches. Turn on the lights when you go into a dark area. Replace any light bulbs as soon as they burn out. Set up your furniture so you have a clear path. Avoid moving your furniture around. If any of your floors are uneven, fix them. If there are any pets around you, be aware of where they are. Review your medicines with your doctor. Some medicines can make you feel dizzy. This can increase your chance of falling. Ask your doctor what other things that you can do to help prevent falls. This information is not intended to replace advice given to you by your health care provider. Make sure you discuss any questions you have with your health care provider. Document Released: 03/29/2009 Document Revised: 11/08/2015 Document Reviewed: 07/07/2014 Elsevier Interactive Patient Education  2017 [employer].

## 2022-01-22 NOTE — Progress Notes (Signed)
I connected with Jeff Norton today by telephone and verified that I am speaking with the correct person using two identifiers. Location patient: home Location provider: work Persons participating in the virtual visit: patient, provider.   I discussed the limitations, risks, security and privacy concerns of performing an evaluation and management service by telephone and the availability of in person appointments. I also discussed with the patient that there may be a patient responsible charge related to this service. The patient expressed understanding and verbally consented to this telephonic visit.    Interactive audio and video telecommunications were attempted between this provider and patient, however failed, due to patient having technical difficulties OR patient did not have access to video capability.  We continued and completed visit with audio only.  Some vital signs may be absent or patient reported.   Time Spent with patient on telephone encounter: 30 minutes  Subjective:   Jeff Norton is a 56 y.o. male who presents for Medicare Annual/Subsequent preventive examination.  Review of Systems     Cardiac Risk Factors include: advanced age (>69mn, >>11women);dyslipidemia;family history of premature cardiovascular disease;male gender;smoking/ tobacco exposure     Objective:    There were no vitals filed for this visit. There is no height or weight on file to calculate BMI.     01/22/2022    8:49 AM 01/09/2017    4:15 PM 07/04/2016    8:50 PM 04/10/2014    1:22 PM  Advanced Directives  Does Patient Have a Medical Advance Directive? No No No No  Would patient like information on creating a medical advance directive? No - Patient declined Yes (ED - Information included in AVS) Yes (Inpatient - patient defers creating a medical advance directive at this time) No - patient declined information    Current Medications (verified) Outpatient Encounter Medications as of  01/22/2022  Medication Sig   Albuterol Sulfate (PROAIR RESPICLICK) 1767(90 Base) MCG/ACT AEPB Inhale 1 puff into the lungs 3 (three) times daily as needed.   cholecalciferol (VITAMIN D) 25 MCG (1000 UNIT) tablet SMARTSIG:1 Tablet(s) By Mouth   cyclobenzaprine (FLEXERIL) 10 MG tablet TAKE 1 TABLET(10 MG) BY MOUTH THREE TIMES DAILY AS NEEDED FOR MUSCLE SPASMS   diclofenac (VOLTAREN) 75 MG EC tablet TAKE 1 TABLET BY MOUTH TWICE  DAILY   fluticasone (FLONASE) 50 MCG/ACT nasal spray Place 2 sprays into both nostrils daily.   lamoTRIgine (LAMICTAL) 200 MG tablet Take 1 tablet (200 mg total) by mouth 2 (two) times daily.   LORazepam (ATIVAN) 1 MG tablet TAKE 1 TABLET(1 MG) BY MOUTH TWICE DAILY AS NEEDED FOR ANXIETY   MAGNESIUM-ZINC PO Take by mouth.   modafinil (PROVIGIL) 200 MG tablet TAKE ONE TABLET BY MOUTH ONE TIME DAILY   montelukast (SINGULAIR) 10 MG tablet TAKE 1 TABLET BY MOUTH AT  BEDTIME   Multiple Vitamin (MULTIVITAMIN) tablet Take 1 tablet by mouth daily.   Multiple Vitamins-Minerals (VITAMIN D3 COMPLETE PO) Take by mouth. (Patient not taking: Reported on 09/13/2021)   Omega-3 Fatty Acids (FISH OIL) 1000 MG CAPS Take 2 capsules by mouth daily.   rosuvastatin (CRESTOR) 10 MG tablet TAKE 1 TABLET BY MOUTH DAILY   traMADol (ULTRAM) 50 MG tablet Take 50 mg by mouth every 6 (six) hours as needed.   TRELEGY ELLIPTA 100-62.5-25 MCG/ACT AEPB INHALE 1 PUFF INTO THE LUNGS DAILY   ZYRTEC ALLERGY 10 MG tablet SMARTSIG:1 By Mouth   No facility-administered encounter medications on file as of 01/22/2022.  Allergies (verified) Nicotine polacrilex, Azithromycin, Cleocin [clindamycin hcl], and Oxycodone   History: Past Medical History:  Diagnosis Date   Allergy    Anxiety    Asthma    Bipolar 1 disorder ([place])    Depression    GERD (gastroesophageal reflux disease)    Past Surgical History:  Procedure Laterality Date   CHOLECYSTECTOMY     CYST REMOVAL HAND  1997   FRACTURE SURGERY      GALLBLADDER SURGERY     SPINE SURGERY     Family History  Problem Relation Age of Onset   Hypertension Mother    Mental illness Mother    Diabetes Father    Hyperlipidemia Father    Depression Sister    Diabetes Sister    Hyperlipidemia Sister    Cancer Maternal Grandmother    Mental illness Maternal Grandmother    Social History   Socioeconomic History   Marital status: Divorced    Spouse name: Not on file   Number of children: Not on file   Years of education: Not on file   Highest education level: Not on file  Occupational History   Not on file  Tobacco Use   Smoking status: Every Day    Packs/day: 1.00    Years: 30.00    Total pack years: 30.00    Types: Cigarettes   Smokeless tobacco: Never  Vaping Use   Vaping Use: Every day  Substance and Sexual Activity   Alcohol use: Yes    Alcohol/week: 21.0 standard drinks of alcohol    Types: 21 Cans of beer per week   Drug use: No   Sexual activity: Not Currently  Other Topics Concern   Not on file  Social History Narrative   Not on file   Social Determinants of Health   Financial Resource Strain: Low Risk  (01/22/2022)   Overall Financial Resource Strain (CARDIA)    Difficulty of Paying Living Expenses: Not hard at all  Food Insecurity: No Food Insecurity (01/22/2022)   Hunger Vital Sign    Worried About Running Out of Food in the Last Year: Never true    Ran Out of Food in the Last Year: Never true  Transportation Needs: No Transportation Needs (01/22/2022)   PRAPARE - Hydrologist (Medical): No    Lack of Transportation (Non-Medical): No  Physical Activity: Not on file  Stress: No Stress Concern Present (01/22/2022)   [place]    Feeling of Stress : Not at all  Social Connections: Socially Isolated (01/22/2022)   Social Connection and Isolation Panel [NHANES]    Frequency of Communication with Friends and Family: More  than three times a week    Frequency of Social Gatherings with Friends and Family: More than three times a week    Attends Religious Services: Never    Marine scientist or Organizations: No    Attends Music therapist: Never    Marital Status: Divorced    Tobacco Counseling Ready to quit: Not Answered Counseling given: Not Answered   Clinical Intake:  Pre-visit preparation completed: Yes  Pain : No/denies pain     Nutritional Risks: None Diabetes: No  How often do you need to have someone help you when you read instructions, pamphlets, or other written materials from your doctor or pharmacy?: 1 - Never What is the last grade level you completed in school?: HSG; some college  Diabetic?  no  Interpreter Needed?: No  Information entered by :: Lisette Abu, LPN.   Activities of Daily Living    01/22/2022    8:56 AM  In your present state of health, do you have any difficulty performing the following activities:  Hearing? 0  Vision? 0  Difficulty concentrating or making decisions? 0  Walking or climbing stairs? 0  Dressing or bathing? 0  Doing errands, shopping? 0  Preparing Food and eating ? N  Using the Toilet? N  In the past six months, have you accidently leaked urine? N  Do you have problems with loss of bowel control? N  Managing your Medications? N  Managing your Finances? N  Housekeeping or managing your Housekeeping? N    Patient Care Team: [name], MD as PCP - General (Internal Medicine) [name], [facility] as Pharmacist (Pharmacist)  Indicate any recent Medical Services you may have received from other than [facility] providers in the past year (date may be approximate).     Assessment:   This is a routine wellness examination for [place].  Hearing/Vision screen Hearing Screening - Comments:: Patient denied any hearing difficulty.   No hearing aids.  Vision Screening - Comments:: Patient does wear corrective  lenses/contacts.  Eye exam done by: [facility]    Dietary issues and exercise activities discussed: Current Exercise Habits: The patient has a physically strenuous job, but has no regular exercise apart from work., Exercise limited by: respiratory conditions(s)   Goals Addressed             This Visit's Progress    Quit Smoking        Depression Screen    01/22/2022    8:51 AM 03/04/2021    3:26 PM 10/19/2019    4:01 PM 12/02/2018    3:37 PM 01/09/2017    4:39 PM 07/25/2015    4:24 PM 06/21/2015    1:20 PM  PHQ 2/9 Scores  PHQ - 2 Score '1 1 1 1 2 '$ 0 1  PHQ- 9 Score   '2 3 2      '$ Fall Risk    01/22/2022    8:49 AM 03/04/2021    3:26 PM 10/19/2019    4:01 PM 12/02/2018    3:37 PM 01/09/2017    4:39 PM  [place] in the past year? 0 0 0 0 No  Number falls in past yr: 0 0 0 0   Injury with Fall? 0 0 0 0   Risk for fall due to : No Fall Risks  No Fall Risks    Follow up Falls evaluation completed  Falls evaluation completed Falls evaluation completed     [place]:  Any stairs in or around the home? No  If so, are there any without handrails? No  Home free of loose throw rugs in walkways, pet beds, electrical cords, etc? Yes  Adequate lighting in your home to reduce risk of falls? Yes   ASSISTIVE DEVICES UTILIZED TO PREVENT FALLS:  Life alert? No  Use of a cane, walker or w/c? No  Grab bars in the bathroom? No  Shower chair or bench in shower? No  Elevated toilet seat or a handicapped toilet? No   TIMED UP AND GO:  Was the test performed? No .  Length of time to ambulate 10 feet: n/a sec.   Appearance of gait: Gait not evaluated during this visit.  Cognitive Function:  01/22/2022    8:57 AM  6CIT Screen  What Year? 0 points  What month? 0 points  What time? 0 points  Count back from 20 0 points  Months in reverse 0 points  Repeat phrase 0 points  Total Score 0 points    Immunizations Immunization  History  Administered Date(s) Administered   Fluad Quad(high Dose 65+) 02/28/2019   Influenza,inj,Quad PF,6+ Mos 04/14/2016, 02/19/2017, 02/25/2018, 03/04/2021   Influenza-Unspecified 08/11/2015   PFIZER(Purple Top)SARS-COV-2 Vaccination 08/30/2019, 09/10/2019   PNEUMOCOCCAL CONJUGATE-20 09/24/2020   Pneumococcal Polysaccharide-23 11/07/2015   Tdap 07/25/2015   Zoster Recombinat (Shingrix) 06/26/2017, 09/04/2017    TDAP status: Up to date  Flu Vaccine status: Up to date  Pneumococcal vaccine status: Up to date  Covid-19 vaccine status: Completed vaccines  Qualifies for Shingles Vaccine? Yes   Zostavax completed No   Shingrix Completed?: Yes  Screening Tests Health Maintenance  Topic Date Due   COVID-19 Vaccine (3 - Pfizer series) 11/05/2019   Diabetic kidney evaluation - Urine ACR  12/02/2019   FOOT EXAM  12/02/2019   HEMOGLOBIN A1C  09/01/2021   INFLUENZA VACCINE  01/14/2022   OPHTHALMOLOGY EXAM  01/25/2022   Diabetic kidney evaluation - GFR measurement  09/14/2022   TETANUS/TDAP  07/24/2025   COLONOSCOPY (Pts 45-34yr Insurance coverage will need to be confirmed)  09/25/2031   Hepatitis C Screening  Completed   HIV Screening  Completed   Zoster Vaccines- Shingrix  Completed   HPV VACCINES  Aged Out    Health Maintenance  Health Maintenance Due  Topic Date Due   COVID-19 Vaccine (3 - Pfizer series) 11/05/2019   Diabetic kidney evaluation - Urine ACR  12/02/2019   FOOT EXAM  12/02/2019   HEMOGLOBIN A1C  09/01/2021   INFLUENZA VACCINE  01/14/2022    Colorectal cancer screening: Type of screening: Colonoscopy. Completed 09/24/2021. Repeat every 10 years  Lung Cancer Screening: (Low Dose CT Chest recommended if Age 57-80years, 30 pack-year currently smoking OR have quit w/in 15years.) does qualify.   Lung Cancer Screening Referral: No; already a Pulmonology patient  Additional Screening:  Hepatitis C Screening: does qualify; Completed 01/11/2020  Vision  Screening: Recommended annual ophthalmology exams for early detection of glaucoma and other disorders of the eye. Is the patient up to date with their annual eye exam?  Yes  Who is the provider or what is the name of the [facility] which the patient [facility] If pt is not established with a provider, would they like to be referred to a provider to establish care? No .   Dental Screening: Recommended annual dental exams for proper oral hygiene  Community Resource Referral / Chronic Care Management: CRR required this visit?  No   CCM required this visit?  No      Plan:     I have personally reviewed and noted the following in the patient's chart:   Medical and social history Use of alcohol, tobacco or illicit drugs  Current medications and supplements including opioid prescriptions. Patient is not currently taking opioid prescriptions. Functional ability and status Nutritional status Physical activity Advanced directives List of other physicians Hospitalizations, surgeries, and ER visits in previous 12 months Vitals Screenings to include cognitive, depression, and falls Referrals and appointments  In addition, I have reviewed and discussed with patient certain preventive protocols, quality metrics, and best practice recommendations. A written personalized care plan for preventive services as well as general preventive health recommendations were  provided to patient.     Sheral Flow, LPN   06/20/9468   Nurse Notes:  Patient is cogitatively intact. There were no vitals filed for this visit. There is no height or weight on file to calculate BMI. Patient stated that he has no issues with gait or balance; does not use any assistive devices.

## 2022-03-05 ENCOUNTER — Other Ambulatory Visit: Payer: Self-pay | Admitting: Internal Medicine

## 2022-03-05 DIAGNOSIS — M19071 Primary osteoarthritis, right ankle and foot: Secondary | ICD-10-CM

## 2022-03-05 DIAGNOSIS — M4802 Spinal stenosis, cervical region: Secondary | ICD-10-CM

## 2022-03-10 ENCOUNTER — Ambulatory Visit (INDEPENDENT_AMBULATORY_CARE_PROVIDER_SITE_OTHER): Payer: Medicare Other | Admitting: Internal Medicine

## 2022-03-10 ENCOUNTER — Encounter: Payer: Self-pay | Admitting: Internal Medicine

## 2022-03-10 VITALS — BP 136/80 | HR 68 | Temp 98.3°F | Ht 69.0 in | Wt 222.0 lb

## 2022-03-10 DIAGNOSIS — R351 Nocturia: Secondary | ICD-10-CM | POA: Diagnosis not present

## 2022-03-10 DIAGNOSIS — Z720 Tobacco use: Secondary | ICD-10-CM

## 2022-03-10 DIAGNOSIS — N401 Enlarged prostate with lower urinary tract symptoms: Secondary | ICD-10-CM

## 2022-03-10 DIAGNOSIS — E669 Obesity, unspecified: Secondary | ICD-10-CM

## 2022-03-10 DIAGNOSIS — E785 Hyperlipidemia, unspecified: Secondary | ICD-10-CM | POA: Diagnosis not present

## 2022-03-10 DIAGNOSIS — E118 Type 2 diabetes mellitus with unspecified complications: Secondary | ICD-10-CM | POA: Diagnosis not present

## 2022-03-10 DIAGNOSIS — Z23 Encounter for immunization: Secondary | ICD-10-CM

## 2022-03-10 DIAGNOSIS — Z0000 Encounter for general adult medical examination without abnormal findings: Secondary | ICD-10-CM | POA: Diagnosis not present

## 2022-03-10 DIAGNOSIS — Z0001 Encounter for general adult medical examination with abnormal findings: Secondary | ICD-10-CM

## 2022-03-10 DIAGNOSIS — J449 Chronic obstructive pulmonary disease, unspecified: Secondary | ICD-10-CM

## 2022-03-10 LAB — BASIC METABOLIC PANEL
BUN: 13 mg/dL (ref 6–23)
CO2: 27 mEq/L (ref 19–32)
Calcium: 9.6 mg/dL (ref 8.4–10.5)
Chloride: 101 mEq/L (ref 96–112)
Creatinine, Ser: 0.79 mg/dL (ref 0.40–1.50)
GFR: 98.61 mL/min (ref >60.00)
Glucose, Bld: 119 mg/dL — ABNORMAL HIGH (ref 70–99)
Potassium: 3.9 mEq/L (ref 3.5–5.1)
Sodium: 139 mEq/L (ref 135–145)

## 2022-03-10 LAB — HEPATIC FUNCTION PANEL
ALT: 32 U/L (ref 0–53)
AST: 26 U/L (ref 0–37)
Albumin: 4.8 g/dL (ref 3.5–5.2)
Alkaline Phosphatase: 56 U/L (ref 39–117)
Bilirubin, Direct: 0.2 mg/dL (ref 0.0–0.3)
Total Bilirubin: 1 mg/dL (ref 0.2–1.2)
Total Protein: 7.8 g/dL (ref 6.0–8.3)

## 2022-03-10 LAB — MICROALBUMIN / CREATININE URINE RATIO
Creatinine,U: 132.1 mg/dL
Microalb Creat Ratio: 11.2 mg/g (ref 0.0–30.0)
Microalb, Ur: 14.7 mg/dL — ABNORMAL HIGH (ref 0.0–1.9)

## 2022-03-10 LAB — CBC WITH DIFFERENTIAL/PLATELET
Basophils Absolute: 0.1 10*3/uL (ref 0.0–0.1)
Basophils Relative: 1.3 % (ref 0.0–3.0)
Eosinophils Absolute: 0.4 10*3/uL (ref 0.0–0.7)
Eosinophils Relative: 4.6 % (ref 0.0–5.0)
HCT: 44 % (ref 39.0–52.0)
Hemoglobin: 15.3 g/dL (ref 13.0–17.0)
Lymphocytes Relative: 33.9 % (ref 12.0–46.0)
Lymphs Abs: 2.6 10*3/uL (ref 0.7–4.0)
MCHC: 34.9 g/dL (ref 30.0–36.0)
MCV: 84.8 fl (ref 78.0–100.0)
Monocytes Absolute: 0.6 10*3/uL (ref 0.1–1.0)
Monocytes Relative: 8 % (ref 3.0–12.0)
Neutro Abs: 4 10*3/uL (ref 1.4–7.7)
Neutrophils Relative %: 52.2 % (ref 43.0–77.0)
Platelets: 146 10*3/uL — ABNORMAL LOW (ref 150.0–400.0)
RBC: 5.19 Mil/uL (ref 4.22–5.81)
RDW: 13.3 % (ref 11.5–15.5)
WBC: 7.7 10*3/uL (ref 4.0–10.5)

## 2022-03-10 LAB — LIPID PANEL
Cholesterol: 148 mg/dL (ref 0–200)
HDL: 44.9 mg/dL (ref >39.00)
NonHDL: 102.86
Total CHOL/HDL Ratio: 3
Triglycerides: 281 mg/dL — ABNORMAL HIGH (ref 0.0–149.0)
VLDL: 56.2 mg/dL — ABNORMAL HIGH (ref 0.0–40.0)

## 2022-03-10 LAB — PSA: PSA: 0.73 ng/mL (ref 0.10–4.00)

## 2022-03-10 MED ORDER — VARENICLINE TARTRATE (STARTER) 0.5 MG X 11 & 1 MG X 42 PO TBPK: ORAL_TABLET | ORAL | 0 refills | Status: DC

## 2022-03-10 MED ORDER — NICOTINE POLACRILEX 4 MG MT LOZG: 4.0000 mg | LOZENGE | OROMUCOSAL | 2 refills | Status: DC | PRN

## 2022-03-10 MED ORDER — VARENICLINE TARTRATE 1 MG PO TABS: 1.0000 mg | ORAL_TABLET | Freq: Two times a day (BID) | ORAL | 3 refills | Status: DC

## 2022-03-10 NOTE — Patient Instructions (Signed)
Health Maintenance, Male Adopting a healthy lifestyle and getting preventive care are important in promoting health and wellness. Ask your health care provider about: The right schedule for you to have regular tests and exams. Things you can do on your own to prevent diseases and keep yourself healthy. What should I know about diet, weight, and exercise? Eat a healthy diet  Eat a diet that includes plenty of vegetables, fruits, low-fat dairy products, and lean protein. Do not eat a lot of foods that are high in solid fats, added sugars, or sodium. Maintain a healthy weight Body mass index (BMI) is a measurement that can be used to identify possible weight problems. It estimates body fat based on height and weight. Your health care provider can help determine your BMI and help you achieve or maintain a healthy weight. Get regular exercise Get regular exercise. This is one of the most important things you can do for your health. Most adults should: Exercise for at least 150 minutes each week. The exercise should increase your heart rate and make you sweat (moderate-intensity exercise). Do strengthening exercises at least twice a week. This is in addition to the moderate-intensity exercise. Spend less time sitting. Even light physical activity can be beneficial. Watch cholesterol and blood lipids Have your blood tested for lipids and cholesterol at 57 years of age, then have this test every 5 years. You may need to have your cholesterol levels checked more often if: Your lipid or cholesterol levels are high. You are older than 57 years of age. You are at high risk for heart disease. What should I know about cancer screening? Many types of cancers can be detected early and may often be prevented. Depending on your health history and family history, you may need to have cancer screening at various ages. This may include screening for: Colorectal cancer. Prostate cancer. Skin cancer. Lung  cancer. What should I know about heart disease, diabetes, and high blood pressure? Blood pressure and heart disease High blood pressure causes heart disease and increases the risk of stroke. This is more likely to develop in people who have high blood pressure readings or are overweight. Talk with your health care provider about your target blood pressure readings. Have your blood pressure checked: Every 3-5 years if you are 18-39 years of age. Every year if you are 40 years old or older. If you are between the ages of 65 and 75 and are a current or former smoker, ask your health care provider if you should have a one-time screening for abdominal aortic aneurysm (AAA). Diabetes Have regular diabetes screenings. This checks your fasting blood sugar level. Have the screening done: Once every three years after age 45 if you are at a normal weight and have a low risk for diabetes. More often and at a younger age if you are overweight or have a high risk for diabetes. What should I know about preventing infection? Hepatitis B If you have a higher risk for hepatitis B, you should be screened for this virus. Talk with your health care provider to find out if you are at risk for hepatitis B infection. Hepatitis C Blood testing is recommended for: Everyone born from 1945 through 1965. Anyone with known risk factors for hepatitis C. Sexually transmitted infections (STIs) You should be screened each year for STIs, including gonorrhea and chlamydia, if: You are sexually active and are younger than 57 years of age. You are older than 57 years of age and your   health care provider tells you that you are at risk for this type of infection. Your sexual activity has changed since you were last screened, and you are at increased risk for chlamydia or gonorrhea. Ask your health care provider if you are at risk. Ask your health care provider about whether you are at high risk for HIV. Your health care provider  may recommend a prescription medicine to help prevent HIV infection. If you choose to take medicine to prevent HIV, you should first get tested for HIV. You should then be tested every 3 months for as long as you are taking the medicine. Follow these instructions at home: Alcohol use Do not drink alcohol if your health care provider tells you not to drink. If you drink alcohol: Limit how much you have to 0-2 drinks a day. Know how much alcohol is in your drink. In the [place] one drink equals one 12 oz bottle of beer (355 mL), one 5 oz glass of wine (148 mL), or one 1 oz glass of hard liquor (44 mL). Lifestyle Do not use any products that contain nicotine or tobacco. These products include cigarettes, chewing tobacco, and vaping devices, such as e-cigarettes. If you need help quitting, ask your health care provider. Do not use street drugs. Do not share needles. Ask your health care provider for help if you need support or information about quitting drugs. General instructions Schedule regular health, dental, and eye exams. Stay current with your vaccines. Tell your health care provider if: You often feel depressed. You have ever been abused or do not feel safe at home. Summary Adopting a healthy lifestyle and getting preventive care are important in promoting health and wellness. Follow your health care provider's instructions about healthy diet, exercising, and getting tested or screened for diseases. Follow your health care provider's instructions on monitoring your cholesterol and blood pressure. This information is not intended to replace advice given to you by your health care provider. Make sure you discuss any questions you have with your health care provider. Document Revised: 10/22/2020 Document Reviewed: 10/22/2020 Elsevier Patient Education  2023 [employer]  

## 2022-03-10 NOTE — Progress Notes (Unsigned)
Subjective:  Patient ID: Jeff Norton, male    DOB: July 10, 1965  Age: 56 y.o. MRN: [id number]  CC: Annual Exam, Hyperlipidemia, Diabetes, Hypertension, and COPD   HPI KOUROSH JABLONSKY presents for a CPX and to establish.  Outpatient Medications Prior to Visit  Medication Sig Dispense Refill   Albuterol Sulfate (PROAIR RESPICLICK) 458 (90 Base) MCG/ACT AEPB Inhale 1 puff into the lungs 3 (three) times daily as needed. 1 each 5   cholecalciferol (VITAMIN D) 25 MCG (1000 UNIT) tablet SMARTSIG:1 Tablet(s) By Mouth     cyclobenzaprine (FLEXERIL) 10 MG tablet TAKE 1 TABLET(10 MG) BY MOUTH THREE TIMES DAILY AS NEEDED FOR MUSCLE SPASMS 45 tablet 2   diclofenac (VOLTAREN) 75 MG EC tablet TAKE 1 TABLET BY MOUTH TWICE  DAILY 200 tablet 0   fluticasone (FLONASE) 50 MCG/ACT nasal spray Place 2 sprays into both nostrils daily. 48 g 1   lamoTRIgine (LAMICTAL) 200 MG tablet Take 1 tablet (200 mg total) by mouth 2 (two) times daily. 180 tablet 1   LORazepam (ATIVAN) 1 MG tablet TAKE 1 TABLET(1 MG) BY MOUTH TWICE DAILY AS NEEDED FOR ANXIETY 60 tablet 3   MAGNESIUM-ZINC PO Take by mouth.     modafinil (PROVIGIL) 200 MG tablet TAKE ONE TABLET BY MOUTH ONE TIME DAILY 90 tablet 0   montelukast (SINGULAIR) 10 MG tablet TAKE 1 TABLET BY MOUTH AT  BEDTIME 90 tablet 0   Multiple Vitamin (MULTIVITAMIN) tablet Take 1 tablet by mouth daily.     Multiple Vitamins-Minerals (VITAMIN D3 COMPLETE PO) Take by mouth.     traMADol (ULTRAM) 50 MG tablet Take 50 mg by mouth every 6 (six) hours as needed.     TRELEGY ELLIPTA 100-62.5-25 MCG/ACT AEPB INHALE 1 PUFF INTO THE LUNGS DAILY 120 each 1   ZYRTEC ALLERGY 10 MG tablet SMARTSIG:1 By Mouth     rosuvastatin (CRESTOR) 10 MG tablet TAKE 1 TABLET BY MOUTH DAILY 100 tablet 0   Omega-3 Fatty Acids (FISH OIL) 1000 MG CAPS Take 2 capsules by mouth daily.     No facility-administered medications prior to visit.    ROS Review of Systems  Objective:  BP 136/80 (BP Location:  Right Arm, Patient Position: Sitting, Cuff Size: Large)   Pulse 68   Temp 98.3 F (36.8 C) (Oral)   Ht '5\' 9"'$  (1.753 m)   Wt 222 lb (100.7 kg)   SpO2 96%   BMI 32.78 kg/m   BP Readings from Last 3 Encounters:  03/10/22 136/80  09/13/21 110/78  03/25/21 118/70    Wt Readings from Last 3 Encounters:  03/10/22 222 lb (100.7 kg)  09/13/21 230 lb (104.3 kg)  03/25/21 214 lb (97.1 kg)    Physical Exam Constitutional:      Appearance: He is not ill-appearing.  HENT:     Nose: Nose normal.     Mouth/Throat:     Mouth: Mucous membranes are moist.  Eyes:     General: No scleral icterus.    Conjunctiva/sclera: Conjunctivae normal.  Cardiovascular:     Rate and Rhythm: Normal rate and regular rhythm.     Heart sounds: No murmur heard. Pulmonary:     Effort: Pulmonary effort is normal.     Breath sounds: No stridor. No wheezing, rhonchi or rales.  Abdominal:     General: Abdomen is flat.     Palpations: There is no mass.     Tenderness: There is no abdominal tenderness. There is no guarding or  rebound.     Hernia: No hernia is present. There is no hernia in the left inguinal area or right inguinal area.  Genitourinary:    Pubic Area: No rash.      Penis: Normal.      Testes: Normal.     Epididymis:     Right: Normal.     Left: Normal.     Prostate: Enlarged. Not tender and no nodules present.     Rectum: Normal. Guaiac result negative. No mass, tenderness, anal fissure, external hemorrhoid or internal hemorrhoid. Normal anal tone.  Musculoskeletal:        General: Normal range of motion.     Cervical back: Neck supple.     Right lower leg: No edema.     Left lower leg: No edema.  Lymphadenopathy:     Cervical: No cervical adenopathy.     Lower Body: No right inguinal adenopathy. No left inguinal adenopathy.  Skin:    General: Skin is warm and dry.  Neurological:     General: No focal deficit present.     Mental Status: He is alert. Mental status is at baseline.   Psychiatric:        Mood and Affect: Mood normal.        Behavior: Behavior normal.     Lab Results  Component Value Date   WBC 7.7 03/10/2022   HGB 15.3 03/10/2022   HCT 44.0 03/10/2022   PLT 146.0 (L) 03/10/2022   GLUCOSE 119 (H) 03/10/2022   CHOL 148 03/10/2022   TRIG 281.0 (H) 03/10/2022   HDL 44.90 03/10/2022   LDLDIRECT 73.0 03/10/2022   LDLCALC 51 03/04/2021   ALT 32 03/10/2022   AST 26 03/10/2022   NA 139 03/10/2022   K 3.9 03/10/2022   CL 101 03/10/2022   CREATININE 0.79 03/10/2022   BUN 13 03/10/2022   CO2 27 03/10/2022   TSH 1.54 03/10/2022   PSA 0.73 03/10/2022   INR 1.0 01/11/2020   HGBA1C 6.6 (H) 03/10/2022   MICROALBUR 14.7 (H) 03/10/2022    CT CHEST LUNG CA SCREEN LOW DOSE W/O CM  Addendum Date: 10/22/2021   ADDENDUM REPORT: 10/22/2021 08:06 ADDENDUM: Corrected report: Original report was generated with an error, corrected as follows: Largest nodule is located in the left lower lobe and measures 4.0 mm in mean diameter on image 149. Electronically Signed   By: Yetta Glassman M.D.   On: 10/22/2021 08:06   Result Date: 10/22/2021 CLINICAL DATA:  Current smoker with 44 pack-year history EXAM: CT CHEST WITHOUT CONTRAST LOW-DOSE FOR LUNG CANCER SCREENING TECHNIQUE: Multidetector CT imaging of the chest was performed following the standard protocol without IV contrast. RADIATION DOSE REDUCTION: This exam was performed according to the departmental dose-optimization program which includes automated exposure control, adjustment of the mA and/or kV according to patient size and/or use of iterative reconstruction technique. COMPARISON:  Lung cancer screening CT dated Oct 17, 2020 FINDINGS: Cardiovascular: Normal heart size. No pericardial effusion. Lad and RCA coronary artery calcifications. Atherosclerotic disease of the thoracic aorta. Mediastinum/Nodes: Esophagus and thyroid are unremarkable. No pathologically enlarged lymph nodes seen in the chest. Lungs/Pleura:  Central airways are patent. Mild centrilobular emphysema. No consolidation, pleural effusion or pneumothorax. Stable small bilateral solid pulmonary nodules. Largest nodule is located in the left lower lobe and measures 4.0 cm in mean diameter on image 149. Upper Abdomen: Hepatic steatosis.  No acute abnormality. Musculoskeletal: No chest wall mass or suspicious bone lesions identified. IMPRESSION: 1. Lung-RADS  2, benign appearance or behavior. Continue annual screening with low-dose chest CT without contrast in 12 months. 2. Coronary artery calcifications, aortic Atherosclerosis (ICD10-I70.0) and Emphysema (ICD10-J43.9). Electronically Signed: By: Yetta Glassman M.D. On: 10/20/2021 19:15   Assessment & Plan:   Aariz was seen today for annual exam, hyperlipidemia, diabetes, hypertension and copd.  Diagnoses and all orders for this visit:  Type II diabetes mellitus with manifestations ([place]) -     Hemoglobin A1c; Future -     Microalbumin / creatinine urine ratio; Future -     Basic metabolic panel; Future -     Basic metabolic panel -     Microalbumin / creatinine urine ratio -     Hemoglobin A1c -     HM Diabetes Foot Exam  Encounter for general adult medical examination with abnormal findings  Obesity (BMI 30.0-34.9)  Nocturia associated with benign prostatic hyperplasia -     Urinalysis, Routine w reflex microscopic; Future -     PSA; Future -     PSA -     Urinalysis, Routine w reflex microscopic  Hyperlipidemia LDL goal <100 -     Lipid panel; Future -     TSH; Future -     Hepatic function panel; Future -     Hepatic function panel -     TSH -     Lipid panel -     rosuvastatin (CRESTOR) 10 MG tablet; Take 1 tablet (10 mg total) by mouth daily.  Continuous tobacco abuse -     varenicline (CHANTIX CONTINUING MONTH PAK) 1 MG tablet; Take 1 tablet (1 mg total) by mouth 2 (two) times daily. -     Varenicline Tartrate, Starter, (CHANTIX STARTING MONTH PAK) 0.5 MG X 11 & 1 MG X  42 TBPK; Take one 0.5 mg tablet by mouth once daily for 3 days, then increase to one 0.5 mg tablet twice daily for 4 days, then increase to one 1 mg tablet twice daily. -     nicotine polacrilex (NICOTINE MINI) 4 MG lozenge; Take 1 lozenge (4 mg total) by mouth as needed for smoking cessation.  COPD with asthma ([place]) -     CBC with Differential/Platelet; Future -     CBC with Differential/Platelet  Other orders -     Flu Vaccine QUAD 6+ mos PF IM (Fluarix Quad PF) -     LDL cholesterol, direct   I have discontinued Bre[employer] M[employer]'s Fish Oil. I have also changed his rosuvastatin. Additionally, I am having him start on varenicline, Varenicline Tartrate (Starter), and nicotine polacrilex. Lastly, I am having him maintain his lamoTRIgine, multivitamin, MAGNESIUM-ZINC PO, Multiple Vitamins-Minerals (VITAMIN D3 COMPLETE PO), fluticasone, ProAir RespiClick, modafinil, montelukast, ZyrTEC Allergy, cholecalciferol, traMADol, cyclobenzaprine, Trelegy Ellipta, LORazepam, and diclofenac.  Meds ordered this encounter  Medications   varenicline (CHANTIX CONTINUING MONTH PAK) 1 MG tablet    Sig: Take 1 tablet (1 mg total) by mouth 2 (two) times daily.    Dispense:  60 tablet    Refill:  3   Varenicline Tartrate, Starter, (CHANTIX STARTING MONTH PAK) 0.5 MG X 11 & 1 MG X 42 TBPK    Sig: Take one 0.5 mg tablet by mouth once daily for 3 days, then increase to one 0.5 mg tablet twice daily for 4 days, then increase to one 1 mg tablet twice daily.    Dispense:  53 each    Refill:  0   nicotine polacrilex (NICOTINE MINI) 4  MG lozenge    Sig: Take 1 lozenge (4 mg total) by mouth as needed for smoking cessation.    Dispense:  108 tablet    Refill:  2   rosuvastatin (CRESTOR) 10 MG tablet    Sig: Take 1 tablet (10 mg total) by mouth daily.    Dispense:  100 tablet    Refill:  1     Follow-up: Return in about 6 months (around 09/08/2022).  Scarlette Calico, MD

## 2022-03-11 LAB — URINALYSIS, ROUTINE W REFLEX MICROSCOPIC
Bilirubin Urine: NEGATIVE
Hgb urine dipstick: NEGATIVE
Ketones, ur: NEGATIVE
Leukocytes,Ua: NEGATIVE
Nitrite: NEGATIVE
Specific Gravity, Urine: 1.02 (ref 1.000–1.030)
Urine Glucose: NEGATIVE
Urobilinogen, UA: 0.2 (ref 0.0–1.0)
pH: 6.5 (ref 5.0–8.0)

## 2022-03-11 LAB — LDL CHOLESTEROL, DIRECT: Direct LDL: 73 mg/dL

## 2022-03-11 LAB — HEMOGLOBIN A1C: Hgb A1c MFr Bld: 6.6 % — ABNORMAL HIGH (ref 4.6–6.5)

## 2022-03-11 LAB — TSH: TSH: 1.54 u[IU]/mL (ref 0.35–5.50)

## 2022-03-11 MED ORDER — ROSUVASTATIN CALCIUM 10 MG PO TABS: 10.0000 mg | ORAL_TABLET | Freq: Every day | ORAL | 1 refills | Status: DC

## 2022-03-17 ENCOUNTER — Other Ambulatory Visit: Payer: Self-pay | Admitting: Internal Medicine

## 2022-03-17 DIAGNOSIS — M4802 Spinal stenosis, cervical region: Secondary | ICD-10-CM

## 2022-03-29 ENCOUNTER — Other Ambulatory Visit: Payer: Self-pay | Admitting: Internal Medicine

## 2022-03-29 DIAGNOSIS — E785 Hyperlipidemia, unspecified: Secondary | ICD-10-CM

## 2022-04-14 ENCOUNTER — Encounter: Payer: Self-pay | Admitting: Internal Medicine

## 2022-04-19 ENCOUNTER — Other Ambulatory Visit: Payer: Self-pay | Admitting: Internal Medicine

## 2022-04-19 DIAGNOSIS — J301 Allergic rhinitis due to pollen: Secondary | ICD-10-CM

## 2022-04-22 DIAGNOSIS — M542 Cervicalgia: Secondary | ICD-10-CM | POA: Diagnosis not present

## 2022-04-23 ENCOUNTER — Encounter: Payer: Self-pay | Admitting: Internal Medicine

## 2022-04-23 DIAGNOSIS — J4489 Other specified chronic obstructive pulmonary disease: Secondary | ICD-10-CM

## 2022-04-26 ENCOUNTER — Other Ambulatory Visit: Payer: Self-pay | Admitting: Internal Medicine

## 2022-04-26 DIAGNOSIS — J4489 Other specified chronic obstructive pulmonary disease: Secondary | ICD-10-CM

## 2022-05-01 DIAGNOSIS — S161XXD Strain of muscle, fascia and tendon at neck level, subsequent encounter: Secondary | ICD-10-CM | POA: Diagnosis not present

## 2022-05-14 DIAGNOSIS — S161XXD Strain of muscle, fascia and tendon at neck level, subsequent encounter: Secondary | ICD-10-CM | POA: Diagnosis not present

## 2022-05-15 DIAGNOSIS — E119 Type 2 diabetes mellitus without complications: Secondary | ICD-10-CM | POA: Diagnosis not present

## 2022-05-15 DIAGNOSIS — H5213 Myopia, bilateral: Secondary | ICD-10-CM | POA: Diagnosis not present

## 2022-05-15 DIAGNOSIS — H40053 Ocular hypertension, bilateral: Secondary | ICD-10-CM | POA: Diagnosis not present

## 2022-05-15 LAB — HM DIABETES EYE EXAM

## 2022-06-29 ENCOUNTER — Other Ambulatory Visit: Payer: Self-pay | Admitting: Internal Medicine

## 2022-06-29 DIAGNOSIS — F411 Generalized anxiety disorder: Secondary | ICD-10-CM

## 2022-07-01 ENCOUNTER — Other Ambulatory Visit: Payer: Self-pay | Admitting: Internal Medicine

## 2022-07-01 DIAGNOSIS — J4489 Other specified chronic obstructive pulmonary disease: Secondary | ICD-10-CM

## 2022-07-10 DIAGNOSIS — D1801 Hemangioma of skin and subcutaneous tissue: Secondary | ICD-10-CM | POA: Diagnosis not present

## 2022-07-10 DIAGNOSIS — D2361 Other benign neoplasm of skin of right upper limb, including shoulder: Secondary | ICD-10-CM | POA: Diagnosis not present

## 2022-07-10 DIAGNOSIS — Z85828 Personal history of other malignant neoplasm of skin: Secondary | ICD-10-CM | POA: Diagnosis not present

## 2022-07-10 DIAGNOSIS — L814 Other melanin hyperpigmentation: Secondary | ICD-10-CM | POA: Diagnosis not present

## 2022-07-10 DIAGNOSIS — L821 Other seborrheic keratosis: Secondary | ICD-10-CM | POA: Diagnosis not present

## 2022-07-10 DIAGNOSIS — D225 Melanocytic nevi of trunk: Secondary | ICD-10-CM | POA: Diagnosis not present

## 2022-08-13 ENCOUNTER — Other Ambulatory Visit: Payer: Self-pay | Admitting: Internal Medicine

## 2022-08-13 DIAGNOSIS — M4802 Spinal stenosis, cervical region: Secondary | ICD-10-CM

## 2022-08-19 ENCOUNTER — Other Ambulatory Visit: Payer: Self-pay | Admitting: Internal Medicine

## 2022-08-19 DIAGNOSIS — J301 Allergic rhinitis due to pollen: Secondary | ICD-10-CM

## 2022-08-19 DIAGNOSIS — E785 Hyperlipidemia, unspecified: Secondary | ICD-10-CM

## 2022-08-30 ENCOUNTER — Other Ambulatory Visit: Payer: Self-pay | Admitting: Internal Medicine

## 2022-08-30 DIAGNOSIS — J4489 Other specified chronic obstructive pulmonary disease: Secondary | ICD-10-CM

## 2022-08-30 DIAGNOSIS — J4520 Mild intermittent asthma, uncomplicated: Secondary | ICD-10-CM

## 2022-09-04 ENCOUNTER — Encounter: Payer: Self-pay | Admitting: Internal Medicine

## 2022-09-05 ENCOUNTER — Other Ambulatory Visit (HOSPITAL_COMMUNITY): Payer: Self-pay

## 2022-09-05 ENCOUNTER — Telehealth: Payer: Self-pay

## 2022-09-05 NOTE — Telephone Encounter (Signed)
Patient Advocate Encounter  Prior Authorization for ProAir RespiClick [id number] (90 Base)MCG/ACT aerosol powder has been approved.    PA# [id number] Effective dates: 09/05/22 through 06/16/23

## 2022-09-05 NOTE — Telephone Encounter (Signed)
Pharmacy Patient Advocate Encounter   Received notification that prior authorization for ProAir RespiClick [id number] (90 Base)MCG/ACT aerosol powder is required/requested.  Per Test Claim: Product/service not covered. Albuterol (Proair); Ventolin; Levalbuterol pref'd   PA submitted on 09/05/22 to (ins) [employer] via CoverMyMeds Key or (Medicaid) confirmation # [id number] Status is pending

## 2022-09-06 ENCOUNTER — Other Ambulatory Visit: Payer: Self-pay | Admitting: Internal Medicine

## 2022-09-06 DIAGNOSIS — J4489 Other specified chronic obstructive pulmonary disease: Secondary | ICD-10-CM

## 2022-09-22 ENCOUNTER — Other Ambulatory Visit: Payer: Self-pay | Admitting: Internal Medicine

## 2022-10-12 ENCOUNTER — Other Ambulatory Visit: Payer: Self-pay | Admitting: Internal Medicine

## 2022-10-12 DIAGNOSIS — J301 Allergic rhinitis due to pollen: Secondary | ICD-10-CM

## 2022-10-20 ENCOUNTER — Ambulatory Visit
Admission: RE | Admit: 2022-10-20 | Discharge: 2022-10-20 | Disposition: A | Discharge status: Home / Self Care | Payer: Medicare Other | Source: Ambulatory Visit | Attending: Acute Care | Admitting: Acute Care

## 2022-10-20 DIAGNOSIS — F17200 Nicotine dependence, unspecified, uncomplicated: Secondary | ICD-10-CM | POA: Diagnosis not present

## 2022-10-20 DIAGNOSIS — J439 Emphysema, unspecified: Secondary | ICD-10-CM | POA: Diagnosis not present

## 2022-10-20 DIAGNOSIS — Z87891 Personal history of nicotine dependence: Secondary | ICD-10-CM

## 2022-10-20 DIAGNOSIS — I700 Atherosclerosis of aorta: Secondary | ICD-10-CM | POA: Diagnosis not present

## 2022-10-20 DIAGNOSIS — F17210 Nicotine dependence, cigarettes, uncomplicated: Secondary | ICD-10-CM

## 2022-10-20 DIAGNOSIS — Z122 Encounter for screening for malignant neoplasm of respiratory organs: Secondary | ICD-10-CM

## 2022-10-24 ENCOUNTER — Other Ambulatory Visit: Payer: Self-pay | Admitting: Acute Care

## 2022-10-24 DIAGNOSIS — Z122 Encounter for screening for malignant neoplasm of respiratory organs: Secondary | ICD-10-CM

## 2022-10-24 DIAGNOSIS — F17210 Nicotine dependence, cigarettes, uncomplicated: Secondary | ICD-10-CM

## 2022-10-24 DIAGNOSIS — Z87891 Personal history of nicotine dependence: Secondary | ICD-10-CM

## 2022-10-31 ENCOUNTER — Other Ambulatory Visit: Payer: Self-pay | Admitting: Internal Medicine

## 2022-10-31 DIAGNOSIS — M4802 Spinal stenosis, cervical region: Secondary | ICD-10-CM

## 2022-10-31 DIAGNOSIS — M19071 Primary osteoarthritis, right ankle and foot: Secondary | ICD-10-CM

## 2022-11-12 ENCOUNTER — Ambulatory Visit (INDEPENDENT_AMBULATORY_CARE_PROVIDER_SITE_OTHER): Payer: Medicare Other | Admitting: Internal Medicine

## 2022-11-12 ENCOUNTER — Encounter: Payer: Self-pay | Admitting: Internal Medicine

## 2022-11-12 VITALS — BP 122/74 | HR 74 | Temp 98.2°F | Resp 16 | Ht 69.0 in | Wt 210.0 lb

## 2022-11-12 DIAGNOSIS — M19071 Primary osteoarthritis, right ankle and foot: Secondary | ICD-10-CM | POA: Diagnosis not present

## 2022-11-12 DIAGNOSIS — G4730 Sleep apnea, unspecified: Secondary | ICD-10-CM | POA: Diagnosis not present

## 2022-11-12 DIAGNOSIS — D696 Thrombocytopenia, unspecified: Secondary | ICD-10-CM | POA: Diagnosis not present

## 2022-11-12 DIAGNOSIS — F3176 Bipolar disorder, in full remission, most recent episode depressed: Secondary | ICD-10-CM

## 2022-11-12 DIAGNOSIS — J4489 Other specified chronic obstructive pulmonary disease: Secondary | ICD-10-CM | POA: Diagnosis not present

## 2022-11-12 DIAGNOSIS — E785 Hyperlipidemia, unspecified: Secondary | ICD-10-CM

## 2022-11-12 DIAGNOSIS — E118 Type 2 diabetes mellitus with unspecified complications: Secondary | ICD-10-CM | POA: Diagnosis not present

## 2022-11-12 DIAGNOSIS — G4710 Hypersomnia, unspecified: Secondary | ICD-10-CM | POA: Diagnosis not present

## 2022-11-12 DIAGNOSIS — M4802 Spinal stenosis, cervical region: Secondary | ICD-10-CM | POA: Diagnosis not present

## 2022-11-12 DIAGNOSIS — F411 Generalized anxiety disorder: Secondary | ICD-10-CM

## 2022-11-12 DIAGNOSIS — J301 Allergic rhinitis due to pollen: Secondary | ICD-10-CM | POA: Diagnosis not present

## 2022-11-12 DIAGNOSIS — J4520 Mild intermittent asthma, uncomplicated: Secondary | ICD-10-CM | POA: Diagnosis not present

## 2022-11-12 MED ORDER — MONTELUKAST SODIUM 10 MG PO TABS
10.0000 mg | ORAL_TABLET | Freq: Every day | ORAL | 0 refills | Status: DC
Start: 2022-11-12 — End: 2023-01-07

## 2022-11-12 MED ORDER — LORAZEPAM 1 MG PO TABS
1.0000 mg | ORAL_TABLET | Freq: Two times a day (BID) | ORAL | 3 refills | Status: DC | PRN
Start: 2022-11-12 — End: 2023-05-28

## 2022-11-12 MED ORDER — PROAIR RESPICLICK 108 (90 BASE) MCG/ACT IN AEPB
INHALATION_SPRAY | RESPIRATORY_TRACT | 5 refills | Status: AC
Start: 2022-11-12 — End: ?

## 2022-11-12 MED ORDER — MODAFINIL 200 MG PO TABS: 200.0000 mg | ORAL_TABLET | Freq: Every day | ORAL | 0 refills | Status: DC

## 2022-11-12 MED ORDER — ROSUVASTATIN CALCIUM 10 MG PO TABS
10.0000 mg | ORAL_TABLET | Freq: Every day | ORAL | 0 refills | Status: DC
Start: 2022-11-12 — End: 2023-02-02

## 2022-11-12 MED ORDER — CYCLOBENZAPRINE HCL 10 MG PO TABS
ORAL_TABLET | ORAL | 0 refills | Status: DC
Start: 2022-11-12 — End: 2023-07-20

## 2022-11-12 MED ORDER — LAMOTRIGINE 200 MG PO TABS
200.0000 mg | ORAL_TABLET | Freq: Two times a day (BID) | ORAL | 0 refills | Status: DC
Start: 2022-11-12 — End: 2023-02-12

## 2022-11-12 MED ORDER — DICLOFENAC SODIUM 75 MG PO TBEC: 75.0000 mg | DELAYED_RELEASE_TABLET | Freq: Two times a day (BID) | ORAL | 0 refills | Status: DC

## 2022-11-12 NOTE — Progress Notes (Signed)
Subjective:  Patient ID: Jeff Norton, male    DOB: [birth year]  Age: 57 y.o. MRN: [id number]  CC: Diabetes, Hyperlipidemia, and COPD   HPI Jeff Norton presents for f/up --  He walks 7000 steps per day.  He has good endurance and denies chest pain, diaphoresis, or edema.  He complains of persistent cough that is intermittently productive of yellow phlegm.  Outpatient Medications Prior to Visit  Medication Sig Dispense Refill   cholecalciferol (VITAMIN D) 25 MCG (1000 UNIT) tablet SMARTSIG:1 Tablet(s) By Mouth     fluticasone (FLONASE) 50 MCG/ACT nasal spray Place 2 sprays into both nostrils daily. 48 g 1   MAGNESIUM-ZINC PO Take by mouth.     Multiple Vitamin (MULTIVITAMIN) tablet Take 1 tablet by mouth daily.     Multiple Vitamins-Minerals (VITAMIN D3 COMPLETE PO) Take by mouth.     traMADol (ULTRAM) 50 MG tablet Take 50 mg by mouth every 6 (six) hours as needed.     TRELEGY ELLIPTA 100-62.5-25 MCG/ACT AEPB INHALE 1 PUFF INTO THE LUNGS DAILY 120 each 0   ZYRTEC ALLERGY 10 MG tablet SMARTSIG:1 By Mouth     cyclobenzaprine (FLEXERIL) 10 MG tablet TAKE 1 TABLET(10 MG) BY MOUTH THREE TIMES DAILY AS NEEDED FOR MUSCLE SPASMS 270 tablet 0   diclofenac (VOLTAREN) 75 MG EC tablet TAKE 1 TABLET BY MOUTH TWICE  DAILY 200 tablet 0   lamoTRIgine (LAMICTAL) 200 MG tablet Take 1 tablet (200 mg total) by mouth 2 (two) times daily. 180 tablet 1   LORazepam (ATIVAN) 1 MG tablet TAKE 1 TABLET(1 MG) BY MOUTH TWICE DAILY AS NEEDED FOR ANXIETY 60 tablet 3   modafinil (PROVIGIL) 200 MG tablet TAKE ONE TABLET BY MOUTH ONE TIME DAILY 90 tablet 0   montelukast (SINGULAIR) 10 MG tablet TAKE 1 TABLET BY MOUTH AT  BEDTIME 90 tablet 0   nicotine polacrilex (NICOTINE MINI) 4 MG lozenge Take 1 lozenge (4 mg total) by mouth as needed for smoking cessation. 108 tablet 2   PROAIR RESPICLICK 108 (90 Base) MCG/ACT AEPB INHALE 1 PUFF INTO THE LUNGS THREE TIMES DAILY AS NEEDED 1 each 5   rosuvastatin (CRESTOR) 10 MG  tablet TAKE 1 TABLET BY MOUTH DAILY 100 tablet 1   varenicline (CHANTIX CONTINUING MONTH PAK) 1 MG tablet Take 1 tablet (1 mg total) by mouth 2 (two) times daily. 60 tablet 3   Varenicline Tartrate, Starter, (CHANTIX STARTING MONTH PAK) 0.5 MG X 11 & 1 MG X 42 TBPK Take one 0.5 mg tablet by mouth once daily for 3 days, then increase to one 0.5 mg tablet twice daily for 4 days, then increase to one 1 mg tablet twice daily. 53 each 0   No facility-administered medications prior to visit.    ROS Review of Systems  Constitutional:  Negative for chills, diaphoresis, fatigue and fever.  HENT:  Positive for rhinorrhea. Negative for nosebleeds and sinus pressure.   Eyes: Negative.   Respiratory:  Positive for apnea, cough and shortness of breath. Negative for chest tightness and wheezing.   Cardiovascular:  Negative for chest pain, palpitations and leg swelling.  Gastrointestinal:  Negative for abdominal pain, constipation, diarrhea, nausea and vomiting.  Endocrine: Negative.   Genitourinary: Negative.  Negative for difficulty urinating, dysuria and hematuria.  Musculoskeletal:  Positive for neck pain. Negative for arthralgias, joint swelling and myalgias.  Skin: Negative.   Neurological:  Negative for dizziness, weakness and headaches.  Hematological:  Negative for adenopathy. Does not bruise/bleed  easily.  Psychiatric/Behavioral:  Negative for decreased concentration, dysphoric mood, sleep disturbance and suicidal ideas. The patient is nervous/anxious.     Objective:  BP 122/74 (BP Location: Left Arm, Patient Position: Sitting, Cuff Size: Large)   Pulse 74   Temp 98.2 F (36.8 C) (Oral)   Resp 16   Ht 5\' 9"  (1.753 m)   Wt 210 lb (95.3 kg)   SpO2 93%   BMI 31.01 kg/m   BP Readings from Last 3 Encounters:  11/12/22 122/74  03/10/22 136/80  09/13/21 110/78    Wt Readings from Last 3 Encounters:  11/12/22 210 lb (95.3 kg)  03/10/22 222 lb (100.7 kg)  09/13/21 230 lb (104.3 kg)     Physical Exam Vitals reviewed.  Constitutional:      Appearance: Normal appearance.  HENT:     Mouth/Throat:     Mouth: Mucous membranes are moist.  Eyes:     General: No scleral icterus.    Conjunctiva/sclera: Conjunctivae normal.  Cardiovascular:     Rate and Rhythm: Normal rate and regular rhythm.     Heart sounds: No murmur heard.    No friction rub. No gallop.  Pulmonary:     Effort: Pulmonary effort is normal.     Breath sounds: No stridor. No wheezing, rhonchi or rales.  Abdominal:     General: Abdomen is flat.     Palpations: There is no mass.     Tenderness: There is no abdominal tenderness. There is no guarding.     Hernia: No hernia is present.  Musculoskeletal:        General: Normal range of motion.     Cervical back: Neck supple.     Right lower leg: No edema.     Left lower leg: No edema.  Lymphadenopathy:     Cervical: No cervical adenopathy.  Skin:    General: Skin is warm and dry.  Neurological:     General: No focal deficit present.     Mental Status: He is alert. Mental status is at baseline.  Psychiatric:        Mood and Affect: Mood normal.        Behavior: Behavior normal.     Lab Results  Component Value Date   WBC 8.2 11/17/2022   HGB 15.1 11/17/2022   HCT 44.7 11/17/2022   PLT 176.0 11/17/2022   GLUCOSE 151 (H) 11/17/2022   CHOL 142 11/17/2022   TRIG 155.0 (H) 11/17/2022   HDL 48.20 11/17/2022   LDLDIRECT 73.0 03/10/2022   LDLCALC 63 11/17/2022   ALT 32 03/10/2022   AST 26 03/10/2022   NA 140 11/17/2022   K 4.4 11/17/2022   CL 104 11/17/2022   CREATININE 0.96 11/17/2022   BUN 20 11/17/2022   CO2 26 11/17/2022   TSH 1.54 03/10/2022   PSA 0.73 03/10/2022   INR 1.0 01/11/2020   HGBA1C 6.5 11/17/2022   MICROALBUR 14.7 (H) 03/10/2022    CT CHEST LUNG CA SCREEN LOW DOSE W/O CM  Result Date: 10/23/2022 CLINICAL DATA:  57 year old male current smoker with 45 pack-year smoking history. EXAM: CT CHEST WITHOUT CONTRAST LOW-DOSE  FOR LUNG CANCER SCREENING TECHNIQUE: Multidetector CT imaging of the chest was performed following the standard protocol without IV contrast. RADIATION DOSE REDUCTION: This exam was performed according to the departmental dose-optimization program which includes automated exposure control, adjustment of the mA and/or kV according to patient size and/or use of iterative reconstruction technique. COMPARISON:  10/18/2021 screening chest CT.  FINDINGS: Cardiovascular: Normal heart size. No significant pericardial effusion/thickening. Three-vessel coronary atherosclerosis. Atherosclerotic nonaneurysmal thoracic aorta. Normal caliber pulmonary arteries. Mediastinum/Nodes: No significant thyroid nodules. Unremarkable esophagus. No pathologically enlarged axillary, mediastinal or hilar lymph nodes, noting limited sensitivity for the detection of hilar adenopathy on this noncontrast study. Lungs/Pleura: No pneumothorax. No pleural effusion. Mild centrilobular emphysema with diffuse bronchial wall thickening. No acute consolidative airspace disease or lung masses. No significant growth of previously visualized numerous scattered small bilateral solid pulmonary nodules. No new significant pulmonary nodules. Upper abdomen: Mild diffuse hepatic steatosis.  Cholecystectomy. Musculoskeletal: No aggressive appearing focal osseous lesions. Moderate thoracic spondylosis. Partially visualized surgical hardware from ACDF. IMPRESSION: 1. Lung-RADS 2, benign appearance or behavior. Continue annual screening with low-dose chest CT without contrast in 12 months. 2. Three-vessel coronary atherosclerosis. 3. Mild diffuse hepatic steatosis. 4. Aortic Atherosclerosis (ICD10-I70.0) and Emphysema (ICD10-J43.9). Electronically Signed   By: Delbert Phenix M.D.   On: 10/23/2022 22:44   Assessment & Plan:   Type II diabetes mellitus with manifestations (H[facility])-his blood sugar is well-controlled. -     Basic metabolic panel; Future -     Hemoglobin  A1c; Future  Thrombocytopenia ([facility]CC)-his platelets are normal now. -     CBC with Differential/Platelet; Future -     Vitamin B12; Future -     Folate; Future  Spinal stenosis in cervical region -     Cyclobenzaprine HCl; TAKE 1 TABLET(10 MG) BY MOUTH THREE TIMES DAILY AS NEEDED FOR MUSCLE SPASMS  Dispense: 270 tablet; Refill: 0 -     Diclofenac Sodium; Take 1 tablet (75 mg total) by mouth 2 (two) times daily.  Dispense: 200 tablet; Refill: 0  Bipolar 1 disorder, depressed, full remission ([facility]) -     lamoTRIgine; Take 1 tablet (200 mg total) by mouth 2 (two) times daily.  Dispense: 180 tablet; Refill: 0  Sleep apnea with hypersomnolence -     Modafinil; Take 1 tablet (200 mg total) by mouth daily.  Dispense: 90 tablet; Refill: 0  Hyperlipidemia LDL goal <100 -     Rosuvastatin Calcium; Take 1 tablet (10 mg total) by mouth daily.  Dispense: 100 tablet; Refill: 0 -     Lipid panel; Future  COPD with asthma -     ProAir RespiClick; INHALE 1 PUFF INTO THE LUNGS THREE TIMES DAILY AS NEEDED  Dispense: 1 each; Refill: 5  Asthma, mild intermittent, poorly controlled -     ProAir RespiClick; INHALE 1 PUFF INTO THE LUNGS THREE TIMES DAILY AS NEEDED  Dispense: 1 each; Refill: 5  GAD (generalized anxiety disorder) -     LORazepam; Take 1 tablet (1 mg total) by mouth 2 (two) times daily as needed for anxiety.  Dispense: 60 tablet; Refill: 3  Osteoarthritis of right ankle and foot -     Diclofenac Sodium; Take 1 tablet (75 mg total) by mouth 2 (two) times daily.  Dispense: 200 tablet; Refill: 0  Acute seasonal allergic rhinitis due to pollen -     Montelukast Sodium; Take 1 tablet (10 mg total) by mouth at bedtime.  Dispense: 90 tablet; Refill: 0     Follow-up: No follow-ups on file.  Sanda Linger, MD

## 2022-11-17 ENCOUNTER — Other Ambulatory Visit (INDEPENDENT_AMBULATORY_CARE_PROVIDER_SITE_OTHER): Payer: Medicare Other

## 2022-11-17 DIAGNOSIS — H40053 Ocular hypertension, bilateral: Secondary | ICD-10-CM | POA: Diagnosis not present

## 2022-11-17 DIAGNOSIS — E785 Hyperlipidemia, unspecified: Secondary | ICD-10-CM

## 2022-11-17 DIAGNOSIS — D696 Thrombocytopenia, unspecified: Secondary | ICD-10-CM

## 2022-11-17 DIAGNOSIS — E118 Type 2 diabetes mellitus with unspecified complications: Secondary | ICD-10-CM

## 2022-11-17 LAB — CBC WITH DIFFERENTIAL/PLATELET
Basophils Absolute: 0.1 10*3/uL (ref 0.0–0.1)
Basophils Relative: 1.1 % (ref 0.0–3.0)
Eosinophils Absolute: 0.2 10*3/uL (ref 0.0–0.7)
Eosinophils Relative: 2.7 % (ref 0.0–5.0)
HCT: 44.7 % (ref 39.0–52.0)
Hemoglobin: 15.1 g/dL (ref 13.0–17.0)
Lymphocytes Relative: 31.7 % (ref 12.0–46.0)
Lymphs Abs: 2.6 10*3/uL (ref 0.7–4.0)
MCHC: 33.7 g/dL (ref 30.0–36.0)
MCV: 86.4 fl (ref 78.0–100.0)
Monocytes Absolute: 0.6 10*3/uL (ref 0.1–1.0)
Monocytes Relative: 7.1 % (ref 3.0–12.0)
Neutro Abs: 4.7 10*3/uL (ref 1.4–7.7)
Neutrophils Relative %: 57.4 % (ref 43.0–77.0)
Platelets: 176 10*3/uL (ref 150.0–400.0)
RBC: 5.17 Mil/uL (ref 4.22–5.81)
RDW: 12.9 % (ref 11.5–15.5)
WBC: 8.2 10*3/uL (ref 4.0–10.5)

## 2022-11-17 LAB — LIPID PANEL
Cholesterol: 142 mg/dL (ref 0–200)
HDL: 48.2 mg/dL (ref >39.00)
LDL Cholesterol: 63 mg/dL (ref 0–99)
NonHDL: 93.92
Total CHOL/HDL Ratio: 3
Triglycerides: 155 mg/dL — ABNORMAL HIGH (ref 0.0–149.0)
VLDL: 31 mg/dL (ref 0.0–40.0)

## 2022-11-17 LAB — BASIC METABOLIC PANEL
BUN: 20 mg/dL (ref 6–23)
CO2: 26 mEq/L (ref 19–32)
Calcium: 9.8 mg/dL (ref 8.4–10.5)
Chloride: 104 mEq/L (ref 96–112)
Creatinine, Ser: 0.96 mg/dL (ref 0.40–1.50)
GFR: 87.31 mL/min (ref >60.00)
Glucose, Bld: 151 mg/dL — ABNORMAL HIGH (ref 70–99)
Potassium: 4.4 mEq/L (ref 3.5–5.1)
Sodium: 140 mEq/L (ref 135–145)

## 2022-11-17 LAB — VITAMIN B12: Vitamin B-12: 807 pg/mL (ref 211–911)

## 2022-11-17 LAB — HEMOGLOBIN A1C: Hgb A1c MFr Bld: 6.5 % (ref 4.6–6.5)

## 2022-11-17 LAB — FOLATE: Folate: >23.9 ng/mL (ref >5.9)

## 2022-12-03 ENCOUNTER — Encounter: Payer: Self-pay | Admitting: Internal Medicine

## 2022-12-10 DIAGNOSIS — L570 Actinic keratosis: Secondary | ICD-10-CM | POA: Diagnosis not present

## 2022-12-10 DIAGNOSIS — L281 Prurigo nodularis: Secondary | ICD-10-CM | POA: Diagnosis not present

## 2022-12-10 DIAGNOSIS — L814 Other melanin hyperpigmentation: Secondary | ICD-10-CM | POA: Diagnosis not present

## 2022-12-10 DIAGNOSIS — Z85828 Personal history of other malignant neoplasm of skin: Secondary | ICD-10-CM | POA: Diagnosis not present

## 2022-12-29 ENCOUNTER — Other Ambulatory Visit: Payer: Self-pay | Admitting: Internal Medicine

## 2022-12-29 DIAGNOSIS — J4489 Other specified chronic obstructive pulmonary disease: Secondary | ICD-10-CM

## 2023-01-05 ENCOUNTER — Other Ambulatory Visit: Payer: Self-pay | Admitting: Internal Medicine

## 2023-01-05 DIAGNOSIS — J4489 Other specified chronic obstructive pulmonary disease: Secondary | ICD-10-CM

## 2023-01-07 ENCOUNTER — Other Ambulatory Visit: Payer: Self-pay | Admitting: Internal Medicine

## 2023-01-07 DIAGNOSIS — J301 Allergic rhinitis due to pollen: Secondary | ICD-10-CM

## 2023-01-16 ENCOUNTER — Other Ambulatory Visit: Payer: Self-pay | Admitting: Internal Medicine

## 2023-01-16 DIAGNOSIS — M19071 Primary osteoarthritis, right ankle and foot: Secondary | ICD-10-CM

## 2023-01-16 DIAGNOSIS — M4802 Spinal stenosis, cervical region: Secondary | ICD-10-CM

## 2023-01-26 LAB — HM DIABETES EYE EXAM

## 2023-02-01 ENCOUNTER — Other Ambulatory Visit: Payer: Self-pay | Admitting: Internal Medicine

## 2023-02-01 DIAGNOSIS — E785 Hyperlipidemia, unspecified: Secondary | ICD-10-CM

## 2023-02-04 ENCOUNTER — Ambulatory Visit (INDEPENDENT_AMBULATORY_CARE_PROVIDER_SITE_OTHER): Payer: Medicare Other

## 2023-02-04 VITALS — Wt 210.0 lb

## 2023-02-04 DIAGNOSIS — Z0000 Encounter for general adult medical examination without abnormal findings: Secondary | ICD-10-CM

## 2023-02-04 NOTE — Progress Notes (Signed)
Subjective:   Jeff Norton is a 57 y.o. male who presents for Medicare Annual/Subsequent preventive examination.  Visit Complete: Virtual  I connected with  Jeff Norton on 02/04/23 by a audio enabled telemedicine application and verified that I am speaking with the correct person using two identifiers.  Patient Location: Home  Provider Location: Home Office  I discussed the limitations of evaluation and management by telemedicine. The patient expressed understanding and agreed to proceed.  Vital Signs: Unable to obtain new vitals due to this being a telehealth visit. Review of Systems     Cardiac Risk Factors include: advanced age (>46men, >71 women);male gender;dyslipidemia;obesity (BMI >30kg/m2);smoking/ tobacco exposure     Objective:    Today's Vitals   02/04/23 1611  Weight: 210 lb (95.3 kg)   Body mass index is 31.01 kg/m.     02/04/2023    4:42 PM 01/22/2022    8:49 AM 01/09/2017    4:15 PM 07/04/2016    8:50 PM 04/10/2014    1:22 PM  Advanced Directives  Does Patient Have a Medical Advance Directive? No No No No No  Would patient like information on creating a medical advance directive? No - Patient declined No - Patient declined Yes (ED - Information included in AVS) Yes (Inpatient - patient defers creating a medical advance directive at this time) No - patient declined information    Current Medications (verified) Outpatient Encounter Medications as of 02/04/2023  Medication Sig   Albuterol Sulfate (PROAIR RESPICLICK) 108 (90 Base) MCG/ACT AEPB INHALE 1 PUFF INTO THE LUNGS THREE TIMES DAILY AS NEEDED   cholecalciferol (VITAMIN D) 25 MCG (1000 UNIT) tablet SMARTSIG:1 Tablet(s) By Mouth   cyclobenzaprine (FLEXERIL) 10 MG tablet TAKE 1 TABLET(10 MG) BY MOUTH THREE TIMES DAILY AS NEEDED FOR MUSCLE SPASMS   diclofenac (VOLTAREN) 75 MG EC tablet TAKE 1 TABLET BY MOUTH TWICE  DAILY   fluticasone (FLONASE) 50 MCG/ACT nasal spray Place 2 sprays into both nostrils  daily.   lamoTRIgine (LAMICTAL) 200 MG tablet Take 1 tablet (200 mg total) by mouth 2 (two) times daily.   LORazepam (ATIVAN) 1 MG tablet Take 1 tablet (1 mg total) by mouth 2 (two) times daily as needed for anxiety.   lurasidone (LATUDA) 80 MG TABS tablet Take 120 mg by mouth daily with breakfast.   MAGNESIUM-ZINC PO Take by mouth.   montelukast (SINGULAIR) 10 MG tablet TAKE 1 TABLET BY MOUTH AT  BEDTIME   Multiple Vitamin (MULTIVITAMIN) tablet Take 1 tablet by mouth daily.   Multiple Vitamins-Minerals (VITAMIN D3 COMPLETE PO) Take by mouth.   rosuvastatin (CRESTOR) 10 MG tablet TAKE 1 TABLET BY MOUTH DAILY   sertraline (ZOLOFT) 50 MG tablet Take 50 mg by mouth daily.   TRELEGY ELLIPTA 100-62.5-25 MCG/ACT AEPB INHALE 1 PUFF INTO THE LUNGS DAILY   ZYRTEC ALLERGY 10 MG tablet SMARTSIG:1 By Mouth   modafinil (PROVIGIL) 200 MG tablet Take 1 tablet (200 mg total) by mouth daily. (Patient not taking: Reported on 02/04/2023)   [DISCONTINUED] traMADol (ULTRAM) 50 MG tablet Take 50 mg by mouth every 6 (six) hours as needed.   No facility-administered encounter medications on file as of 02/04/2023.    Allergies (verified) Nicotine polacrilex, Azithromycin, Cleocin [clindamycin hcl], and Oxycodone   History: Past Medical History:  Diagnosis Date   Allergy    Anxiety    Asthma    Bipolar 1 disorder (HCC)    Depression    GERD (gastroesophageal reflux disease)  Past Surgical History:  Procedure Laterality Date   CHOLECYSTECTOMY     CYST REMOVAL HAND  1997   FRACTURE SURGERY     GALLBLADDER SURGERY     SPINE SURGERY     Family History  Problem Relation Age of Onset   Hypertension Mother    Mental illness Mother    Diabetes Father    Hyperlipidemia Father    Depression Sister    Diabetes Sister    Hyperlipidemia Sister    Cancer Maternal Grandmother    Mental illness Maternal Grandmother    Social History   Socioeconomic History   Marital status: Divorced    Spouse name: Not  on file   Number of children: Not on file   Years of education: Not on file   Highest education level: Not on file  Occupational History   Not on file  Tobacco Use   Smoking status: Every Day    Current packs/day: 1.00    Average packs/day: 1 pack/day for 75.6 years (75.6 ttl pk-yrs)    Types: Cigarettes    Start date: 1979   Smokeless tobacco: Never  Vaping Use   Vaping status: Every Day  Substance and Sexual Activity   Alcohol use: Yes    Alcohol/week: 21.0 standard drinks of alcohol    Types: 21 Cans of beer per week   Drug use: No   Sexual activity: Not Currently  Other Topics Concern   Not on file  Social History Narrative   Not on file   Social Determinants of Health   Financial Resource Strain: Low Risk  (02/04/2023)   Overall Financial Resource Strain (CARDIA)    Difficulty of Paying Living Expenses: Not hard at all  Food Insecurity: No Food Insecurity (02/04/2023)   Hunger Vital Sign    Worried About Running Out of Food in the Last Year: Never true    Ran Out of Food in the Last Year: Never true  Transportation Needs: No Transportation Needs (02/04/2023)   PRAPARE - Administrator, Civil Service (Medical): No    Lack of Transportation (Non-Medical): No  Physical Activity: Sufficiently Active (02/04/2023)   Exercise Vital Sign    Days of Exercise per Week: 5 days    Minutes of Exercise per Session: 30 min  Stress: No Stress Concern Present (02/04/2023)   [employer] of Occupational Health - Occupational Stress Questionnaire    Feeling of Stress : Not at all  Social Connections: Socially Isolated (02/04/2023)   Social Connection and Isolation Panel [NHANES]    Frequency of Communication with Friends and Family: More than three times a week    Frequency of Social Gatherings with Friends and Family: More than three times a week    Attends Religious Services: Never    Database administrator or Organizations: No    Attends Hospital doctor: Never    Marital Status: Divorced    Tobacco Counseling Ready to quit: Not Answered Counseling given: Not Answered   Clinical Intake:  Pre-visit preparation completed: Yes  Pain : No/denies pain     BMI - recorded: 31.01 Nutritional Status: BMI > 30  Obese Nutritional Risks: None Diabetes: Yes CBG done?: No Did pt. bring in CBG monitor from home?: No  How often do you need to have someone help you when you read instructions, pamphlets, or other written materials from your doctor or pharmacy?: 1 - Never  Interpreter Needed?: No  Information entered by :: Inetta Fermo  Gift Rueckert, LPN   Activities of Daily Living    02/04/2023    4:37 PM  In your present state of health, do you have any difficulty performing the following activities:  Hearing? 0  Vision? 0  Difficulty concentrating or making decisions? 0  Walking or climbing stairs? 0  Dressing or bathing? 0  Doing errands, shopping? 0  Preparing Food and eating ? N  Using the Toilet? N  In the past six months, have you accidently leaked urine? N  Do you have problems with loss of bowel control? N  Managing your Medications? N  Managing your Finances? N  Housekeeping or managing your Housekeeping? N    Patient Care Team: Etta Grandchild, MD as PCP - General (Internal Medicine)  Indicate any recent Medical Services you may have received from other than [facility] providers in the past year (date may be approximate).     Assessment:   This is a routine wellness examination for Kunta.  Hearing/Vision screen Hearing Screening - Comments:: Pt denies any hearing issues  Vision Screening - Comments:: Pt follows up with Dr Sinda Du for annual eye exams   Dietary issues and exercise activities discussed:     Goals Addressed             This Visit's Progress    Patient Stated       Stop smoking       Depression Screen    02/04/2023    4:42 PM 01/22/2022    8:51 AM 03/04/2021    3:26 PM 10/19/2019     4:01 PM 12/02/2018    3:37 PM 01/09/2017    4:39 PM 07/25/2015    4:24 PM  PHQ 2/9 Scores  PHQ - 2 Score 1 1 1 1 1 2  0  PHQ- 9 Score    2 3 2      Fall Risk    02/04/2023    4:44 PM 01/22/2022    8:49 AM 03/04/2021    3:26 PM 10/19/2019    4:01 PM 12/02/2018    3:37 PM  Fall Risk   Falls in the past year? 0 0 0 0 0  Number falls in past yr: 0 0 0 0 0  Injury with Fall? 0 0 0 0 0  Risk for fall due to : Impaired vision No Fall Risks  No Fall Risks   Follow up Falls prevention discussed Falls evaluation completed  Falls evaluation completed Falls evaluation completed    MEDICARE RISK AT HOME: Medicare Risk at Home Any stairs in or around the home?: Yes If so, are there any without handrails?: No Home free of loose throw rugs in walkways, pet beds, electrical cords, etc?: Yes Adequate lighting in your home to reduce risk of falls?: Yes Life alert?: No Use of a cane, walker or w/c?: No Grab bars in the bathroom?: No Shower chair or bench in shower?: No Elevated toilet seat or a handicapped toilet?: No  TIMED UP AND GO:  Was the test performed?  No    Cognitive Function:        02/04/2023    4:45 PM 01/22/2022    8:57 AM  6CIT Screen  What Year? 0 points 0 points  What month? 0 points 0 points  What time? 0 points 0 points  Count back from 20 0 points 0 points  Months in reverse 0 points 0 points  Repeat phrase 0 points 0 points  Total Score 0 points 0 points  Immunizations Immunization History  Administered Date(s) Administered   Fluad Quad(high Dose 65+) 02/28/2019   Influenza,inj,Quad PF,6+ Mos 04/14/2016, 02/19/2017, 02/25/2018, 03/04/2021, 03/10/2022   Influenza-Unspecified 08/11/2015   PFIZER(Purple Top)SARS-COV-2 Vaccination 08/30/2019, 09/10/2019   PNEUMOCOCCAL CONJUGATE-20 09/24/2020   Pneumococcal Polysaccharide-23 11/07/2015   Tdap 07/25/2015   Zoster Recombinant(Shingrix) 06/26/2017, 09/04/2017    TDAP status: Up to date  Flu Vaccine status: Due,  Education has been provided regarding the importance of this vaccine. Advised may receive this vaccine at local pharmacy or Health Dept. Aware to provide a copy of the vaccination record if obtained from local pharmacy or Health Dept. Verbalized acceptance and understanding.    Covid-19 vaccine status: Information provided on how to obtain vaccines.   Qualifies for Shingles Vaccine? Yes   Zostavax completed Yes   Shingrix Completed?: Yes  Screening Tests Health Maintenance  Topic Date Due   INFLUENZA VACCINE  01/15/2023   Diabetic kidney evaluation - Urine ACR  03/11/2023   FOOT EXAM  03/12/2023   OPHTHALMOLOGY EXAM  05/16/2023   HEMOGLOBIN A1C  05/19/2023   Lung Cancer Screening  10/20/2023   Diabetic kidney evaluation - eGFR measurement  11/17/2023   Medicare Annual Wellness (AWV)  02/04/2024   DTaP/Tdap/Td (2 - Td or Tdap) 07/24/2025   Colonoscopy  09/25/2031   Hepatitis C Screening  Completed   HIV Screening  Completed   Zoster Vaccines- Shingrix  Completed   HPV VACCINES  Aged Out   COVID-19 Vaccine  Discontinued    Health Maintenance  Health Maintenance Due  Topic Date Due   INFLUENZA VACCINE  01/15/2023    Colorectal cancer screening: Type of screening: Colonoscopy. Completed 09/24/21. Repeat every 10 years  Lung Cancer Screening: (Low Dose CT Chest recommended if Age 33-80 years, 20 pack-year currently smoking OR have quit w/in 15years.) does qualify.   Lung Cancer Screening Referral: 10/24/22  Additional Screening:  Hepatitis C Screening:  Completed 01/11/20  Vision Screening: Recommended annual ophthalmology exams for early detection of glaucoma and other disorders of the eye. Is the patient up to date with their annual eye exam?  Yes  Who is the provider or what is the name of the office in which the patient attends annual eye exams? Dr Sinda Du If pt is not established with a provider, would they like to be referred to a provider to establish care? No .    Dental Screening: Recommended annual dental exams for proper oral hygiene    Community Resource Referral / Chronic Care Management: CRR required this visit?  No   CCM required this visit?  No     Plan:     I have personally reviewed and noted the following in the patient's chart:   Medical and social history Use of alcohol, tobacco or illicit drugs  Current medications and supplements including opioid prescriptions. Patient is not currently taking opioid prescriptions. Functional ability and status Nutritional status Physical activity Advanced directives List of other physicians Hospitalizations, surgeries, and ER visits in previous 12 months Vitals Screenings to include cognitive, depression, and falls Referrals and appointments  In addition, I have reviewed and discussed with patient certain preventive protocols, quality metrics, and best practice recommendations. A written personalized care plan for preventive services as well as general preventive health recommendations were provided to patient.     Marzella Schlein, LPN   [id number]   After Visit Summary: (MyChart) Due to this being a telephonic visit, the after visit summary with patients personalized plan was  offered to patient via MyChart   Nurse Notes: none

## 2023-02-04 NOTE — Patient Instructions (Signed)
Jeff Norton , Thank you for taking time to come for your Medicare Wellness Visit. I appreciate your ongoing commitment to your health goals. Please review the following plan we discussed and let me know if I can assist you in the future.   Referrals/Orders/Follow-Ups/Clinician Recommendations: stop smoking  This is a list of the screening recommended for you and due dates:  Health Maintenance  Topic Date Due   Flu Shot  01/15/2023   Yearly kidney health urinalysis for diabetes  03/11/2023   Complete foot exam   03/12/2023   Eye exam for diabetics  05/16/2023   Hemoglobin A1C  05/19/2023   Screening for Lung Cancer  10/20/2023   Yearly kidney function blood test for diabetes  11/17/2023   Medicare Annual Wellness Visit  02/04/2024   DTaP/Tdap/Td vaccine (2 - Td or Tdap) 07/24/2025   Colon Cancer Screening  09/25/2031   Hepatitis C Screening  Completed   HIV Screening  Completed   Zoster (Shingles) Vaccine  Completed   HPV Vaccine  Aged Out   COVID-19 Vaccine  Discontinued    Advanced directives: (Declined) Advance directive discussed with you today. Even though you declined this today, please call our office should you change your mind, and we can give you the proper paperwork for you to fill out.  Next Medicare Annual Wellness Visit scheduled for next year: Yes

## 2023-02-12 ENCOUNTER — Other Ambulatory Visit: Payer: Self-pay | Admitting: Internal Medicine

## 2023-02-12 DIAGNOSIS — F3176 Bipolar disorder, in full remission, most recent episode depressed: Secondary | ICD-10-CM

## 2023-02-22 ENCOUNTER — Encounter: Payer: Self-pay | Admitting: Internal Medicine

## 2023-02-24 ENCOUNTER — Ambulatory Visit (INDEPENDENT_AMBULATORY_CARE_PROVIDER_SITE_OTHER): Payer: Medicare Other | Admitting: Internal Medicine

## 2023-02-24 ENCOUNTER — Encounter: Payer: Self-pay | Admitting: Internal Medicine

## 2023-02-24 VITALS — BP 136/86 | HR 67 | Temp 98.6°F | Resp 16 | Ht 69.0 in | Wt 210.0 lb

## 2023-02-24 DIAGNOSIS — E785 Hyperlipidemia, unspecified: Secondary | ICD-10-CM

## 2023-02-24 DIAGNOSIS — E118 Type 2 diabetes mellitus with unspecified complications: Secondary | ICD-10-CM | POA: Diagnosis not present

## 2023-02-24 DIAGNOSIS — I700 Atherosclerosis of aorta: Secondary | ICD-10-CM

## 2023-02-24 DIAGNOSIS — I10 Essential (primary) hypertension: Secondary | ICD-10-CM

## 2023-02-24 DIAGNOSIS — Z23 Encounter for immunization: Secondary | ICD-10-CM

## 2023-02-24 DIAGNOSIS — R3912 Poor urinary stream: Secondary | ICD-10-CM

## 2023-02-24 DIAGNOSIS — J431 Panlobular emphysema: Secondary | ICD-10-CM

## 2023-02-24 DIAGNOSIS — N401 Enlarged prostate with lower urinary tract symptoms: Secondary | ICD-10-CM | POA: Diagnosis not present

## 2023-02-24 LAB — PSA: PSA: 1.26 ng/mL (ref 0.10–4.00)

## 2023-02-24 LAB — HEMOGLOBIN A1C: Hgb A1c MFr Bld: 6.1 % (ref 4.6–6.5)

## 2023-02-24 MED ORDER — OLMESARTAN MEDOXOMIL 20 MG PO TABS
20.0000 mg | ORAL_TABLET | Freq: Every day | ORAL | 1 refills | Status: DC
Start: 2023-02-24 — End: 2023-05-04

## 2023-02-24 NOTE — Progress Notes (Unsigned)
Subjective:  Patient ID: Jeff Norton, male    DOB: May 12, 1966  Age: 57 y.o. MRN: [id number]  CC: COPD, Hypertension, Hyperlipidemia, and Diabetes   HPI Jeff Norton presents for f/up ----   Discussed the use of AI scribe software for clinical note transcription with the patient, who gave verbal consent to proceed.  History of Present Illness   The patient, with a history of psychiatric conditions and chronic pain, has been self-monitoring their blood pressure due to recent concerns of hypertension. They report consistent high readings, with the highest recorded as 160/100 a few days prior to the visit. They deny any associated symptoms such as headache, blurred vision, chest pain, or shortness of breath. However, they do report experiencing cold, clammy sweats and fatigue, which they attribute to their current medication regimen.  The patient's current medications include Diclofenac Sodium for pain management, Cyclobenzaprine as needed, and Zoloft. They also occasionally take Ativan. They deny any recent changes in their medication regimen. They express concern about potential interactions between their medications leading to elevated blood pressure.  The patient denies any lower extremity edema or hematuria. They have not noticed any changes in their overall health apart from the elevated blood pressure readings. They have been monitoring their blood pressure using a friend's cuff and have also had high readings at their psychiatric medication prescriber's office.       Outpatient Medications Prior to Visit  Medication Sig Dispense Refill   Albuterol Sulfate (PROAIR RESPICLICK) 108 (90 Base) MCG/ACT AEPB INHALE 1 PUFF INTO THE LUNGS THREE TIMES DAILY AS NEEDED 1 each 5   cholecalciferol (VITAMIN D) 25 MCG (1000 UNIT) tablet SMARTSIG:1 Tablet(s) By Mouth     cyclobenzaprine (FLEXERIL) 10 MG tablet TAKE 1 TABLET(10 MG) BY MOUTH THREE TIMES DAILY AS NEEDED FOR MUSCLE SPASMS 270 tablet 0    diclofenac (VOLTAREN) 75 MG EC tablet TAKE 1 TABLET BY MOUTH TWICE  DAILY 200 tablet 0   lamoTRIgine (LAMICTAL) 200 MG tablet TAKE 1 TABLET BY MOUTH TWICE  DAILY 180 tablet 0   LORazepam (ATIVAN) 1 MG tablet Take 1 tablet (1 mg total) by mouth 2 (two) times daily as needed for anxiety. 60 tablet 3   lurasidone (LATUDA) 80 MG TABS tablet Take 120 mg by mouth daily with breakfast.     MAGNESIUM-ZINC PO Take by mouth.     montelukast (SINGULAIR) 10 MG tablet TAKE 1 TABLET BY MOUTH AT  BEDTIME 90 tablet 0   Multiple Vitamin (MULTIVITAMIN) tablet Take 1 tablet by mouth daily.     Multiple Vitamins-Minerals (VITAMIN D3 COMPLETE PO) Take by mouth.     rosuvastatin (CRESTOR) 10 MG tablet TAKE 1 TABLET BY MOUTH DAILY 100 tablet 0   sertraline (ZOLOFT) 50 MG tablet Take 50 mg by mouth daily.     TRELEGY ELLIPTA 100-62.5-25 MCG/ACT AEPB INHALE 1 PUFF INTO THE LUNGS DAILY 120 each 0   ZYRTEC ALLERGY 10 MG tablet SMARTSIG:1 By Mouth     fluticasone (FLONASE) 50 MCG/ACT nasal spray Place 2 sprays into both nostrils daily. (Patient not taking: Reported on 02/24/2023) 48 g 1   modafinil (PROVIGIL) 200 MG tablet Take 1 tablet (200 mg total) by mouth daily. (Patient not taking: Reported on 02/04/2023) 90 tablet 0   No facility-administered medications prior to visit.    ROS Review of Systems  Objective:  BP 136/86 (BP Location: Left Arm, Patient Position: Sitting, Cuff Size: Normal)   Pulse 67   Temp 98.6 F (  37 C) (Oral)   Resp 16   Ht 5\' 9"  (1.753 m)   Wt 210 lb (95.3 kg)   SpO2 97%   BMI 31.01 kg/m   BP Readings from Last 3 Encounters:  02/24/23 136/86  11/12/22 122/74  03/10/22 136/80    Wt Readings from Last 3 Encounters:  02/24/23 210 lb (95.3 kg)  02/04/23 210 lb (95.3 kg)  11/12/22 210 lb (95.3 kg)    Physical Exam Cardiovascular:     Comments: EKG- NSR, 60 bpm RBBB No LVH Unchanged Musculoskeletal:     Right lower leg: No edema.     Left lower leg: No edema.     Lab  Results  Component Value Date   WBC 8.2 11/17/2022   HGB 15.1 11/17/2022   HCT 44.7 11/17/2022   PLT 176.0 11/17/2022   GLUCOSE 151 (H) 11/17/2022   CHOL 142 11/17/2022   TRIG 155.0 (H) 11/17/2022   HDL 48.20 11/17/2022   LDLDIRECT 73.0 03/10/2022   LDLCALC 63 11/17/2022   ALT 32 03/10/2022   AST 26 03/10/2022   NA 140 11/17/2022   K 4.4 11/17/2022   CL 104 11/17/2022   CREATININE 0.96 11/17/2022   BUN 20 11/17/2022   CO2 26 11/17/2022   TSH 1.54 03/10/2022   PSA 0.73 03/10/2022   INR 1.0 01/11/2020   HGBA1C 6.5 11/17/2022   MICROALBUR 14.7 (H) 03/10/2022    CT CHEST LUNG CA SCREEN LOW DOSE W/O CM  Result Date: 10/23/2022 CLINICAL DATA:  57 year old male current smoker with 45 pack-year smoking history. EXAM: CT CHEST WITHOUT CONTRAST LOW-DOSE FOR LUNG CANCER SCREENING TECHNIQUE: Multidetector CT imaging of the chest was performed following the standard protocol without IV contrast. RADIATION DOSE REDUCTION: This exam was performed according to the departmental dose-optimization program which includes automated exposure control, adjustment of the mA and/or kV according to patient size and/or use of iterative reconstruction technique. COMPARISON:  10/18/2021 screening chest CT. FINDINGS: Cardiovascular: Normal heart size. No significant pericardial effusion/thickening. Three-vessel coronary atherosclerosis. Atherosclerotic nonaneurysmal thoracic aorta. Normal caliber pulmonary arteries. Mediastinum/Nodes: No significant thyroid nodules. Unremarkable esophagus. No pathologically enlarged axillary, mediastinal or hilar lymph nodes, noting limited sensitivity for the detection of hilar adenopathy on this noncontrast study. Lungs/Pleura: No pneumothorax. No pleural effusion. Mild centrilobular emphysema with diffuse bronchial wall thickening. No acute consolidative airspace disease or lung masses. No significant growth of previously visualized numerous scattered small bilateral solid pulmonary  nodules. No new significant pulmonary nodules. Upper abdomen: Mild diffuse hepatic steatosis.  Cholecystectomy. Musculoskeletal: No aggressive appearing focal osseous lesions. Moderate thoracic spondylosis. Partially visualized surgical hardware from ACDF. IMPRESSION: 1. Lung-RADS 2, benign appearance or behavior. Continue annual screening with low-dose chest CT without contrast in 12 months. 2. Three-vessel coronary atherosclerosis. 3. Mild diffuse hepatic steatosis. 4. Aortic Atherosclerosis (ICD10-I70.0) and Emphysema (ICD10-J43.9). Electronically Signed   By: Delbert Phenix M.D.   On: 10/23/2022 22:44   Assessment & Plan:  Type II diabetes mellitus with manifestations (HCC) -     Urinalysis, Routine w reflex microscopic; Future -     Basic metabolic panel; Future -     Hemoglobin A1c; Future -     Microalbumin / creatinine urine ratio; Future -     Olmesartan Medoxomil; Take 1 tablet (20 mg total) by mouth daily.  Dispense: 90 tablet; Refill: 1  Panlobular emphysema (HCC)  Atherosclerosis of aorta (HCC) -     Lipoprotein A (LPA); Future  Need for vaccination -  Flu vaccine trivalent PF, 6mos and older(Flulaval,Afluria,Fluarix,Fluzone)  Hyperlipidemia LDL goal <100 -     Lipoprotein A (LPA); Future  Primary hypertension -     EKG 12-Lead -     Olmesartan Medoxomil; Take 1 tablet (20 mg total) by mouth daily.  Dispense: 90 tablet; Refill: 1  Benign prostatic hyperplasia with weak urinary stream -     PSA; Future     Follow-up: Return in about 4 months (around 06/26/2023).  Sanda Linger, MD

## 2023-02-24 NOTE — Patient Instructions (Signed)
Hypertension, Adult High blood pressure (hypertension) is when the force of blood pumping through the arteries is too strong. The arteries are the blood vessels that carry blood from the heart throughout the body. Hypertension forces the heart to work harder to pump blood and may cause arteries to become narrow or stiff. Untreated or uncontrolled hypertension can lead to a heart attack, heart failure, a stroke, kidney disease, and other problems. A blood pressure reading consists of a higher number over a lower number. Ideally, your blood pressure should be below 120/80. The first ("top") number is called the systolic pressure. It is a measure of the pressure in your arteries as your heart beats. The second ("bottom") number is called the diastolic pressure. It is a measure of the pressure in your arteries as the heart relaxes. What are the causes? The exact cause of this condition is not known. There are some conditions that result in high blood pressure. What increases the risk? Certain factors may make you more likely to develop high blood pressure. Some of these risk factors are under your control, including: Smoking. Not getting enough exercise or physical activity. Being overweight. Having too much fat, sugar, calories, or salt (sodium) in your diet. Drinking too much alcohol. Other risk factors include: Having a personal history of heart disease, diabetes, high cholesterol, or kidney disease. Stress. Having a family history of high blood pressure and high cholesterol. Having obstructive sleep apnea. Age. The risk increases with age. What are the signs or symptoms? High blood pressure may not cause symptoms. Very high blood pressure (hypertensive crisis) may cause: Headache. Fast or irregular heartbeats (palpitations). Shortness of breath. Nosebleed. Nausea and vomiting. Vision changes. Severe chest pain, dizziness, and seizures. How is this diagnosed? This condition is diagnosed by  measuring your blood pressure while you are seated, with your arm resting on a flat surface, your legs uncrossed, and your feet flat on the floor. The cuff of the blood pressure monitor will be placed directly against the skin of your upper arm at the level of your heart. Blood pressure should be measured at least twice using the same arm. Certain conditions can cause a difference in blood pressure between your right and left arms. If you have a high blood pressure reading during one visit or you have normal blood pressure with other risk factors, you may be asked to: Return on a different day to have your blood pressure checked again. Monitor your blood pressure at home for 1 week or longer. If you are diagnosed with hypertension, you may have other blood or imaging tests to help your health care provider understand your overall risk for other conditions. How is this treated? This condition is treated by making healthy lifestyle changes, such as eating healthy foods, exercising more, and reducing your alcohol intake. You may be referred for counseling on a healthy diet and physical activity. Your health care provider may prescribe medicine if lifestyle changes are not enough to get your blood pressure under control and if: Your systolic blood pressure is above 130. Your diastolic blood pressure is above 80. Your personal target blood pressure may vary depending on your medical conditions, your age, and other factors. Follow these instructions at home: Eating and drinking  Eat a diet that is high in fiber and potassium, and low in sodium, added sugar, and fat. An example of this eating plan is called the DASH diet. DASH stands for Dietary Approaches to Stop Hypertension. To eat this way: Eat   plenty of fresh fruits and vegetables. Try to fill one half of your plate at each meal with fruits and vegetables. Eat whole grains, such as whole-wheat pasta, brown rice, or whole-grain bread. Fill about one  fourth of your plate with whole grains. Eat or drink low-fat dairy products, such as skim milk or low-fat yogurt. Avoid fatty cuts of meat, processed or cured meats, and poultry with skin. Fill about one fourth of your plate with lean proteins, such as fish, chicken without skin, beans, eggs, or tofu. Avoid pre-made and processed foods. These tend to be higher in sodium, added sugar, and fat. Reduce your daily sodium intake. Many people with hypertension should eat less than 1,500 mg of sodium a day. Do not drink alcohol if: Your health care provider tells you not to drink. You are pregnant, may be pregnant, or are planning to become pregnant. If you drink alcohol: Limit how much you have to: 0-1 drink a day for women. 0-2 drinks a day for men. Know how much alcohol is in your drink. In the [place] one drink equals one 12 oz bottle of beer (355 mL), one 5 oz glass of wine (148 mL), or one 1 oz glass of hard liquor (44 mL). Lifestyle  Work with your health care provider to maintain a healthy body weight or to lose weight. Ask what an ideal weight is for you. Get at least 30 minutes of exercise that causes your heart to beat faster (aerobic exercise) most days of the week. Activities may include walking, swimming, or biking. Include exercise to strengthen your muscles (resistance exercise), such as Pilates or lifting weights, as part of your weekly exercise routine. Try to do these types of exercises for 30 minutes at least 3 days a week. Do not use any products that contain nicotine or tobacco. These products include cigarettes, chewing tobacco, and vaping devices, such as e-cigarettes. If you need help quitting, ask your health care provider. Monitor your blood pressure at home as told by your health care provider. Keep all follow-up visits. This is important. Medicines Take over-the-counter and prescription medicines only as told by your health care provider. Follow directions carefully. Blood  pressure medicines must be taken as prescribed. Do not skip doses of blood pressure medicine. Doing this puts you at risk for problems and can make the medicine less effective. Ask your health care provider about side effects or reactions to medicines that you should watch for. Contact a health care provider if you: Think you are having a reaction to a medicine you are taking. Have headaches that keep coming back (recurring). Feel dizzy. Have swelling in your ankles. Have trouble with your vision. Get help right away if you: Develop a severe headache or confusion. Have unusual weakness or numbness. Feel faint. Have severe pain in your chest or abdomen. Vomit repeatedly. Have trouble breathing. These symptoms may be an emergency. Get help right away. Call 911. Do not wait to see if the symptoms will go away. Do not drive yourself to the hospital. Summary Hypertension is when the force of blood pumping through your arteries is too strong. If this condition is not controlled, it may put you at risk for serious complications. Your personal target blood pressure may vary depending on your medical conditions, your age, and other factors. For most people, a normal blood pressure is less than 120/80. Hypertension is treated with lifestyle changes, medicines, or a combination of both. Lifestyle changes include losing weight, eating a healthy,   low-sodium diet, exercising more, and limiting alcohol. This information is not intended to replace advice given to you by your health care provider. Make sure you discuss any questions you have with your health care provider. Document Revised: 04/09/2021 Document Reviewed: 04/09/2021 Elsevier Patient Education  2024 [employer]  

## 2023-02-25 LAB — URINALYSIS, ROUTINE W REFLEX MICROSCOPIC
Bilirubin Urine: NEGATIVE
Hgb urine dipstick: NEGATIVE
Ketones, ur: NEGATIVE
Leukocytes,Ua: NEGATIVE
Nitrite: NEGATIVE
Specific Gravity, Urine: 1.015 (ref 1.000–1.030)
Total Protein, Urine: NEGATIVE
Urine Glucose: NEGATIVE
Urobilinogen, UA: 0.2 (ref 0.0–1.0)
pH: 7 (ref 5.0–8.0)

## 2023-02-25 LAB — MICROALBUMIN / CREATININE URINE RATIO
Creatinine,U: 65.8 mg/dL
Microalb Creat Ratio: 17.9 mg/g (ref 0.0–30.0)
Microalb, Ur: 11.8 mg/dL — ABNORMAL HIGH (ref 0.0–1.9)

## 2023-02-25 LAB — BASIC METABOLIC PANEL
BUN: 17 mg/dL (ref 6–23)
CO2: 29 meq/L (ref 19–32)
Calcium: 9.8 mg/dL (ref 8.4–10.5)
Chloride: 101 meq/L (ref 96–112)
Creatinine, Ser: 0.87 mg/dL (ref 0.40–1.50)
GFR: 95.13 mL/min (ref >60.00)
Glucose, Bld: 100 mg/dL — ABNORMAL HIGH (ref 70–99)
Potassium: 4 meq/L (ref 3.5–5.1)
Sodium: 139 meq/L (ref 135–145)

## 2023-02-27 LAB — LIPOPROTEIN A (LPA): Lipoprotein (a): <10 nmol/L (ref <75)

## 2023-03-09 ENCOUNTER — Other Ambulatory Visit: Payer: Self-pay | Admitting: Internal Medicine

## 2023-03-09 DIAGNOSIS — J4489 Other specified chronic obstructive pulmonary disease: Secondary | ICD-10-CM

## 2023-03-10 ENCOUNTER — Other Ambulatory Visit: Payer: Self-pay | Admitting: Internal Medicine

## 2023-03-10 DIAGNOSIS — J301 Allergic rhinitis due to pollen: Secondary | ICD-10-CM

## 2023-03-12 ENCOUNTER — Ambulatory Visit
Admission: RE | Admit: 2023-03-12 | Discharge: 2023-03-12 | Disposition: A | Discharge status: Home / Self Care | Payer: No Typology Code available for payment source | Source: Ambulatory Visit | Attending: Internal Medicine | Admitting: Internal Medicine

## 2023-03-12 DIAGNOSIS — E785 Hyperlipidemia, unspecified: Secondary | ICD-10-CM

## 2023-03-12 DIAGNOSIS — I700 Atherosclerosis of aorta: Secondary | ICD-10-CM

## 2023-03-23 ENCOUNTER — Encounter: Payer: Self-pay | Admitting: Internal Medicine

## 2023-03-24 ENCOUNTER — Other Ambulatory Visit: Payer: Self-pay | Admitting: Internal Medicine

## 2023-03-24 DIAGNOSIS — R931 Abnormal findings on diagnostic imaging of heart and coronary circulation: Secondary | ICD-10-CM | POA: Insufficient documentation

## 2023-03-30 ENCOUNTER — Ambulatory Visit: Payer: Self-pay | Admitting: Internal Medicine

## 2023-04-08 ENCOUNTER — Other Ambulatory Visit: Payer: Self-pay | Admitting: Internal Medicine

## 2023-04-08 DIAGNOSIS — E785 Hyperlipidemia, unspecified: Secondary | ICD-10-CM

## 2023-04-15 ENCOUNTER — Other Ambulatory Visit (HOSPITAL_COMMUNITY): Payer: Self-pay | Admitting: *Deleted

## 2023-04-15 ENCOUNTER — Encounter (HOSPITAL_COMMUNITY): Payer: Self-pay

## 2023-04-15 MED ORDER — METOPROLOL TARTRATE 50 MG PO TABS: ORAL_TABLET | ORAL | 0 refills | Status: DC

## 2023-04-16 ENCOUNTER — Telehealth (HOSPITAL_COMMUNITY): Payer: Self-pay | Admitting: *Deleted

## 2023-04-16 NOTE — Telephone Encounter (Signed)
Reaching out to patient to offer assistance regarding upcoming cardiac imaging study; pt verbalizes understanding of appt date/time, parking situation and where to check in, pre-test NPO status and medications ordered, and verified current allergies; name and call back number provided for further questions should they arise Hayley Sharpe RN Navigator Cardiac [facility] [phone] office [phone] cell  

## 2023-04-17 ENCOUNTER — Ambulatory Visit (HOSPITAL_COMMUNITY)
Admission: RE | Admit: 2023-04-17 | Discharge: 2023-04-17 | Disposition: A | Discharge status: Home / Self Care | Payer: Medicare Other | Source: Ambulatory Visit | Attending: Internal Medicine | Admitting: Internal Medicine

## 2023-04-17 DIAGNOSIS — R079 Chest pain, unspecified: Secondary | ICD-10-CM | POA: Insufficient documentation

## 2023-04-17 DIAGNOSIS — I2510 Atherosclerotic heart disease of native coronary artery without angina pectoris: Secondary | ICD-10-CM | POA: Insufficient documentation

## 2023-04-17 DIAGNOSIS — R931 Abnormal findings on diagnostic imaging of heart and coronary circulation: Secondary | ICD-10-CM | POA: Diagnosis not present

## 2023-04-17 LAB — POCT I-STAT CREATININE: Creatinine, Ser: 1 mg/dL (ref 0.61–1.24)

## 2023-04-17 MED ORDER — NITROGLYCERIN 0.4 MG SL SUBL
0.8000 mg | SUBLINGUAL_TABLET | Freq: Once | SUBLINGUAL | Status: AC
  Administered 2023-04-17: 0.8 mg via SUBLINGUAL

## 2023-04-17 MED ORDER — NITROGLYCERIN 0.4 MG SL SUBL
SUBLINGUAL_TABLET | SUBLINGUAL | Status: AC
  Filled 2023-04-17: qty 2

## 2023-04-17 MED ORDER — IOHEXOL 350 MG/ML SOLN
100.0000 mL | Freq: Once | INTRAVENOUS | Status: AC | PRN
  Administered 2023-04-17: 100 mL via INTRAVENOUS

## 2023-04-18 ENCOUNTER — Other Ambulatory Visit: Payer: Self-pay | Admitting: Internal Medicine

## 2023-04-18 DIAGNOSIS — F3176 Bipolar disorder, in full remission, most recent episode depressed: Secondary | ICD-10-CM

## 2023-04-21 ENCOUNTER — Other Ambulatory Visit: Payer: Self-pay | Admitting: Internal Medicine

## 2023-04-21 DIAGNOSIS — M19071 Primary osteoarthritis, right ankle and foot: Secondary | ICD-10-CM

## 2023-04-21 DIAGNOSIS — M4802 Spinal stenosis, cervical region: Secondary | ICD-10-CM

## 2023-04-23 DIAGNOSIS — M5412 Radiculopathy, cervical region: Secondary | ICD-10-CM | POA: Diagnosis not present

## 2023-04-28 ENCOUNTER — Encounter: Payer: Self-pay | Admitting: Internal Medicine

## 2023-05-04 ENCOUNTER — Other Ambulatory Visit: Payer: Self-pay | Admitting: Internal Medicine

## 2023-05-04 DIAGNOSIS — I10 Essential (primary) hypertension: Secondary | ICD-10-CM

## 2023-05-04 DIAGNOSIS — E118 Type 2 diabetes mellitus with unspecified complications: Secondary | ICD-10-CM

## 2023-05-06 DIAGNOSIS — M5412 Radiculopathy, cervical region: Secondary | ICD-10-CM | POA: Diagnosis not present

## 2023-05-12 ENCOUNTER — Other Ambulatory Visit: Payer: Self-pay | Admitting: Internal Medicine

## 2023-05-12 DIAGNOSIS — J301 Allergic rhinitis due to pollen: Secondary | ICD-10-CM

## 2023-05-27 DIAGNOSIS — M5412 Radiculopathy, cervical region: Secondary | ICD-10-CM | POA: Diagnosis not present

## 2023-05-28 ENCOUNTER — Other Ambulatory Visit: Payer: Self-pay | Admitting: Internal Medicine

## 2023-05-28 DIAGNOSIS — F411 Generalized anxiety disorder: Secondary | ICD-10-CM

## 2023-06-06 ENCOUNTER — Other Ambulatory Visit: Payer: Self-pay | Admitting: Internal Medicine

## 2023-06-06 DIAGNOSIS — J4489 Other specified chronic obstructive pulmonary disease: Secondary | ICD-10-CM

## 2023-06-17 DIAGNOSIS — J441 Chronic obstructive pulmonary disease with (acute) exacerbation: Secondary | ICD-10-CM | POA: Diagnosis not present

## 2023-06-17 DIAGNOSIS — R059 Cough, unspecified: Secondary | ICD-10-CM | POA: Diagnosis not present

## 2023-06-17 DIAGNOSIS — Z20822 Contact with and (suspected) exposure to covid-19: Secondary | ICD-10-CM | POA: Diagnosis not present

## 2023-06-17 DIAGNOSIS — R051 Acute cough: Secondary | ICD-10-CM | POA: Diagnosis not present

## 2023-06-17 DIAGNOSIS — R509 Fever, unspecified: Secondary | ICD-10-CM | POA: Diagnosis not present

## 2023-06-21 ENCOUNTER — Other Ambulatory Visit: Payer: Self-pay | Admitting: Internal Medicine

## 2023-06-21 DIAGNOSIS — E785 Hyperlipidemia, unspecified: Secondary | ICD-10-CM

## 2023-06-29 ENCOUNTER — Encounter: Payer: Self-pay | Admitting: Internal Medicine

## 2023-06-29 ENCOUNTER — Ambulatory Visit (INDEPENDENT_AMBULATORY_CARE_PROVIDER_SITE_OTHER): Payer: Medicare Other | Admitting: Internal Medicine

## 2023-06-29 VITALS — BP 134/78 | HR 79 | Temp 98.5°F | Resp 16 | Ht 69.0 in | Wt 208.4 lb

## 2023-06-29 DIAGNOSIS — R0681 Apnea, not elsewhere classified: Secondary | ICD-10-CM

## 2023-06-29 DIAGNOSIS — I10 Essential (primary) hypertension: Secondary | ICD-10-CM

## 2023-06-29 DIAGNOSIS — K219 Gastro-esophageal reflux disease without esophagitis: Secondary | ICD-10-CM

## 2023-06-29 DIAGNOSIS — E785 Hyperlipidemia, unspecified: Secondary | ICD-10-CM

## 2023-06-29 DIAGNOSIS — E118 Type 2 diabetes mellitus with unspecified complications: Secondary | ICD-10-CM | POA: Diagnosis not present

## 2023-06-29 LAB — CBC WITH DIFFERENTIAL/PLATELET
Basophils Absolute: 0.1 10*3/uL (ref 0.0–0.1)
Basophils Relative: 0.8 % (ref 0.0–3.0)
Eosinophils Absolute: 0.2 10*3/uL (ref 0.0–0.7)
Eosinophils Relative: 1.8 % (ref 0.0–5.0)
HCT: 45.8 % (ref 39.0–52.0)
Hemoglobin: 15.4 g/dL (ref 13.0–17.0)
Lymphocytes Relative: 27.2 % (ref 12.0–46.0)
Lymphs Abs: 3.5 10*3/uL (ref 0.7–4.0)
MCHC: 33.6 g/dL (ref 30.0–36.0)
MCV: 88.2 fL (ref 78.0–100.0)
Monocytes Absolute: 0.9 10*3/uL (ref 0.1–1.0)
Monocytes Relative: 7.3 % (ref 3.0–12.0)
Neutro Abs: 8.1 10*3/uL — ABNORMAL HIGH (ref 1.4–7.7)
Neutrophils Relative %: 62.9 % (ref 43.0–77.0)
Platelets: 183 10*3/uL (ref 150.0–400.0)
RBC: 5.2 Mil/uL (ref 4.22–5.81)
RDW: 13.4 % (ref 11.5–15.5)
WBC: 12.8 10*3/uL — ABNORMAL HIGH (ref 4.0–10.5)

## 2023-06-29 LAB — BASIC METABOLIC PANEL
BUN: 24 mg/dL — ABNORMAL HIGH (ref 6–23)
CO2: 28 meq/L (ref 19–32)
Calcium: 10 mg/dL (ref 8.4–10.5)
Chloride: 99 meq/L (ref 96–112)
Creatinine, Ser: 0.92 mg/dL (ref 0.40–1.50)
GFR: 91.49 mL/min (ref >60.00)
Glucose, Bld: 126 mg/dL — ABNORMAL HIGH (ref 70–99)
Potassium: 4.2 meq/L (ref 3.5–5.1)
Sodium: 136 meq/L (ref 135–145)

## 2023-06-29 LAB — HEPATIC FUNCTION PANEL
ALT: 32 U/L (ref 0–53)
AST: 26 U/L (ref 0–37)
Albumin: 4.9 g/dL (ref 3.5–5.2)
Alkaline Phosphatase: 56 U/L (ref 39–117)
Bilirubin, Direct: 0.1 mg/dL (ref 0.0–0.3)
Total Bilirubin: 1 mg/dL (ref 0.2–1.2)
Total Protein: 7.9 g/dL (ref 6.0–8.3)

## 2023-06-29 LAB — HEMOGLOBIN A1C: Hgb A1c MFr Bld: 6.4 % (ref 4.6–6.5)

## 2023-06-29 NOTE — Progress Notes (Signed)
 Subjective:  Patient ID: Jeff Norton, male    DOB: 07-Nov-1965  Age: 58 y.o. MRN: [id number]  CC: COPD, Diabetes, Hypertension, Hyperlipidemia, and Gastroesophageal Reflux   HPI DAYSHON ROBACK presents for f/up ---  Discussed the use of AI scribe software for clinical note transcription with the patient, who gave verbal consent to proceed.  History of Present Illness   The patient, with a history of emphysema, presents with concerns about his blood pressure, though he denies any symptoms of hypotension such as dizziness or lightheadedness. He also reports a recent unexplained weight loss of ten pounds, but is not overly concerned as his weight tends to fluctuate. He denies any changes in diet, difficulty swallowing, abdominal pain, or changes in bowel habits beyond what is expected due to a previous gallbladder removal.  The patient continues to struggle with smoking cessation, expressing a strong desire to quit. He has been experiencing some respiratory symptoms, including coughing and production of mucus, but denies any wheezing. He recently had an illness about 2 weeks ago, presenting with nausea, vomiting, and fever, for which he was treated with antibiotics and steroids.  He also reports some emotional distress related to the recent loss of his father but is managing his grief and maintaining regular contact with his mother. He denies any muscle aches or pains but does note occasional morning calf cramps that resolve with movement. He denies any cramping with walking and has good peripheral pulses.  The patient's medication regimen includes rosuvastatin  and aspirin , and he denies any adverse effects from these medications. He has not had a thyroid  level check in a year and has agreed to a blood test for this.       Outpatient Medications Prior to Visit  Medication Sig Dispense Refill   Albuterol  Sulfate (PROAIR  RESPICLICK) 108 (90 Base) MCG/ACT AEPB INHALE 1 PUFF INTO THE LUNGS  THREE TIMES DAILY AS NEEDED 1 each 5   Ascorbic Acid (VITAMIN C) 1000 MG tablet Take 1,000 mg by mouth daily.     cholecalciferol (VITAMIN D) 25 MCG (1000 UNIT) tablet SMARTSIG:1 Tablet(s) By Mouth     cyclobenzaprine  (FLEXERIL ) 10 MG tablet TAKE 1 TABLET(10 MG) BY MOUTH THREE TIMES DAILY AS NEEDED FOR MUSCLE SPASMS 270 tablet 0   diclofenac  (VOLTAREN ) 75 MG EC tablet TAKE 1 TABLET BY MOUTH TWICE  DAILY 200 tablet 0   lamoTRIgine  (LAMICTAL ) 200 MG tablet TAKE 1 TABLET BY MOUTH TWICE  DAILY 180 tablet 0   LORazepam  (ATIVAN ) 1 MG tablet TAKE 1 TABLET(1 MG) BY MOUTH TWICE DAILY AS NEEDED FOR ANXIETY 60 tablet 1   Lurasidone HCl 120 MG TABS Take 120 mg by mouth daily with breakfast.     MAGNESIUM-ZINC PO Take by mouth.     montelukast  (SINGULAIR ) 10 MG tablet TAKE 1 TABLET BY MOUTH AT  BEDTIME 90 tablet 0   Multiple Vitamin (MULTIVITAMIN) tablet Take 1 tablet by mouth daily.     Multiple Vitamins-Minerals (VITAMIN D3 COMPLETE PO) Take by mouth.     olmesartan  (BENICAR ) 20 MG tablet TAKE 1 TABLET BY MOUTH DAILY 100 tablet 0   rosuvastatin  (CRESTOR ) 10 MG tablet TAKE 1 TABLET BY MOUTH DAILY 90 tablet 1   sertraline (ZOLOFT) 50 MG tablet Take 50 mg by mouth daily.     TRELEGY ELLIPTA  100-62.5-25 MCG/ACT AEPB INHALE 1 PUFF INTO THE LUNGS DAILY 120 each 0   ZYRTEC ALLERGY 10 MG tablet SMARTSIG:1 By Mouth     fluticasone  (FLONASE ) 50  MCG/ACT nasal spray Place 2 sprays into both nostrils daily. 48 g 1   metoprolol  tartrate (LOPRESSOR ) 50 MG tablet Take tablet (50mg ) by mouth TWO hours prior to your cardiac CT scan. 1 tablet 0   No facility-administered medications prior to visit.    ROS Review of Systems  Constitutional: Negative.  Negative for chills, diaphoresis, fatigue and fever.  HENT: Negative.  Negative for sore throat and voice change.   Eyes: Negative.   Respiratory:  Positive for apnea, cough, shortness of breath and wheezing. Negative for chest tightness and stridor.   Cardiovascular:   Negative for chest pain, palpitations and leg swelling.  Gastrointestinal:  Negative for abdominal pain, constipation, diarrhea, nausea and vomiting.  Endocrine: Negative.   Genitourinary: Negative.  Negative for difficulty urinating.  Musculoskeletal:  Negative for arthralgias and myalgias.  Skin: Negative.   Neurological: Negative.  Negative for dizziness, weakness and light-headedness.  Hematological:  Negative for adenopathy. Does not bruise/bleed easily.  Psychiatric/Behavioral: Negative.      Objective:  BP 134/78 (BP Location: Left Arm, Patient Position: Sitting, Cuff Size: Normal)   Pulse 79   Temp 98.5 F (36.9 C) (Oral)   Resp 16   Ht 5' 9 (1.753 m)   Wt 208 lb 6.4 oz (94.5 kg)   SpO2 94%   BMI 30.78 kg/m   BP Readings from Last 3 Encounters:  06/29/23 134/78  04/17/23 112/69  02/24/23 136/86    Wt Readings from Last 3 Encounters:  06/29/23 208 lb 6.4 oz (94.5 kg)  02/24/23 210 lb (95.3 kg)  02/04/23 210 lb (95.3 kg)    Physical Exam Vitals reviewed.  Constitutional:      General: He is not in acute distress.    Appearance: He is not toxic-appearing or diaphoretic.  HENT:     Mouth/Throat:     Mouth: Mucous membranes are moist.  Eyes:     General: No scleral icterus.    Conjunctiva/sclera: Conjunctivae normal.  Cardiovascular:     Rate and Rhythm: Normal rate and regular rhythm.     Heart sounds: No murmur heard.    No friction rub. No gallop.  Pulmonary:     Breath sounds: No stridor. Examination of the right-middle field reveals rhonchi. Examination of the left-middle field reveals rhonchi. Examination of the right-lower field reveals rhonchi. Examination of the left-lower field reveals rhonchi. Rhonchi present. No decreased breath sounds, wheezing or rales.  Abdominal:     General: Bowel sounds are normal.     Palpations: There is no mass.     Tenderness: There is no abdominal tenderness. There is no guarding.     Hernia: No hernia is present.   Musculoskeletal:        General: Normal range of motion.     Cervical back: Neck supple.     Right lower leg: No edema.     Left lower leg: No edema.  Lymphadenopathy:     Cervical: No cervical adenopathy.  Skin:    General: Skin is warm.  Neurological:     General: No focal deficit present.     Mental Status: He is alert. Mental status is at baseline.  Psychiatric:        Mood and Affect: Mood normal.        Behavior: Behavior normal.     Lab Results  Component Value Date   WBC 12.8 (H) 06/29/2023   HGB 15.4 06/29/2023   HCT 45.8 06/29/2023   PLT 183.0 06/29/2023  GLUCOSE 126 (H) 06/29/2023   CHOL 142 11/17/2022   TRIG 155.0 (H) 11/17/2022   HDL 48.20 11/17/2022   LDLDIRECT 73.0 03/10/2022   LDLCALC 63 11/17/2022   ALT 32 06/29/2023   AST 26 06/29/2023   NA 136 06/29/2023   K 4.2 06/29/2023   CL 99 06/29/2023   CREATININE 0.92 06/29/2023   BUN 24 (H) 06/29/2023   CO2 28 06/29/2023   TSH 2.25 06/29/2023   PSA 1.26 02/24/2023   INR 1.0 01/11/2020   HGBA1C 6.4 06/29/2023   MICROALBUR 11.8 (H) 02/24/2023    CT CORONARY MORPH W/CTA COR W/SCORE W/CA W/CM &/OR WO/CM Addendum Date: 05/10/2023 ADDENDUM REPORT: 05/10/2023 02:50 EXAM: OVER-READ INTERPRETATION  CT CHEST The following report is an over-read performed by radiologist Dr. Leita Sandy [facility] on 05/10/2023. This over-read does not include interpretation of cardiac or coronary anatomy or pathology. The coronary CTA interpretation by the cardiologist is attached. COMPARISON:  03/12/2023. FINDINGS: Heart is normal in size and coronary artery calcifications are noted. The aorta and pulmonary trunk are normal in caliber. No mediastinal lymphadenopathy. There is a nonspecific prominent lymph node at the right hilum measuring 1.2 cm. The visualized esophagus is within normal limits. Mild atelectasis is present at the lung bases. No effusion or pneumothorax. No acute abnormality in the upper abdomen.  Degenerative changes are noted in the thoracic spine. No acute osseous abnormality. IMPRESSION: 1. Coronary artery calcifications. Electronically Signed   By: Leita Birmingham M.D.   On: 05/10/2023 02:50   Result Date: 05/10/2023 CLINICAL DATA:  Chest pain EXAM: Cardiac/Coronary CTA TECHNIQUE: A non-contrast, gated CT scan was obtained with axial slices of 3 mm through the heart for calcium  scoring. Calcium  scoring was performed using the Agatston method. A 120 kV prospective, gated, contrast cardiac scan was obtained. Gantry rotation speed was 250 msecs and collimation was 0.6 mm. Two sublingual nitroglycerin  tablets (0.8 mg) were given. The 3D data set was reconstructed in 5% intervals of the 35-75% of the R-R cycle. Diastolic phases were analyzed on a dedicated workstation using MPR, MIP, and VRT modes. The patient received 95 cc of contrast. FINDINGS: Image quality: Excellent. Noise artifact is: Limited. Coronary Arteries:  Normal coronary origin.  Right dominance. Left main: The left main is a large caliber vessel with a normal take off from the left coronary cusp that bifurcates to form a left anterior descending artery and a left circumflex artery. There is no plaque or stenosis. Left anterior descending artery: The LAD gives off 3 patent diagonal branches. There is minimal calcified plaque in the proximal LAD with associated stenosis of <25%. There is mild calcified plaque in the mid LAD with associated stenosis of 25-49%. Left circumflex artery: The LCX is non-dominant and gives off 2 patent obtuse marginal branches. There is minimal calcified plaque in the proximal and mid LCx with associated stenosis of < 25%. Right coronary artery: The RCA is dominant with normal take off from the right coronary cusp. The RCA terminates as a PDA and right posterolateral branch. There is minimal calcified plaque in the proximal and distal RCA with associated stenosis of <25%. Right Atrium: Right atrial size is within  normal limits. Right Ventricle: The right ventricular cavity is within normal limits. Left Atrium: Left atrial size is normal in size with no left atrial appendage filling defect. Left Ventricle: The ventricular cavity size is within normal limits. Pulmonary arteries: Normal in size. Pulmonary veins: Normal pulmonary venous drainage. Pericardium: Normal thickness without  significant effusion or calcium  present. Cardiac valves: The aortic valve is trileaflet without significant calcification. The mitral valve is normal without significant calcification. Aorta: Normal caliber without significant disease. Extra-cardiac findings: See attached radiology report for non-cardiac structures. IMPRESSION: 1. Coronary calcium  score of 179. This was 81st percentile for age-, sex, and race-matched controls. 2. Total plaque volume 275 mm3 which is 53rd percentile for age- and sex-matched controls (calcified plaque 63mm3; non-calcified plaque 231mm3). TPV is severe. 3. Normal coronary origin with right dominance. 4. Mild atherosclerosis: 25-49% mid LAD. 5. Recommend preventive therapy and risk factor modification. 6. Consider non atherosclerotic causes of chest pain. RECOMMENDATIONS: 1. CAD-RADS 0: No evidence of CAD (0%). Consider non-atherosclerotic causes of chest pain. 2. CAD-RADS 1: Minimal non-obstructive CAD (0-24%). Consider non-atherosclerotic causes of chest pain. Consider preventive therapy and risk factor modification. 3. CAD-RADS 2: Mild non-obstructive CAD (25-49%). Consider non-atherosclerotic causes of chest pain. Consider preventive therapy and risk factor modification. 4. CAD-RADS 3: Moderate stenosis. Consider symptom-guided anti-ischemic pharmacotherapy as well as risk factor modification per guideline directed care. Additional analysis with CT FFR will be submitted. 5. CAD-RADS 4: Severe stenosis. (70-99% or > 50% left main). Cardiac catheterization or CT FFR is recommended. Consider symptom-guided  anti-ischemic pharmacotherapy as well as risk factor modification per guideline directed care. Invasive coronary angiography recommended with revascularization per published guideline statements. 6. CAD-RADS 5: Total coronary occlusion (100%). Consider cardiac catheterization or viability assessment. Consider symptom-guided anti-ischemic pharmacotherapy as well as risk factor modification per guideline directed care. 7. CAD-RADS N: Non-diagnostic study. Obstructive CAD can't be excluded. Alternative evaluation is recommended. Wilbert Bihari, MD Electronically Signed: By: Wilbert Bihari M.D. On: 04/17/2023 16:50    Assessment & Plan:   Type II diabetes mellitus with manifestations (HCC)- His blood sugar is well controlled. -     Basic metabolic panel; Future -     Hemoglobin A1c; Future -     HM Diabetes Foot Exam  Hyperlipidemia LDL goal <100 - LDL goal achieved. Doing well on the statin  -     Hepatic function panel; Future -     TSH; Future  GERD with apnea -     CBC with Differential/Platelet; Future  Primary hypertension - His blood pressure is well controlled.     Follow-up: Return in about 6 months (around 12/27/2023).  Debby Molt, MD

## 2023-06-29 NOTE — Patient Instructions (Signed)
 Hypertension, Adult High blood pressure (hypertension) is when the force of blood pumping through the arteries is too strong. The arteries are the blood vessels that carry blood from the heart throughout the body. Hypertension forces the heart to work harder to pump blood and may cause arteries to become narrow or stiff. Untreated or uncontrolled hypertension can lead to a heart attack, heart failure, a stroke, kidney disease, and other problems. A blood pressure reading consists of a higher number over a lower number. Ideally, your blood pressure should be below 120/80. The first ("top") number is called the systolic pressure. It is a measure of the pressure in your arteries as your heart beats. The second ("bottom") number is called the diastolic pressure. It is a measure of the pressure in your arteries as the heart relaxes. What are the causes? The exact cause of this condition is not known. There are some conditions that result in high blood pressure. What increases the risk? Certain factors may make you more likely to develop high blood pressure. Some of these risk factors are under your control, including: Smoking. Not getting enough exercise or physical activity. Being overweight. Having too much fat, sugar, calories, or salt (sodium) in your diet. Drinking too much alcohol. Other risk factors include: Having a personal history of heart disease, diabetes, high cholesterol, or kidney disease. Stress. Having a family history of high blood pressure and high cholesterol. Having obstructive sleep apnea. Age. The risk increases with age. What are the signs or symptoms? High blood pressure may not cause symptoms. Very high blood pressure (hypertensive crisis) may cause: Headache. Fast or irregular heartbeats (palpitations). Shortness of breath. Nosebleed. Nausea and vomiting. Vision changes. Severe chest pain, dizziness, and seizures. How is this diagnosed? This condition is diagnosed by  measuring your blood pressure while you are seated, with your arm resting on a flat surface, your legs uncrossed, and your feet flat on the floor. The cuff of the blood pressure monitor will be placed directly against the skin of your upper arm at the level of your heart. Blood pressure should be measured at least twice using the same arm. Certain conditions can cause a difference in blood pressure between your right and left arms. If you have a high blood pressure reading during one visit or you have normal blood pressure with other risk factors, you may be asked to: Return on a different day to have your blood pressure checked again. Monitor your blood pressure at home for 1 week or longer. If you are diagnosed with hypertension, you may have other blood or imaging tests to help your health care provider understand your overall risk for other conditions. How is this treated? This condition is treated by making healthy lifestyle changes, such as eating healthy foods, exercising more, and reducing your alcohol intake. You may be referred for counseling on a healthy diet and physical activity. Your health care provider may prescribe medicine if lifestyle changes are not enough to get your blood pressure under control and if: Your systolic blood pressure is above 130. Your diastolic blood pressure is above 80. Your personal target blood pressure may vary depending on your medical conditions, your age, and other factors. Follow these instructions at home: Eating and drinking  Eat a diet that is high in fiber and potassium, and low in sodium, added sugar, and fat. An example of this eating plan is called the DASH diet. DASH stands for Dietary Approaches to Stop Hypertension. To eat this way: Eat  plenty of fresh fruits and vegetables. Try to fill one half of your plate at each meal with fruits and vegetables. Eat whole grains, such as whole-wheat pasta, brown rice, or whole-grain bread. Fill about one  fourth of your plate with whole grains. Eat or drink low-fat dairy products, such as skim milk or low-fat yogurt. Avoid fatty cuts of meat, processed or cured meats, and poultry with skin. Fill about one fourth of your plate with lean proteins, such as fish, chicken without skin, beans, eggs, or tofu. Avoid pre-made and processed foods. These tend to be higher in sodium, added sugar, and fat. Reduce your daily sodium intake. Many people with hypertension should eat less than 1,500 mg of sodium a day. Do not drink alcohol if: Your health care provider tells you not to drink. You are pregnant, may be pregnant, or are planning to become pregnant. If you drink alcohol: Limit how much you have to: 0-1 drink a day for women. 0-2 drinks a day for men. Know how much alcohol is in your drink. In the [place] one drink equals one 12 oz bottle of beer (355 mL), one 5 oz glass of wine (148 mL), or one 1 oz glass of hard liquor (44 mL). Lifestyle  Work with your health care provider to maintain a healthy body weight or to lose weight. Ask what an ideal weight is for you. Get at least 30 minutes of exercise that causes your heart to beat faster (aerobic exercise) most days of the week. Activities may include walking, swimming, or biking. Include exercise to strengthen your muscles (resistance exercise), such as Pilates or lifting weights, as part of your weekly exercise routine. Try to do these types of exercises for 30 minutes at least 3 days a week. Do not use any products that contain nicotine or tobacco. These products include cigarettes, chewing tobacco, and vaping devices, such as e-cigarettes. If you need help quitting, ask your health care provider. Monitor your blood pressure at home as told by your health care provider. Keep all follow-up visits. This is important. Medicines Take over-the-counter and prescription medicines only as told by your health care provider. Follow directions carefully. Blood  pressure medicines must be taken as prescribed. Do not skip doses of blood pressure medicine. Doing this puts you at risk for problems and can make the medicine less effective. Ask your health care provider about side effects or reactions to medicines that you should watch for. Contact a health care provider if you: Think you are having a reaction to a medicine you are taking. Have headaches that keep coming back (recurring). Feel dizzy. Have swelling in your ankles. Have trouble with your vision. Get help right away if you: Develop a severe headache or confusion. Have unusual weakness or numbness. Feel faint. Have severe pain in your chest or abdomen. Vomit repeatedly. Have trouble breathing. These symptoms may be an emergency. Get help right away. Call 911. Do not wait to see if the symptoms will go away. Do not drive yourself to the hospital. Summary Hypertension is when the force of blood pumping through your arteries is too strong. If this condition is not controlled, it may put you at risk for serious complications. Your personal target blood pressure may vary depending on your medical conditions, your age, and other factors. For most people, a normal blood pressure is less than 120/80. Hypertension is treated with lifestyle changes, medicines, or a combination of both. Lifestyle changes include losing weight, eating a healthy,  low-sodium diet, exercising more, and limiting alcohol. This information is not intended to replace advice given to you by your health care provider. Make sure you discuss any questions you have with your health care provider. Document Revised: 04/09/2021 Document Reviewed: 04/09/2021 Elsevier Patient Education  2024 [employer].

## 2023-06-30 LAB — TSH: TSH: 2.25 u[IU]/mL (ref 0.35–5.50)

## 2023-07-04 ENCOUNTER — Other Ambulatory Visit: Payer: Self-pay | Admitting: Internal Medicine

## 2023-07-04 DIAGNOSIS — M19071 Primary osteoarthritis, right ankle and foot: Secondary | ICD-10-CM

## 2023-07-04 DIAGNOSIS — M4802 Spinal stenosis, cervical region: Secondary | ICD-10-CM

## 2023-07-07 ENCOUNTER — Other Ambulatory Visit: Payer: Self-pay | Admitting: Internal Medicine

## 2023-07-07 DIAGNOSIS — J4489 Other specified chronic obstructive pulmonary disease: Secondary | ICD-10-CM

## 2023-07-10 DIAGNOSIS — H40053 Ocular hypertension, bilateral: Secondary | ICD-10-CM | POA: Diagnosis not present

## 2023-07-10 DIAGNOSIS — H524 Presbyopia: Secondary | ICD-10-CM | POA: Diagnosis not present

## 2023-07-14 DIAGNOSIS — L814 Other melanin hyperpigmentation: Secondary | ICD-10-CM | POA: Diagnosis not present

## 2023-07-14 DIAGNOSIS — L821 Other seborrheic keratosis: Secondary | ICD-10-CM | POA: Diagnosis not present

## 2023-07-14 DIAGNOSIS — L570 Actinic keratosis: Secondary | ICD-10-CM | POA: Diagnosis not present

## 2023-07-14 DIAGNOSIS — D2361 Other benign neoplasm of skin of right upper limb, including shoulder: Secondary | ICD-10-CM | POA: Diagnosis not present

## 2023-07-14 DIAGNOSIS — Z85828 Personal history of other malignant neoplasm of skin: Secondary | ICD-10-CM | POA: Diagnosis not present

## 2023-07-14 DIAGNOSIS — D692 Other nonthrombocytopenic purpura: Secondary | ICD-10-CM | POA: Diagnosis not present

## 2023-07-14 DIAGNOSIS — D485 Neoplasm of uncertain behavior of skin: Secondary | ICD-10-CM | POA: Diagnosis not present

## 2023-07-14 DIAGNOSIS — D1801 Hemangioma of skin and subcutaneous tissue: Secondary | ICD-10-CM | POA: Diagnosis not present

## 2023-07-14 DIAGNOSIS — D225 Melanocytic nevi of trunk: Secondary | ICD-10-CM | POA: Diagnosis not present

## 2023-07-20 ENCOUNTER — Other Ambulatory Visit: Payer: Self-pay | Admitting: Internal Medicine

## 2023-07-20 DIAGNOSIS — J301 Allergic rhinitis due to pollen: Secondary | ICD-10-CM

## 2023-07-20 DIAGNOSIS — M4802 Spinal stenosis, cervical region: Secondary | ICD-10-CM

## 2023-07-23 ENCOUNTER — Other Ambulatory Visit: Payer: Self-pay | Admitting: Internal Medicine

## 2023-07-23 DIAGNOSIS — L570 Actinic keratosis: Secondary | ICD-10-CM | POA: Diagnosis not present

## 2023-07-23 DIAGNOSIS — I10 Essential (primary) hypertension: Secondary | ICD-10-CM

## 2023-07-23 DIAGNOSIS — E118 Type 2 diabetes mellitus with unspecified complications: Secondary | ICD-10-CM

## 2023-08-22 ENCOUNTER — Encounter: Payer: Self-pay | Admitting: Internal Medicine

## 2023-08-22 ENCOUNTER — Encounter (INDEPENDENT_AMBULATORY_CARE_PROVIDER_SITE_OTHER): Payer: Self-pay

## 2023-09-07 ENCOUNTER — Other Ambulatory Visit: Payer: Self-pay | Admitting: Emergency Medicine

## 2023-09-07 DIAGNOSIS — Z87891 Personal history of nicotine dependence: Secondary | ICD-10-CM

## 2023-09-07 DIAGNOSIS — F17210 Nicotine dependence, cigarettes, uncomplicated: Secondary | ICD-10-CM

## 2023-09-07 DIAGNOSIS — Z122 Encounter for screening for malignant neoplasm of respiratory organs: Secondary | ICD-10-CM

## 2023-09-16 ENCOUNTER — Encounter: Payer: Self-pay | Admitting: Internal Medicine

## 2023-09-25 DIAGNOSIS — M545 Low back pain, unspecified: Secondary | ICD-10-CM | POA: Diagnosis not present

## 2023-09-29 ENCOUNTER — Encounter: Payer: Self-pay | Admitting: Internal Medicine

## 2023-09-29 ENCOUNTER — Ambulatory Visit: Payer: Self-pay

## 2023-09-29 ENCOUNTER — Encounter: Payer: Self-pay | Admitting: Nurse Practitioner

## 2023-09-29 DIAGNOSIS — R052 Subacute cough: Secondary | ICD-10-CM | POA: Diagnosis not present

## 2023-09-29 DIAGNOSIS — R1012 Left upper quadrant pain: Secondary | ICD-10-CM | POA: Diagnosis not present

## 2023-09-29 NOTE — Telephone Encounter (Signed)
 Chief Complaint: Abdominal pain Symptoms: left abdominal pain that is between end of rib cage and pelvis Frequency: started Friday night into Saturday morning Pertinent Negatives: Patient denies CP, SOB Disposition: [] ED /[] Urgent Care (no appt availability in office) / [x] Appointment(In office/virtual)/ []  [facility]/ [] Home Care/ [] Refused Recommended Disposition /[] [facility]/ []  Follow-up with PCP Additional Notes: patient called with concerns for left sided abdominal pain that sits between the end of rib cage and pelvis. Patient states pain started Friday night into Saturday morning. Pain was more intense on Saturday and has decreased since. Pain is a 2-3 out of 10 currently and patient is at work. Per protocol, patient is recommended to be seen in 24 hours. Appointment made for 09/30/2023 at 4:20 PM. Patient verbalized understanding of plan and all questions answered.    Copied from CRM [id number]. Topic: Clinical - Red Word Triage >> Sep 29, 2023  9:01 AM Gurney Maxin H wrote: [facility] that prompted transfer to Nurse Triage: Pain on left side by pancreas not sure if he pulled a muscle, if he lifts left arm side hurts having hard time walking around on Saturday Reason for Disposition  [1] MODERATE pain (e.g., interferes with normal activities) AND [2] pain comes and goes (cramps) AND [3] present > 24 hours  (Exception: Pain with Vomiting or Diarrhea - see that Guideline.)  Answer Assessment - Initial Assessment Questions 1. LOCATION: "Where does it hurt?"      Left abdominal pain in between his pelvis and rib line 2. RADIATION: "Does the pain shoot anywhere else?" (e.g., chest, back)     Radiates around back 3. ONSET: "When did the pain begin?" (Minutes, hours or days ago)      Pain started Friday evening into Saturday 4. SUDDEN: "Gradual or sudden onset?"     gradual 5. PATTERN "Does the pain come and go, or is it constant?"    - If it comes and goes: "How long does  it last?" "Do you have pain now?"     (Note: Comes and goes means the pain is intermittent. It goes away completely between bouts.)    - If constant: "Is it getting better, staying the same, or getting worse?"      (Note: Constant means the pain never goes away completely; most serious pain is constant and gets worse.)      Comes and goes 6. SEVERITY: "How bad is the pain?"  (e.g., Scale 1-10; mild, moderate, or severe)    - MILD (1-3): Doesn't interfere with normal activities, abdomen soft and not tender to touch.     - MODERATE (4-7): Interferes with normal activities or awakens from sleep, abdomen tender to touch.     - SEVERE (8-10): Excruciating pain, doubled over, unable to do any normal activities.       2.5-3 out of 10 currently-pain was 6-7 out of 10 at the worst 7. RECURRENT SYMPTOM: "Have you ever had this type of stomach pain before?" If Yes, ask: "When was the last time?" and "What happened that time?"      no 8. CAUSE: "What do you think is causing the stomach pain?"     Patient is concerned about his pancreas 9. RELIEVING/AGGRAVATING FACTORS: "What makes it better or worse?" (e.g., antacids, bending or twisting motion, bowel movement)     Rest makes it better 10. OTHER SYMPTOMS: "Do you have any other symptoms?" (e.g., back pain, diarrhea, fever, urination pain, vomiting)  Patient states that when he lifts his left arm that he feels the pain in his abdomen, back pain, low grade fever  Protocols used: Abdominal Pain - Male-A-AH

## 2023-09-30 ENCOUNTER — Ambulatory Visit: Admitting: Nurse Practitioner

## 2023-09-30 NOTE — Telephone Encounter (Signed)
 Copied from CRM [id number]. Topic: Clinical - Request for Lab/Test Order >> Sep 30, 2023 11:29 AM Alethia Huxley E wrote: Reason for CRM: Patient went to urgent care yesterday 4/15, and the provider there advised him to get a CAT scan to look at his pancreas because of his lipase readings. Callback number for patient is [phone].

## 2023-10-05 ENCOUNTER — Other Ambulatory Visit: Payer: Self-pay | Admitting: Internal Medicine

## 2023-10-05 ENCOUNTER — Telehealth: Payer: Self-pay | Admitting: Internal Medicine

## 2023-10-05 DIAGNOSIS — R1012 Left upper quadrant pain: Secondary | ICD-10-CM | POA: Diagnosis not present

## 2023-10-05 DIAGNOSIS — R748 Abnormal levels of other serum enzymes: Secondary | ICD-10-CM | POA: Diagnosis not present

## 2023-10-05 DIAGNOSIS — F411 Generalized anxiety disorder: Secondary | ICD-10-CM

## 2023-10-05 NOTE — Telephone Encounter (Signed)
 Copied from CRM [id number]. Topic: Clinical - Request for Lab/Test Order >> Oct 05, 2023  2:04 PM Jeff Norton wrote: Reason for CRM: Patient would like an order for labs so he can check his lipase levels. He is also willing to schedule a follow-up with Dr. Rochelle Chu after doing labs so they can go over aftercare options.

## 2023-10-05 NOTE — Telephone Encounter (Signed)
 I have to see him to order this

## 2023-10-06 ENCOUNTER — Encounter: Payer: Self-pay | Admitting: Internal Medicine

## 2023-10-06 NOTE — Telephone Encounter (Signed)
He has to be seen 

## 2023-10-07 ENCOUNTER — Other Ambulatory Visit: Payer: Self-pay | Admitting: Family Medicine

## 2023-10-07 ENCOUNTER — Encounter: Payer: Self-pay | Admitting: Family Medicine

## 2023-10-07 ENCOUNTER — Ambulatory Visit (INDEPENDENT_AMBULATORY_CARE_PROVIDER_SITE_OTHER): Admitting: Family Medicine

## 2023-10-07 VITALS — BP 102/70 | HR 102 | Temp 98.8°F | Ht 69.0 in | Wt 191.0 lb

## 2023-10-07 DIAGNOSIS — D649 Anemia, unspecified: Secondary | ICD-10-CM | POA: Diagnosis not present

## 2023-10-07 DIAGNOSIS — N401 Enlarged prostate with lower urinary tract symptoms: Secondary | ICD-10-CM

## 2023-10-07 DIAGNOSIS — F411 Generalized anxiety disorder: Secondary | ICD-10-CM

## 2023-10-07 DIAGNOSIS — R350 Frequency of micturition: Secondary | ICD-10-CM

## 2023-10-07 DIAGNOSIS — R748 Abnormal levels of other serum enzymes: Secondary | ICD-10-CM

## 2023-10-07 MED ORDER — TAMSULOSIN HCL 0.4 MG PO CAPS: 0.4000 mg | ORAL_CAPSULE | Freq: Every day | ORAL | 1 refills | Status: DC

## 2023-10-07 MED ORDER — LORAZEPAM 1 MG PO TABS: 1.0000 mg | ORAL_TABLET | Freq: Two times a day (BID) | ORAL | 0 refills | Status: DC

## 2023-10-07 NOTE — Progress Notes (Signed)
 Acute Office Visit  Subjective:     Patient ID: Jeff Norton, male    DOB: [birth year], 58 y.o.   MRN: [id number]  Chief Complaint  Patient presents with   Acute Visit    Discuss Lipase levels, and the next steps    HPI Patient is in today for re evaluation of elevated lipase on 09/29/23 with [facility] visit for LUQ pain. Had abd CT on 10/05/23 that was unremarkable. Has had elevated lipase of 302. Denies previous episodes.  Reports that he has not had any alcohol since 09/27/23, but prior to that was drinking 3 oz bourbon per night. Does still smoke cigarettes.  Inquiring about causes of pancreatitis.  Denies current abdomina pain, nausea, vomiting, diarrhea, rash, fever.  Reports increased urinary frequency and inability came to the bladder well. States that he has taken Myrbetriq in the past from [facility], this was years ago. Is not attempting to treat at home right now. Denies dysuria, hematuria, pelvic pain, other symptoms of UTI.  Requesting refill of Ativan  today.  ROS Per HPI      Objective:    BP 102/70 (BP Location: Left Arm, Patient Position: Sitting)   Pulse (!) 102   Temp 98.8 F (37.1 C) (Temporal)   Ht 5\' 9"  (1.753 m)   Wt 191 lb (86.6 kg)   SpO2 96%   BMI 28.21 kg/m    Physical Exam Vitals and nursing note reviewed.  Constitutional:      General: He is not in acute distress.    Appearance: Normal appearance.  HENT:     Head: Normocephalic and atraumatic.  Eyes:     Extraocular Movements: Extraocular movements intact.  Cardiovascular:     Rate and Rhythm: Normal rate.  Pulmonary:     Effort: Pulmonary effort is normal.  Abdominal:     General: There is no distension.     Palpations: There is no mass.     Tenderness: There is no abdominal tenderness. There is no guarding or rebound.     Hernia: No hernia is present.  Musculoskeletal:        General: Normal range of motion.     Cervical back: Normal range of motion.      Right lower leg: No edema.     Left lower leg: No edema.  Lymphadenopathy:     Cervical: No cervical adenopathy.  Skin:    General: Skin is warm and dry.  Neurological:     General: No focal deficit present.     Mental Status: He is alert and oriented to person, place, and time.  Psychiatric:        Mood and Affect: Mood normal.        Behavior: Behavior normal.     No results found for any visits on 10/07/23.      Assessment & Plan:   Elevated lipase -     CBC with Differential/Platelet -     Comprehensive metabolic panel with GFR -     Lipase -     Amylase  Anemia, unspecified type -     CBC with Differential/Platelet -     Comprehensive metabolic panel with GFR  Benign prostatic hyperplasia with urinary frequency -     Tamsulosin  HCl; Take 1 capsule (0.4 mg total) by mouth daily.  Dispense: 30 capsule; Refill: 1  GAD (generalized anxiety disorder) -     LORazepam ; Take 1 tablet (1 mg total) by mouth 2 (two)  times daily.  Dispense: 60 tablet; Refill: 0  Discussed medications that can cause pancreatitis, diabetes can cause pancreatitis, alcohol, tobacco use  Labs today, labs are abnormal, we will redraw next week to trend.  Meds ordered this encounter  Medications   tamsulosin  (FLOMAX ) 0.4 MG CAPS capsule    Sig: Take 1 capsule (0.4 mg total) by mouth daily.    Dispense:  30 capsule    Refill:  1   LORazepam  (ATIVAN ) 1 MG tablet    Sig: Take 1 tablet (1 mg total) by mouth 2 (two) times daily.    Dispense:  60 tablet    Refill:  0    Return for As scheduled with Dr. Rochelle Chu.  Wellington Half, FNP

## 2023-10-07 NOTE — Patient Instructions (Signed)
 I have sent in Flomax  for you to take once a day in the morning to help with symptoms from BPH.  I refilled your Ativan .  I have also attached information regarding pancreatitis and diet adjustments that could be made to help prevent attacks.  Follow-up with Dr. Rochelle Chu as scheduled.

## 2023-10-08 LAB — LIPASE: Lipase: 56 U/L (ref 11.0–59.0)

## 2023-10-08 LAB — COMPREHENSIVE METABOLIC PANEL WITH GFR
ALT: 21 U/L (ref 0–53)
AST: 35 U/L (ref 0–37)
Albumin: 4.8 g/dL (ref 3.5–5.2)
Alkaline Phosphatase: 57 U/L (ref 39–117)
BUN: 31 mg/dL — ABNORMAL HIGH (ref 6–23)
CO2: 27 meq/L (ref 19–32)
Calcium: 10.9 mg/dL — ABNORMAL HIGH (ref 8.4–10.5)
Chloride: 93 meq/L — ABNORMAL LOW (ref 96–112)
Creatinine, Ser: 0.96 mg/dL (ref 0.40–1.50)
GFR: 86.77 mL/min (ref >60.00)
Glucose, Bld: 141 mg/dL — ABNORMAL HIGH (ref 70–99)
Potassium: 4.7 meq/L (ref 3.5–5.1)
Sodium: 132 meq/L — ABNORMAL LOW (ref 135–145)
Total Bilirubin: 0.7 mg/dL (ref 0.2–1.2)
Total Protein: 8.5 g/dL — ABNORMAL HIGH (ref 6.0–8.3)

## 2023-10-08 LAB — CBC WITH DIFFERENTIAL/PLATELET
Basophils Absolute: 0.1 10*3/uL (ref 0.0–0.1)
Basophils Relative: 1 % (ref 0.0–3.0)
Eosinophils Absolute: 0.4 10*3/uL (ref 0.0–0.7)
Eosinophils Relative: 3.5 % (ref 0.0–5.0)
HCT: 43.1 % (ref 39.0–52.0)
Hemoglobin: 15 g/dL (ref 13.0–17.0)
Lymphocytes Relative: 24.1 % (ref 12.0–46.0)
Lymphs Abs: 3 10*3/uL (ref 0.7–4.0)
MCHC: 34.8 g/dL (ref 30.0–36.0)
MCV: 86.7 fl (ref 78.0–100.0)
Monocytes Absolute: 1.2 10*3/uL — ABNORMAL HIGH (ref 0.1–1.0)
Monocytes Relative: 9.2 % (ref 3.0–12.0)
Neutro Abs: 7.8 10*3/uL — ABNORMAL HIGH (ref 1.4–7.7)
Neutrophils Relative %: 62.2 % (ref 43.0–77.0)
Platelets: 244 10*3/uL (ref 150.0–400.0)
RBC: 4.97 Mil/uL (ref 4.22–5.81)
RDW: 12 % (ref 11.5–15.5)
WBC: 12.6 10*3/uL — ABNORMAL HIGH (ref 4.0–10.5)

## 2023-10-08 LAB — AMYLASE: Amylase: 24 U/L — ABNORMAL LOW (ref 27–131)

## 2023-10-08 NOTE — Telephone Encounter (Signed)
 Patient seen in office on 04/23, has an appt with Dr.Jones 05/08

## 2023-10-09 ENCOUNTER — Encounter: Payer: Self-pay | Admitting: Family Medicine

## 2023-10-20 ENCOUNTER — Encounter: Payer: Self-pay | Admitting: Internal Medicine

## 2023-10-22 ENCOUNTER — Ambulatory Visit (INDEPENDENT_AMBULATORY_CARE_PROVIDER_SITE_OTHER): Admitting: Internal Medicine

## 2023-10-22 ENCOUNTER — Encounter: Payer: Self-pay | Admitting: Internal Medicine

## 2023-10-22 VITALS — BP 116/68 | HR 99 | Temp 98.5°F | Resp 16 | Ht 69.0 in | Wt 189.8 lb

## 2023-10-22 DIAGNOSIS — I959 Hypotension, unspecified: Secondary | ICD-10-CM | POA: Diagnosis not present

## 2023-10-22 DIAGNOSIS — E871 Hypo-osmolality and hyponatremia: Secondary | ICD-10-CM | POA: Diagnosis not present

## 2023-10-22 DIAGNOSIS — N401 Enlarged prostate with lower urinary tract symptoms: Secondary | ICD-10-CM

## 2023-10-22 DIAGNOSIS — E118 Type 2 diabetes mellitus with unspecified complications: Secondary | ICD-10-CM

## 2023-10-22 DIAGNOSIS — R3912 Poor urinary stream: Secondary | ICD-10-CM

## 2023-10-22 DIAGNOSIS — K859 Acute pancreatitis without necrosis or infection, unspecified: Secondary | ICD-10-CM | POA: Diagnosis not present

## 2023-10-22 LAB — CBC WITH DIFFERENTIAL/PLATELET
Basophils Absolute: 0.1 10*3/uL (ref 0.0–0.1)
Basophils Relative: 1.2 % (ref 0.0–3.0)
Eosinophils Absolute: 0.6 10*3/uL (ref 0.0–0.7)
Eosinophils Relative: 5.7 % — ABNORMAL HIGH (ref 0.0–5.0)
HCT: 39.5 % (ref 39.0–52.0)
Hemoglobin: 13.4 g/dL (ref 13.0–17.0)
Lymphocytes Relative: 21.1 % (ref 12.0–46.0)
Lymphs Abs: 2.4 10*3/uL (ref 0.7–4.0)
MCHC: 34 g/dL (ref 30.0–36.0)
MCV: 86.6 fl (ref 78.0–100.0)
Monocytes Absolute: 0.8 10*3/uL (ref 0.1–1.0)
Monocytes Relative: 7.5 % (ref 3.0–12.0)
Neutro Abs: 7.3 10*3/uL (ref 1.4–7.7)
Neutrophils Relative %: 64.5 % (ref 43.0–77.0)
Platelets: 260 10*3/uL (ref 150.0–400.0)
RBC: 4.57 Mil/uL (ref 4.22–5.81)
RDW: 12.1 % (ref 11.5–15.5)
WBC: 11.3 10*3/uL — ABNORMAL HIGH (ref 4.0–10.5)

## 2023-10-22 LAB — BASIC METABOLIC PANEL WITH GFR
BUN: 21 mg/dL (ref 6–23)
CO2: 30 meq/L (ref 19–32)
Calcium: 9.9 mg/dL (ref 8.4–10.5)
Chloride: 98 meq/L (ref 96–112)
Creatinine, Ser: 0.87 mg/dL (ref 0.40–1.50)
GFR: 94.69 mL/min (ref >60.00)
Glucose, Bld: 145 mg/dL — ABNORMAL HIGH (ref 70–99)
Potassium: 4.6 meq/L (ref 3.5–5.1)
Sodium: 136 meq/L (ref 135–145)

## 2023-10-22 LAB — LIPASE: Lipase: 71 U/L — ABNORMAL HIGH (ref 11.0–59.0)

## 2023-10-22 LAB — MICROALBUMIN / CREATININE URINE RATIO
Creatinine,U: 68.4 mg/dL
Microalb Creat Ratio: 62.5 mg/g — ABNORMAL HIGH (ref 0.0–30.0)
Microalb, Ur: 4.3 mg/dL — ABNORMAL HIGH (ref 0.0–1.9)

## 2023-10-22 LAB — URINALYSIS, ROUTINE W REFLEX MICROSCOPIC
Bilirubin Urine: NEGATIVE
Hgb urine dipstick: NEGATIVE
Ketones, ur: NEGATIVE
Leukocytes,Ua: NEGATIVE
Nitrite: NEGATIVE
RBC / HPF: NONE SEEN (ref 0–?)
Specific Gravity, Urine: 1.015 (ref 1.000–1.030)
Total Protein, Urine: NEGATIVE
Urine Glucose: NEGATIVE
Urobilinogen, UA: 0.2 (ref 0.0–1.0)
WBC, UA: NONE SEEN (ref 0–?)
pH: 7 (ref 5.0–8.0)

## 2023-10-22 LAB — HEMOGLOBIN A1C: Hgb A1c MFr Bld: 6.7 % — ABNORMAL HIGH (ref 4.6–6.5)

## 2023-10-22 LAB — TSH: TSH: 2.17 u[IU]/mL (ref 0.35–5.50)

## 2023-10-22 LAB — PSA: PSA: 1.93 ng/mL (ref 0.10–4.00)

## 2023-10-22 LAB — CORTISOL: Cortisol, Plasma: 11.8 ug/dL

## 2023-10-22 NOTE — Progress Notes (Signed)
 Subjective:  Patient ID: Jeff Norton, male    DOB: 10-Mar-1966  Age: 58 y.o. MRN: [id number]  CC: Flank Pain (Patient states that he hasn't been eating like he normally does. ) and Abdominal Pain   HPI RICE BINZ presents for f/up ---  Discussed the use of AI scribe software for clinical note transcription with the patient, who gave verbal consent to proceed.  History of Present Illness   Jeff Norton is a 58 year old male who presents with dizziness and lightheadedness.  A month ago, he experienced an episode of pancreatitis, indicated by a lipase level of 305, accompanied by abdominal pain. A CT scan was performed, which showed no significant findings. The pain has since resolved, and a follow-up lipase test two weeks ago returned normal results. He has been monitoring his diet, eating and drinking in moderation.  He reports weight loss following the pancreatitis episode, which he attributes to fasting and consuming a diet primarily consisting of protein shakes, rice, and chicken or fish. He expected this weight loss due to dietary changes.  For the past two weeks, he has been experiencing dizziness and lightheadedness. Despite these symptoms, he continues to take his blood pressure medication. No nausea, vomiting, or changes in urination. Blood pressure recorded at 90/62, which is low.  He has a history of emphysema, which is associated with wheezing, but he reports no new respiratory symptoms. No recent fever, chills, or symptoms of a viral infection. No difficulty swallowing, heartburn, or indigestion.  He is currently taking Flomax  for urination issues, which he reports is effective.       Outpatient Medications Prior to Visit  Medication Sig Dispense Refill   Albuterol  Sulfate (PROAIR  RESPICLICK) 108 (90 Base) MCG/ACT AEPB INHALE 1 PUFF INTO THE LUNGS THREE TIMES DAILY AS NEEDED 1 each 5   Ascorbic Acid (VITAMIN C) 1000 MG tablet Take 1,000 mg by mouth daily.      cholecalciferol (VITAMIN D) 25 MCG (1000 UNIT) tablet SMARTSIG:1 Tablet(s) By Mouth     cyclobenzaprine  (FLEXERIL ) 10 MG tablet TAKE 1 TABLET BY MOUTH 3 TIMES  DAILY AS NEEDED FOR MUSCLE  SPASMS 300 tablet 0   diclofenac  (VOLTAREN ) 75 MG EC tablet TAKE 1 TABLET BY MOUTH TWICE  DAILY 200 tablet 0   lamoTRIgine  (LAMICTAL ) 200 MG tablet TAKE 1 TABLET BY MOUTH TWICE  DAILY 180 tablet 0   LORazepam  (ATIVAN ) 1 MG tablet Take 1 tablet (1 mg total) by mouth 2 (two) times daily. 60 tablet 0   Lurasidone HCl 120 MG TABS Take 120 mg by mouth daily with breakfast.     MAGNESIUM-ZINC PO Take by mouth.     montelukast  (SINGULAIR ) 10 MG tablet TAKE 1 TABLET BY MOUTH AT  BEDTIME 100 tablet 0   Multiple Vitamin (MULTIVITAMIN) tablet Take 1 tablet by mouth daily.     Multiple Vitamins-Minerals (VITAMIN D3 COMPLETE PO) Take by mouth.     rosuvastatin  (CRESTOR ) 10 MG tablet TAKE 1 TABLET BY MOUTH DAILY 90 tablet 1   sertraline (ZOLOFT) 50 MG tablet Take 50 mg by mouth daily.     TRELEGY ELLIPTA  100-62.5-25 MCG/ACT AEPB INHALE 1 PUFF INTO THE LUNGS DAILY 120 each 0   ZYRTEC ALLERGY 10 MG tablet SMARTSIG:1 By Mouth     olmesartan  (BENICAR ) 20 MG tablet TAKE 1 TABLET BY MOUTH DAILY 100 tablet 0   tamsulosin  (FLOMAX ) 0.4 MG CAPS capsule Take 1 capsule (0.4 mg total) by mouth daily. 30 capsule  1   No facility-administered medications prior to visit.    ROS Review of Systems  Constitutional:  Positive for appetite change and unexpected weight change (wt loss). Negative for activity change, chills, diaphoresis and fatigue.  HENT:  Negative for sore throat and trouble swallowing.   Respiratory:  Positive for shortness of breath. Negative for cough, chest tightness and wheezing.   Cardiovascular:  Negative for chest pain, palpitations and leg swelling.  Gastrointestinal: Negative.  Negative for abdominal pain, constipation, diarrhea, nausea and vomiting.  Genitourinary:  Positive for difficulty urinating. Negative for  dysuria, frequency and hematuria.  Musculoskeletal: Negative.   Skin: Negative.   Neurological:  Positive for dizziness and light-headedness. Negative for seizures and weakness.  Hematological:  Negative for adenopathy. Does not bruise/bleed easily.    Objective:  BP 116/68 (BP Location: Left Arm, Patient Position: Sitting, Cuff Size: Normal)   Pulse 99   Temp 98.5 F (36.9 C) (Oral)   Resp 16   Ht 5\' 9"  (1.753 m)   Wt 189 lb 12.8 oz (86.1 kg)   SpO2 94%   BMI 28.03 kg/m   BP Readings from Last 3 Encounters:  10/22/23 116/68  10/07/23 102/70  06/29/23 134/78    Wt Readings from Last 3 Encounters:  10/22/23 189 lb 12.8 oz (86.1 kg)  10/07/23 191 lb (86.6 kg)  06/29/23 208 lb 6.4 oz (94.5 kg)    Physical Exam Vitals reviewed.  Constitutional:      General: He is not in acute distress.    Appearance: He is ill-appearing. He is not toxic-appearing or diaphoretic.  HENT:     Nose: Nose normal.     Mouth/Throat:     Mouth: Mucous membranes are moist.  Eyes:     General: No scleral icterus.    Conjunctiva/sclera: Conjunctivae normal.  Cardiovascular:     Rate and Rhythm: Normal rate and regular rhythm.     Heart sounds: No murmur heard.    No friction rub. No gallop.     Comments: EKG--- NSR, 92 bpm No LVH, Q waves, or ST/T wave changes  Pulmonary:     Effort: Pulmonary effort is normal.     Breath sounds: No stridor. No wheezing, rhonchi or rales.  Abdominal:     General: Abdomen is flat. Bowel sounds are decreased. There is no distension.     Palpations: Abdomen is soft. There is no hepatomegaly, splenomegaly or mass.     Tenderness: There is no abdominal tenderness. There is no guarding.     Hernia: No hernia is present.  Musculoskeletal:        General: Normal range of motion.     Cervical back: Neck supple.     Right lower leg: No edema.     Left lower leg: No edema.  Lymphadenopathy:     Cervical: No cervical adenopathy.  Skin:    General: Skin is warm  and dry.     Findings: No rash.  Neurological:     General: No focal deficit present.     Mental Status: He is alert. Mental status is at baseline.  Psychiatric:        Mood and Affect: Mood normal.        Behavior: Behavior normal.     Lab Results  Component Value Date   WBC 11.3 (H) 10/22/2023   HGB 13.4 10/22/2023   HCT 39.5 10/22/2023   PLT 260.0 10/22/2023   GLUCOSE 145 (H) 10/22/2023   CHOL 142 11/17/2022  TRIG 155.0 (H) 11/17/2022   HDL 48.20 11/17/2022   LDLDIRECT 73.0 03/10/2022   LDLCALC 63 11/17/2022   ALT 21 10/07/2023   AST 35 10/07/2023   NA 136 10/22/2023   K 4.6 10/22/2023   CL 98 10/22/2023   CREATININE 0.87 10/22/2023   BUN 21 10/22/2023   CO2 30 10/22/2023   TSH 2.17 10/22/2023   PSA 1.93 10/22/2023   INR 1.0 01/11/2020   HGBA1C 6.7 (H) 10/22/2023   MICROALBUR 4.3 (H) 10/22/2023    CT CORONARY MORPH W/CTA COR W/SCORE W/CA W/CM &/OR WO/CM Addendum Date: 05/10/2023 ADDENDUM REPORT: 05/10/2023 02:50 EXAM: OVER-READ INTERPRETATION  CT CHEST The following report is an over-read performed by radiologist Dr. Rufus [facility] on 05/10/2023. This over-read does not include interpretation of cardiac or coronary anatomy or pathology. The coronary CTA interpretation by the cardiologist is attached. COMPARISON:  03/12/2023. FINDINGS: Heart is normal in size and coronary artery calcifications are noted. The aorta and pulmonary trunk are normal in caliber. No mediastinal lymphadenopathy. There is a nonspecific prominent lymph node at the right hilum measuring 1.2 cm. The visualized esophagus is within normal limits. Mild atelectasis is present at the lung bases. No effusion or pneumothorax. No acute abnormality in the upper abdomen. Degenerative changes are noted in the thoracic spine. No acute osseous abnormality. IMPRESSION: 1. Coronary artery calcifications. Electronically Signed   By: Wyvonnia Heimlich M.D.   On: 05/10/2023 02:50   Result Date:  05/10/2023 CLINICAL DATA:  Chest pain EXAM: Cardiac/Coronary CTA TECHNIQUE: A non-contrast, gated CT scan was obtained with axial slices of 3 mm through the heart for calcium  scoring. Calcium  scoring was performed using the Agatston method. A 120 kV prospective, gated, contrast cardiac scan was obtained. Gantry rotation speed was 250 msecs and collimation was 0.6 mm. Two sublingual nitroglycerin  tablets (0.8 mg) were given. The 3D data set was reconstructed in 5% intervals of the 35-75% of the R-R cycle. Diastolic phases were analyzed on a dedicated workstation using MPR, MIP, and VRT modes. The patient received 95 cc of contrast. FINDINGS: Image quality: Excellent. Noise artifact is: Limited. Coronary Arteries:  Normal coronary origin.  Right dominance. Left main: The left main is a large caliber vessel with a normal take off from the left coronary cusp that bifurcates to form a left anterior descending artery and a left circumflex artery. There is no plaque or stenosis. Left anterior descending artery: The LAD gives off 3 patent diagonal branches. There is minimal calcified plaque in the proximal LAD with associated stenosis of <25%. There is mild calcified plaque in the mid LAD with associated stenosis of 25-49%. Left circumflex artery: The LCX is non-dominant and gives off 2 patent obtuse marginal branches. There is minimal calcified plaque in the proximal and mid LCx with associated stenosis of < 25%. Right coronary artery: The RCA is dominant with normal take off from the right coronary cusp. The RCA terminates as a PDA and right posterolateral branch. There is minimal calcified plaque in the proximal and distal RCA with associated stenosis of <25%. Right Atrium: Right atrial size is within normal limits. Right Ventricle: The right ventricular cavity is within normal limits. Left Atrium: Left atrial size is normal in size with no left atrial appendage filling defect. Left Ventricle: The ventricular cavity  size is within normal limits. Pulmonary arteries: Normal in size. Pulmonary veins: Normal pulmonary venous drainage. Pericardium: Normal thickness without significant effusion or calcium  present. Cardiac valves: The aortic valve is  trileaflet without significant calcification. The mitral valve is normal without significant calcification. Aorta: Normal caliber without significant disease. Extra-cardiac findings: See attached radiology report for non-cardiac structures. IMPRESSION: 1. Coronary calcium  score of 179. This was 81st percentile for age-, sex, and race-matched controls. 2. Total plaque volume 275 mm3 which is 53rd percentile for age- and sex-matched controls (calcified plaque 36mm3; non-calcified plaque 263mm3). TPV is severe. 3. Normal coronary origin with right dominance. 4. Mild atherosclerosis: 25-49% mid LAD. 5. Recommend preventive therapy and risk factor modification. 6. Consider non atherosclerotic causes of chest pain. RECOMMENDATIONS: 1. CAD-RADS 0: No evidence of CAD (0%). Consider non-atherosclerotic causes of chest pain. 2. CAD-RADS 1: Minimal non-obstructive CAD (0-24%). Consider non-atherosclerotic causes of chest pain. Consider preventive therapy and risk factor modification. 3. CAD-RADS 2: Mild non-obstructive CAD (25-49%). Consider non-atherosclerotic causes of chest pain. Consider preventive therapy and risk factor modification. 4. CAD-RADS 3: Moderate stenosis. Consider symptom-guided anti-ischemic pharmacotherapy as well as risk factor modification per guideline directed care. Additional analysis with CT FFR will be submitted. 5. CAD-RADS 4: Severe stenosis. (70-99% or > 50% left main). Cardiac catheterization or CT FFR is recommended. Consider symptom-guided anti-ischemic pharmacotherapy as well as risk factor modification per guideline directed care. Invasive coronary angiography recommended with revascularization per published guideline statements. 6. CAD-RADS 5: Total coronary  occlusion (100%). Consider cardiac catheterization or viability assessment. Consider symptom-guided anti-ischemic pharmacotherapy as well as risk factor modification per guideline directed care. 7. CAD-RADS N: Non-diagnostic study. Obstructive CAD can't be excluded. Alternative evaluation is recommended. Gaylyn Keas, MD Electronically Signed: By: Gaylyn Keas M.D. On: 04/17/2023 16:50   CT ABDOMEN PELVIS W CONTRAST, 10/05/2023 4:51 PM  INDICATION: elevated lipase, abd pain, elevated lipase, LUQ pain, Left upper quadrant pain \ R10.12 Left upper quadrant pain \ R74.8 Abnormal levels of other serum enzymes elevated lipase, abd pain COMPARISON: None.  TECHNIQUE: CT images of the abdomen and pelvis were obtained after intravenous administration of iodinated contrast. Conventional axial reconstructions and multiplanar reformatted images were submitted for review.  FINDINGS:  . Lower Chest: Micronodule in the left lower lobe.  . Liver: No suspicious focal findings. . Gallbladder/Biliary: Cholecystectomy with patulous common bile duct likely due to loss of bile reservoir. Aaron Aas Spleen: Unremarkable. Tiny accessory splenule. . Pancreas: Unremarkable. . Adrenals: Unremarkable. . Kidneys: Unremarkable.  . Peritoneum/Mesenteries/Extraperitoneum: No free air. No free fluid or loculated drainable collection. No pathologically enlarged lymph nodes. . Gastrointestinal tract: No evidence of obstruction. Hypodensity contents in the duodenal bulb are favored to be ingested material.  . Ureters: Unremarkable. . Bladder: Unremarkable. . Reproductive System: Prostatomegaly.  . Vascular: Within normal limits. . Musculoskeletal: No acute displaced fractures. Polyarticular degenerative changes. No aggressive focal bony lesions. Abdominal wall soft tissues unremarkable. Procedure Note  Jolane Nations, MD - 10/06/2023 Formatting of this note might be different from the original. CT ABDOMEN PELVIS W CONTRAST,  10/05/2023 4:51 PM  INDICATION: elevated lipase, abd pain, elevated lipase, LUQ pain, Left upper quadrant pain \ R10.12 Left upper quadrant pain \ R74.8 Abnormal levels of other serum enzymes elevated lipase, abd pain COMPARISON: None.  TECHNIQUE: CT images of the abdomen and pelvis were obtained after intravenous administration of iodinated contrast. Conventional axial reconstructions and multiplanar reformatted images were submitted for review.  FINDINGS:  . Lower Chest: Micronodule in the left lower lobe.  . Liver: No suspicious focal findings. . Gallbladder/Biliary: Cholecystectomy with patulous common bile duct likely due to loss of bile reservoir. Aaron Aas Spleen: Unremarkable. Tiny accessory splenule. Aaron Aas  Pancreas: Unremarkable. . Adrenals: Unremarkable. . Kidneys: Unremarkable.  . Peritoneum/Mesenteries/Extraperitoneum: No free air. No free fluid or loculated drainable collection. No pathologically enlarged lymph nodes. . Gastrointestinal tract: No evidence of obstruction. Hypodensity contents in the duodenal bulb are favored to be ingested material.  . Ureters: Unremarkable. . Bladder: Unremarkable. . Reproductive System: Prostatomegaly.  . Vascular: Within normal limits. . Musculoskeletal: No acute displaced fractures. Polyarticular degenerative changes. No aggressive focal bony lesions. Abdominal wall soft tissues unremarkable.   IMPRESSION: No acute findings in the abdomen or pelvis. Exam End: 10/05/23 16:51   Specimen Collected: 10/06/23 08:40 Last Resulted: 10/06/23 10:02    Assessment & Plan:   Hypotension, unspecified hypotension type- EKG is normal. Labs are normal. Wold the ARB. -     EKG 12-Lead -     TSH; Future -     Urinalysis, Routine w reflex microscopic; Future -     CBC with Differential/Platelet; Future -     Basic metabolic panel with GFR; Future -     Cortisol; Future  Hyponatremia- Na+ is normal now. -     Urinalysis, Routine w reflex microscopic;  Future -     Sodium, urine, random; Future -     Basic metabolic panel with GFR; Future -     Cortisol; Future  Type II diabetes mellitus with manifestations (HCC)- Blood sugar is well controlled. -     Urinalysis, Routine w reflex microscopic; Future -     Hemoglobin A1c; Future -     Microalbumin / creatinine urine ratio; Future -     Basic metabolic panel with GFR; Future  Acute pancreatitis without infection or necrosis, unspecified pancreatitis type- Improvement noted. -     Lipase; Future -     CBC with Differential/Platelet; Future -     Basic metabolic panel with GFR; Future  Benign prostatic hyperplasia with weak urinary stream -     PSA; Future -     Tamsulosin  HCl; Take 1 capsule (0.4 mg total) by mouth daily.  Dispense: 90 capsule; Refill: 1  Benign prostatic hyperplasia with urinary frequency     Follow-up: Return in about 3 months (around 01/22/2024).  Sandra Crouch, MD

## 2023-10-22 NOTE — Patient Instructions (Signed)
Acute Pancreatitis  Acute pancreatitis happens when a gland called the pancreas suddenly develops inflammation, making it irritated and swollen. The pancreas is found on the left side of the abdomen, behind the stomach. The pancreas makes proteins (enzymes) that help to digest food. It also releases the hormones glucagon and insulin. These help to regulate blood sugar. Most sudden (acute) attacks of this condition last a few days and can cause serious problems. Some people become dehydrated and develop low blood pressure. In severe cases, bleeding in the abdomen can lead to shock and can be life-threatening. The lungs, heart, and kidneys may stop working. What are the causes? This condition may be caused by: Heavy alcohol use. Drug use. Gallstones or other conditions that can block the tube that drains the pancreas (pancreatic duct). A tumor in the pancreas. Other causes include: Being exposed to certain medicines or certain chemicals. Having health conditions such as diabetes, high triglycerides, or high calcium levels in your blood. High calcium levels are usually caused by the parathyroid gland being too active. An infection in the pancreas. Damage caused by an accident (trauma) or by the poison (venom) of a scorpion sting. Abdominal surgery. Autoimmune pancreatitis. This is when the body's disease-fighting system (immune system) attacks the pancreas. Genes that are passed from parent to child (inherited). In some cases, the cause of this condition is not known. What are the signs or symptoms? Symptoms of this condition include: Pain in the upper abdomen that may spread (radiate) to the back. Pain may be severe and often worsens after you eat. A tender and swollen abdomen. Nausea and vomiting. Fever. How is this diagnosed? This condition may be diagnosed based on: A physical exam. Blood tests. These include an increased (elevated) level of lipase or amylase. Imaging tests, such as CT  scans, MRIs, or an ultrasound of the abdomen. How is this treated? Treatment for this condition often requires a hospital stay and may include: Pain medicine. IV fluids. Placing a tube in the stomach to remove stomach contents and to control vomiting (nasogastric tube, or NG tube). Not eating until vomiting has lessened. Treating any underlying conditions that may be the cause. Treatment may include: Antibiotic medicines, if your condition is caused by an infection. Steroid medicine, if your condition is caused by your immune system attacking your pancreas (autoimmune disease). Surgery on the gallbladder or pancreas, if your condition is caused by gallstones or another blockage. Follow these instructions at home: Medicines Take over-the-counter and prescription medicines only as told by your health care provider. If you were prescribed an antibiotic medicine, take it as told by your health care provider. Do not stop using the antibiotic even if you start to feel better. Ask your health care provider if the medicine prescribed to you: Requires you to avoid driving or using machinery. Can cause constipation. You may need to take these actions to prevent or treat constipation: Take over-the-counter or prescription medicines. Eat foods that are high in fiber, such as beans, whole grains, and fresh fruits and vegetables. Limit foods that are high in fat and processed sugars, such as fried or sweet foods. Eating and drinking  Follow instructions from your health care provider about diet. This may involve avoiding alcohol and having less fat in your diet. Eat smaller, more frequent meals. Doing this causes the pancreas to make less digestive fluid. Drink enough fluid to keep your urine pale yellow. Do not drink alcohol if it caused your condition. General instructions Do not use  any products that contain nicotine or tobacco. These products include cigarettes, chewing tobacco, and vaping devices,  such as e-cigarettes. If you need help quitting, ask your health care provider. Get plenty of rest. If directed, check your blood sugar at home as told by your health care provider. Keep all follow-up visits. This is important. Contact a health care provider if: You do not get better as fast as expected. Your symptoms get worse or you get new symptoms. You keep having pain, weakness, or nausea. You get better and then pain comes back. You have a fever. Get help right away if: You vomit every time you eat or drink. Your pain becomes severe. Your skin or the white parts of your eyes turn yellow (jaundice). You have sudden swelling in your abdomen. You feel dizzy or you faint. Your blood sugar is high (over 300 mg/dL). You vomit blood. These symptoms may be an emergency. Get help right away. Call 911. Do not wait to see if the symptoms will go away. Do not drive yourself to the hospital. Summary Acute pancreatitis happens when inflammation of the pancreas suddenly occurs and the pancreas becomes irritated and swollen. This condition is typically caused by heavy alcohol use, drug use, or gallstones. Treatment for this condition usually requires a stay in the hospital. This information is not intended to replace advice given to you by your health care provider. Make sure you discuss any questions you have with your health care provider. Document Revised: 04/23/2021 Document Reviewed: 04/23/2021 Elsevier Patient Education  2024 [employer].

## 2023-10-23 DIAGNOSIS — K859 Acute pancreatitis without necrosis or infection, unspecified: Secondary | ICD-10-CM | POA: Insufficient documentation

## 2023-10-23 LAB — SODIUM, URINE, RANDOM: Sodium, Ur: 65 mmol/L (ref 28–272)

## 2023-10-23 MED ORDER — TAMSULOSIN HCL 0.4 MG PO CAPS: 0.4000 mg | ORAL_CAPSULE | Freq: Every day | ORAL | 1 refills | Status: DC

## 2023-10-26 ENCOUNTER — Ambulatory Visit
Admission: RE | Admit: 2023-10-26 | Discharge: 2023-10-26 | Disposition: A | Discharge status: Home / Self Care | Source: Ambulatory Visit | Attending: Acute Care | Admitting: Acute Care

## 2023-10-26 DIAGNOSIS — Z87891 Personal history of nicotine dependence: Secondary | ICD-10-CM

## 2023-10-26 DIAGNOSIS — F17210 Nicotine dependence, cigarettes, uncomplicated: Secondary | ICD-10-CM | POA: Diagnosis not present

## 2023-10-26 DIAGNOSIS — G4733 Obstructive sleep apnea (adult) (pediatric): Secondary | ICD-10-CM | POA: Diagnosis not present

## 2023-10-26 DIAGNOSIS — Z122 Encounter for screening for malignant neoplasm of respiratory organs: Secondary | ICD-10-CM

## 2023-10-30 DIAGNOSIS — J019 Acute sinusitis, unspecified: Secondary | ICD-10-CM | POA: Diagnosis not present

## 2023-11-06 ENCOUNTER — Other Ambulatory Visit: Payer: Self-pay | Admitting: Internal Medicine

## 2023-11-06 DIAGNOSIS — M4802 Spinal stenosis, cervical region: Secondary | ICD-10-CM

## 2023-11-07 ENCOUNTER — Other Ambulatory Visit: Payer: Self-pay | Admitting: Internal Medicine

## 2023-11-07 DIAGNOSIS — J4489 Other specified chronic obstructive pulmonary disease: Secondary | ICD-10-CM

## 2023-11-10 ENCOUNTER — Other Ambulatory Visit: Payer: Self-pay | Admitting: Internal Medicine

## 2023-11-10 DIAGNOSIS — J4489 Other specified chronic obstructive pulmonary disease: Secondary | ICD-10-CM

## 2023-11-10 NOTE — Telephone Encounter (Unsigned)
 Copied from CRM [id number]. Topic: Clinical - Medication Refill >> Nov 10, 2023 10:17 AM Kevelyn M wrote: Medication: TRELEGY ELLIPTA  100-62.5-25 MCG/ACT AEPB  Has the patient contacted their pharmacy? Yes (Agent: If no, request that the patient contact the pharmacy for the refill. If patient does not wish to contact the pharmacy document the reason why and proceed with request.) (Agent: If yes, when and what did the pharmacy advise?)  This is the patient's preferred pharmacy:   [id number] - [place], [place] - [address] AT [facility] OF [address] [place] [id number] Phone: [phone] Fax: [phone]  Is this the correct pharmacy for this prescription? Yes If no, delete pharmacy and type the correct one.   Has the prescription been filled recently? No  Is the patient out of the medication? Yes  Has the patient been seen for an appointment in the last year OR does the patient have an upcoming appointment? Yes  Can we respond through MyChart? Yes  Agent: Please be advised that Rx refills may take up to 3 business days. We ask that you follow-up with your pharmacy.  Patient wants to know why he has to contact us  every every 6 months for a refill.

## 2023-11-12 ENCOUNTER — Other Ambulatory Visit: Payer: Self-pay | Admitting: Internal Medicine

## 2023-11-12 ENCOUNTER — Encounter: Payer: Self-pay | Admitting: Internal Medicine

## 2023-11-12 DIAGNOSIS — J4489 Other specified chronic obstructive pulmonary disease: Secondary | ICD-10-CM

## 2023-11-12 MED ORDER — TRELEGY ELLIPTA 100-62.5-25 MCG/ACT IN AEPB: 1.0000 | INHALATION_SPRAY | Freq: Every day | RESPIRATORY_TRACT | 1 refills | Status: DC

## 2023-11-13 ENCOUNTER — Other Ambulatory Visit: Payer: Self-pay | Admitting: Internal Medicine

## 2023-11-13 DIAGNOSIS — J4489 Other specified chronic obstructive pulmonary disease: Secondary | ICD-10-CM

## 2023-11-14 ENCOUNTER — Telehealth: Admitting: Nurse Practitioner

## 2023-11-14 DIAGNOSIS — J019 Acute sinusitis, unspecified: Secondary | ICD-10-CM | POA: Diagnosis not present

## 2023-11-14 DIAGNOSIS — B9689 Other specified bacterial agents as the cause of diseases classified elsewhere: Secondary | ICD-10-CM | POA: Diagnosis not present

## 2023-11-14 MED ORDER — DOXYCYCLINE HYCLATE 100 MG PO TABS: 100.0000 mg | ORAL_TABLET | Freq: Two times a day (BID) | ORAL | 0 refills | Status: AC

## 2023-11-14 NOTE — Progress Notes (Signed)
 I have spent 5 minutes in review of e-visit questionnaire, review and updating patient chart, medical decision making and response to patient.   Claiborne Rigg, NP

## 2023-11-14 NOTE — Progress Notes (Signed)
 E-Visit for Sinus Problems  Hello [name]. I have sent doxycycline  however I can not see the cost of the antibiotic. Augmentin  is not usually expensive so I am not sure why your insuracne is not covering it. They may or may not cover doxycycline . I am assuming you have a prescription drug plan or PART D. If not then this would be why the medication is expensive.   We are sorry that you are not feeling well.  Here is how we plan to help!  Based on what you have shared with me it looks like you have sinusitis.  Sinusitis is inflammation and infection in the sinus cavities of the head.  Based on your presentation I believe you most likely have Acute Bacterial Sinusitis.  This is an infection caused by bacteria and is treated with antibiotics. I have prescribed Doxycycline  100mg  by mouth twice a day for 10 days. You may use an oral decongestant such as Mucinex  D or if you have glaucoma or high blood pressure use plain Mucinex . Saline nasal spray help and can safely be used as often as needed for congestion.  If you develop worsening sinus pain, fever or notice severe headache and vision changes, or if symptoms are not better after completion of antibiotic, please schedule an appointment with a health care provider.    Sinus infections are not as easily transmitted as other respiratory infection, however we still recommend that you avoid close contact with loved ones, especially the very young and elderly.  Remember to wash your hands thoroughly throughout the day as this is the number one way to prevent the spread of infection!  Home Care: Only take medications as instructed by your medical team. Complete the entire course of an antibiotic. Do not take these medications with alcohol. A steam or ultrasonic humidifier can help congestion.  You can place a towel over your head and breathe in the steam from hot water coming from a faucet. Avoid close contacts especially the very young and the  elderly. Cover your mouth when you cough or sneeze. Always remember to wash your hands.  Get Help Right Away If: You develop worsening fever or sinus pain. You develop a severe head ache or visual changes. Your symptoms persist after you have completed your treatment plan.  Make sure you Understand these instructions. Will watch your condition. Will get help right away if you are not doing well or get worse.  Thank you for choosing an e-visit.  Your e-visit answers were reviewed by a board certified advanced clinical practitioner to complete your personal care plan. Depending upon the condition, your plan could have included both over the counter or prescription medications.  Please review your pharmacy choice. Make sure the pharmacy is open so you can pick up prescription now. If there is a problem, you may contact your provider through [employer] and have the prescription routed to another pharmacy.  Your safety is important to us . If you have drug allergies check your prescription carefully.   For the next 24 hours you can use MyChart to ask questions about today's visit, request a non-urgent call back, or ask for a work or school excuse. You will get an email in the next two days asking about your experience. I hope that your e-visit has been valuable and will speed your recovery.

## 2023-11-18 ENCOUNTER — Other Ambulatory Visit: Payer: Self-pay | Admitting: Acute Care

## 2023-11-18 DIAGNOSIS — Z87891 Personal history of nicotine dependence: Secondary | ICD-10-CM

## 2023-11-18 DIAGNOSIS — Z122 Encounter for screening for malignant neoplasm of respiratory organs: Secondary | ICD-10-CM

## 2023-11-18 DIAGNOSIS — F17210 Nicotine dependence, cigarettes, uncomplicated: Secondary | ICD-10-CM

## 2023-11-19 ENCOUNTER — Encounter: Payer: Self-pay | Admitting: Internal Medicine

## 2023-11-23 ENCOUNTER — Other Ambulatory Visit: Payer: Self-pay | Admitting: Internal Medicine

## 2023-11-23 DIAGNOSIS — J324 Chronic pansinusitis: Secondary | ICD-10-CM

## 2023-11-25 ENCOUNTER — Other Ambulatory Visit: Payer: Self-pay

## 2023-11-25 DIAGNOSIS — J324 Chronic pansinusitis: Secondary | ICD-10-CM

## 2023-12-01 ENCOUNTER — Encounter (INDEPENDENT_AMBULATORY_CARE_PROVIDER_SITE_OTHER): Payer: Self-pay

## 2023-12-31 ENCOUNTER — Telehealth: Admitting: Physician Assistant

## 2023-12-31 ENCOUNTER — Encounter (INDEPENDENT_AMBULATORY_CARE_PROVIDER_SITE_OTHER): Payer: Self-pay

## 2023-12-31 DIAGNOSIS — B9689 Other specified bacterial agents as the cause of diseases classified elsewhere: Secondary | ICD-10-CM

## 2023-12-31 DIAGNOSIS — J019 Acute sinusitis, unspecified: Secondary | ICD-10-CM

## 2023-12-31 MED ORDER — DOXYCYCLINE HYCLATE 100 MG PO TABS: 100.0000 mg | ORAL_TABLET | Freq: Two times a day (BID) | ORAL | 0 refills | Status: DC

## 2023-12-31 MED ORDER — PREDNISONE 10 MG (21) PO TBPK: ORAL_TABLET | ORAL | 0 refills | Status: DC

## 2023-12-31 NOTE — Progress Notes (Signed)

## 2023-12-31 NOTE — Progress Notes (Signed)
 I have spent 5 minutes in review of e-visit questionnaire, review and updating patient chart, medical decision making and response to patient.   Piedad Climes, PA-C

## 2023-12-31 NOTE — Addendum Note (Signed)
 Addended by: GLADIS ELSIE BROCKS on: 12/31/2023 02:44 PM   Modules accepted: Orders

## 2024-01-05 ENCOUNTER — Other Ambulatory Visit: Payer: Self-pay | Admitting: Internal Medicine

## 2024-01-05 DIAGNOSIS — R7303 Prediabetes: Secondary | ICD-10-CM | POA: Diagnosis not present

## 2024-01-05 DIAGNOSIS — N401 Enlarged prostate with lower urinary tract symptoms: Secondary | ICD-10-CM

## 2024-01-05 DIAGNOSIS — H40053 Ocular hypertension, bilateral: Secondary | ICD-10-CM | POA: Diagnosis not present

## 2024-01-11 ENCOUNTER — Encounter: Payer: Self-pay | Admitting: Internal Medicine

## 2024-01-13 ENCOUNTER — Other Ambulatory Visit: Payer: Self-pay | Admitting: Internal Medicine

## 2024-01-13 DIAGNOSIS — F319 Bipolar disorder, unspecified: Secondary | ICD-10-CM | POA: Insufficient documentation

## 2024-01-25 ENCOUNTER — Other Ambulatory Visit: Payer: Self-pay | Admitting: Family

## 2024-01-25 ENCOUNTER — Encounter (INDEPENDENT_AMBULATORY_CARE_PROVIDER_SITE_OTHER): Payer: Self-pay | Admitting: Otolaryngology

## 2024-01-25 ENCOUNTER — Other Ambulatory Visit: Payer: Self-pay | Admitting: Internal Medicine

## 2024-01-25 ENCOUNTER — Ambulatory Visit (INDEPENDENT_AMBULATORY_CARE_PROVIDER_SITE_OTHER): Admitting: Otolaryngology

## 2024-01-25 VITALS — BP 137/81 | HR 88 | Ht 69.0 in | Wt 192.0 lb

## 2024-01-25 DIAGNOSIS — J310 Chronic rhinitis: Secondary | ICD-10-CM

## 2024-01-25 DIAGNOSIS — R040 Epistaxis: Secondary | ICD-10-CM

## 2024-01-25 DIAGNOSIS — J343 Hypertrophy of nasal turbinates: Secondary | ICD-10-CM

## 2024-01-25 DIAGNOSIS — J324 Chronic pansinusitis: Secondary | ICD-10-CM

## 2024-01-25 DIAGNOSIS — J3489 Other specified disorders of nose and nasal sinuses: Secondary | ICD-10-CM | POA: Diagnosis not present

## 2024-01-25 DIAGNOSIS — M4802 Spinal stenosis, cervical region: Secondary | ICD-10-CM

## 2024-01-25 DIAGNOSIS — F17210 Nicotine dependence, cigarettes, uncomplicated: Secondary | ICD-10-CM | POA: Diagnosis not present

## 2024-01-25 DIAGNOSIS — M19071 Primary osteoarthritis, right ankle and foot: Secondary | ICD-10-CM

## 2024-01-27 ENCOUNTER — Ambulatory Visit (HOSPITAL_COMMUNITY)
Admission: RE | Admit: 2024-01-27 | Discharge: 2024-01-27 | Disposition: A | Discharge status: Home / Self Care | Source: Ambulatory Visit | Attending: Otolaryngology | Admitting: Otolaryngology

## 2024-01-27 ENCOUNTER — Encounter (HOSPITAL_COMMUNITY): Payer: Self-pay

## 2024-01-27 DIAGNOSIS — J323 Chronic sphenoidal sinusitis: Secondary | ICD-10-CM | POA: Diagnosis not present

## 2024-01-27 DIAGNOSIS — J343 Hypertrophy of nasal turbinates: Secondary | ICD-10-CM | POA: Insufficient documentation

## 2024-01-27 DIAGNOSIS — J324 Chronic pansinusitis: Secondary | ICD-10-CM | POA: Diagnosis not present

## 2024-01-27 DIAGNOSIS — J3489 Other specified disorders of nose and nasal sinuses: Secondary | ICD-10-CM | POA: Insufficient documentation

## 2024-01-27 DIAGNOSIS — J320 Chronic maxillary sinusitis: Secondary | ICD-10-CM | POA: Diagnosis not present

## 2024-01-27 DIAGNOSIS — J321 Chronic frontal sinusitis: Secondary | ICD-10-CM | POA: Diagnosis not present

## 2024-01-27 DIAGNOSIS — R040 Epistaxis: Secondary | ICD-10-CM | POA: Insufficient documentation
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
## 2024-02-04 ENCOUNTER — Encounter (INDEPENDENT_AMBULATORY_CARE_PROVIDER_SITE_OTHER): Payer: Self-pay | Admitting: Otolaryngology

## 2024-02-04 ENCOUNTER — Ambulatory Visit (INDEPENDENT_AMBULATORY_CARE_PROVIDER_SITE_OTHER): Admitting: Otolaryngology

## 2024-02-04 VITALS — BP 112/71 | HR 78

## 2024-02-04 DIAGNOSIS — J343 Hypertrophy of nasal turbinates: Secondary | ICD-10-CM

## 2024-02-04 DIAGNOSIS — J324 Chronic pansinusitis: Secondary | ICD-10-CM | POA: Diagnosis not present

## 2024-02-04 DIAGNOSIS — J3489 Other specified disorders of nose and nasal sinuses: Secondary | ICD-10-CM | POA: Diagnosis not present

## 2024-02-04 DIAGNOSIS — R040 Epistaxis: Secondary | ICD-10-CM | POA: Diagnosis not present

## 2024-02-04 DIAGNOSIS — J342 Deviated nasal septum: Secondary | ICD-10-CM | POA: Diagnosis not present

## 2024-02-07 NOTE — Addendum Note (Signed)
 Addended byBETHA KARIS CLUNES on: 02/07/2024 08:10 AM   Modules accepted: Orders
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
## 2024-02-08 ENCOUNTER — Ambulatory Visit (INDEPENDENT_AMBULATORY_CARE_PROVIDER_SITE_OTHER): Admitting: Otolaryngology

## 2024-02-08 ENCOUNTER — Encounter: Payer: Self-pay | Admitting: Internal Medicine

## 2024-02-10 ENCOUNTER — Ambulatory Visit: Payer: Medicare Other

## 2024-02-11 NOTE — Telephone Encounter (Signed)
 Please advise if patient should be taking a higher dosage

## 2024-02-12 ENCOUNTER — Other Ambulatory Visit: Payer: Self-pay | Admitting: Internal Medicine

## 2024-02-12 DIAGNOSIS — N401 Enlarged prostate with lower urinary tract symptoms: Secondary | ICD-10-CM

## 2024-02-12 MED ORDER — TAMSULOSIN HCL 0.4 MG PO CAPS: 0.8000 mg | ORAL_CAPSULE | Freq: Every day | ORAL | 0 refills | Status: DC

## 2024-02-20 ENCOUNTER — Encounter: Payer: Self-pay | Admitting: Internal Medicine

## 2024-02-22 ENCOUNTER — Other Ambulatory Visit: Payer: Self-pay

## 2024-02-22 DIAGNOSIS — M4802 Spinal stenosis, cervical region: Secondary | ICD-10-CM

## 2024-02-22 MED ORDER — CYCLOBENZAPRINE HCL 10 MG PO TABS: 10.0000 mg | ORAL_TABLET | Freq: Three times a day (TID) | ORAL | 0 refills | Status: DC | PRN

## 2024-03-06 ENCOUNTER — Telehealth: Admitting: Physician Assistant

## 2024-03-06 DIAGNOSIS — J019 Acute sinusitis, unspecified: Secondary | ICD-10-CM

## 2024-03-06 DIAGNOSIS — B9689 Other specified bacterial agents as the cause of diseases classified elsewhere: Secondary | ICD-10-CM

## 2024-03-07 ENCOUNTER — Other Ambulatory Visit: Payer: Self-pay | Admitting: Family Medicine

## 2024-03-07 ENCOUNTER — Encounter: Payer: Self-pay | Admitting: Internal Medicine

## 2024-03-07 ENCOUNTER — Other Ambulatory Visit: Payer: Self-pay | Admitting: Internal Medicine

## 2024-03-07 DIAGNOSIS — F411 Generalized anxiety disorder: Secondary | ICD-10-CM

## 2024-03-07 DIAGNOSIS — J301 Allergic rhinitis due to pollen: Secondary | ICD-10-CM

## 2024-03-07 DIAGNOSIS — E785 Hyperlipidemia, unspecified: Secondary | ICD-10-CM

## 2024-03-07 MED ORDER — DOXYCYCLINE HYCLATE 100 MG PO TABS: 100.0000 mg | ORAL_TABLET | Freq: Two times a day (BID) | ORAL | 0 refills | Status: DC

## 2024-03-07 MED ORDER — PREDNISONE 10 MG (21) PO TBPK: ORAL_TABLET | ORAL | 0 refills | Status: DC

## 2024-03-07 NOTE — Telephone Encounter (Signed)
 Please advise, last filled by stephanie matthews in April

## 2024-03-07 NOTE — Progress Notes (Signed)

## 2024-03-07 NOTE — Progress Notes (Signed)
 I have spent 5 minutes in review of e-visit questionnaire, review and updating patient chart, medical decision making and response to patient.   Elsie Velma Lunger, PA-C

## 2024-03-07 NOTE — Addendum Note (Signed)
 Addended by: GLADIS ELSIE BROCKS on: 03/07/2024 07:31 AM   Modules accepted: Orders

## 2024-03-22 DIAGNOSIS — J342 Deviated nasal septum: Secondary | ICD-10-CM | POA: Diagnosis not present

## 2024-03-22 DIAGNOSIS — J338 Other polyp of sinus: Secondary | ICD-10-CM | POA: Diagnosis not present

## 2024-03-22 DIAGNOSIS — J329 Chronic sinusitis, unspecified: Secondary | ICD-10-CM | POA: Diagnosis not present

## 2024-03-22 DIAGNOSIS — J321 Chronic frontal sinusitis: Secondary | ICD-10-CM | POA: Diagnosis not present

## 2024-03-22 DIAGNOSIS — J343 Hypertrophy of nasal turbinates: Secondary | ICD-10-CM | POA: Diagnosis not present

## 2024-03-22 DIAGNOSIS — J339 Nasal polyp, unspecified: Secondary | ICD-10-CM | POA: Diagnosis not present

## 2024-03-22 DIAGNOSIS — J3489 Other specified disorders of nose and nasal sinuses: Secondary | ICD-10-CM | POA: Diagnosis not present

## 2024-03-22 DIAGNOSIS — J320 Chronic maxillary sinusitis: Secondary | ICD-10-CM | POA: Diagnosis not present

## 2024-03-22 DIAGNOSIS — J322 Chronic ethmoidal sinusitis: Secondary | ICD-10-CM | POA: Diagnosis not present

## 2024-03-22 HISTORY — PX: OTHER SURGICAL HISTORY: SHX169

## 2024-03-24 ENCOUNTER — Encounter (INDEPENDENT_AMBULATORY_CARE_PROVIDER_SITE_OTHER): Payer: Self-pay | Admitting: Otolaryngology

## 2024-03-25 ENCOUNTER — Ambulatory Visit (INDEPENDENT_AMBULATORY_CARE_PROVIDER_SITE_OTHER): Admitting: Otolaryngology

## 2024-03-25 ENCOUNTER — Encounter (INDEPENDENT_AMBULATORY_CARE_PROVIDER_SITE_OTHER): Payer: Self-pay | Admitting: Otolaryngology

## 2024-03-25 VITALS — BP 114/74 | HR 60 | Ht 69.0 in | Wt 177.0 lb

## 2024-03-25 DIAGNOSIS — J320 Chronic maxillary sinusitis: Secondary | ICD-10-CM

## 2024-03-25 DIAGNOSIS — J322 Chronic ethmoidal sinusitis: Secondary | ICD-10-CM

## 2024-03-25 DIAGNOSIS — J321 Chronic frontal sinusitis: Secondary | ICD-10-CM

## 2024-03-25 DIAGNOSIS — J338 Other polyp of sinus: Secondary | ICD-10-CM

## 2024-03-26 DIAGNOSIS — J321 Chronic frontal sinusitis: Secondary | ICD-10-CM | POA: Insufficient documentation

## 2024-03-26 DIAGNOSIS — J320 Chronic maxillary sinusitis: Secondary | ICD-10-CM | POA: Insufficient documentation

## 2024-03-26 DIAGNOSIS — J322 Chronic ethmoidal sinusitis: Secondary | ICD-10-CM | POA: Insufficient documentation

## 2024-03-26 DIAGNOSIS — J338 Other polyp of sinus: Secondary | ICD-10-CM | POA: Insufficient documentation

## 2024-03-26 NOTE — Progress Notes (Signed)
 Patient ID: Jeff Norton, male   DOB: 1966/04/28, 58 y.o.   MRN: [id number]  Procedure: Right nasal/sinus debridement status post endoscopic sinus surgery.  Indication: The patient previously underwent right endoscopic sinus surgery to treat his chronic right sided sinusitis and polyposis.  Intraoperatively, a large amount of necrotic granulation tissue was noted within his nasal cavities.  The pathology report is still pending.  The patient returns today complaining of significant nasal congestion and crusting.  He had a moderate amount of bleeding from the nasal cavities.  Currently the patient denies any facial pain, fever, or visual change.  Anesthesia: Topical Xylocaine and Neo-Synephrine.  Description: The patient is placed upright in the exam chair.  The right nasal cavity is sprayed with topical Xylocaine and Neo-Synephrine.  A 0 rigid endoscope is used for the examination. The scope is first advanced past the right nostril into the right nasal cavity. A significant amount of crusting and blood clots are noted within the right nasal cavity and the maxillary/ethmoid cavities.  The crusting and blood clots are removed with suction catheters and alligator forceps, which are inserted in parallel with the rigid endoscope.  After the debridement procedure, the maxillary antrum and the ethmoid cavities are noted to be patent.  The frontal recess is also patent.  The patient tolerated the procedure well.  Follow up care: The patient is instructed to perform daily nasal saline irrigation.  The patient will return for re-evaluation in approximately 3 weeks.

## 2024-03-27 ENCOUNTER — Other Ambulatory Visit: Payer: Self-pay | Admitting: Family

## 2024-03-27 ENCOUNTER — Other Ambulatory Visit: Payer: Self-pay | Admitting: Internal Medicine

## 2024-03-27 DIAGNOSIS — J4489 Other specified chronic obstructive pulmonary disease: Secondary | ICD-10-CM

## 2024-03-27 DIAGNOSIS — M4802 Spinal stenosis, cervical region: Secondary | ICD-10-CM

## 2024-03-29 ENCOUNTER — Other Ambulatory Visit: Payer: Self-pay | Admitting: Internal Medicine

## 2024-03-29 DIAGNOSIS — E785 Hyperlipidemia, unspecified: Secondary | ICD-10-CM

## 2024-03-31 ENCOUNTER — Telehealth: Admitting: Physician Assistant

## 2024-03-31 DIAGNOSIS — B9689 Other specified bacterial agents as the cause of diseases classified elsewhere: Secondary | ICD-10-CM | POA: Diagnosis not present

## 2024-03-31 DIAGNOSIS — J028 Acute pharyngitis due to other specified organisms: Secondary | ICD-10-CM | POA: Diagnosis not present

## 2024-03-31 MED ORDER — AMOXICILLIN 500 MG PO CAPS: 500.0000 mg | ORAL_CAPSULE | Freq: Two times a day (BID) | ORAL | 0 refills | Status: AC

## 2024-03-31 NOTE — Progress Notes (Signed)
 We are sorry that you are not feeling well.  Here is how we plan to help!  Based on what you have shared with me it is likely that you have bacterial pharyngitis.  Bacterial pharyngitis is a bacterial infection in the back of the throat causing inflammation, and is treated with antibiotics.  I have prescribed Amoxicillin  500 mg twice a day for 10 days. For throat pain, we recommend over the counter oral pain relief medications such as acetaminophen  or aspirin , or anti-inflammatory medications such as ibuprofen or naproxen sodium. Topical treatments such as oral throat lozenges or sprays may be used as needed. Bacterial pharyngitis infections are not as easily transmitted as other respiratory infections, however we still recommend that you avoid close contact with loved ones, especially the very young and elderly.  Remember to wash your hands thoroughly throughout the day as this is the number one way to prevent the spread of infection! We also recommend that you periodically wipe down door knobs and counters with disinfectant.   Home Care: Only take medications as instructed by your medical team. Complete the entire course of an antibiotic. Do not take these medications with alcohol. A steam or ultrasonic humidifier can help congestion.  You can place a towel over your head and breathe in the steam from hot water coming from a faucet. Avoid close contact with others, especially the very young and the elderly. Cover your mouth when you cough or sneeze. Always remember to wash your hands.  Get Help Right Away If: You develop worsening fever or sinus pain. You develop a severe head ache or visual changes. Your symptoms persist after you have completed your treatment plan.  Make sure you Understand these instructions. Will watch your condition. Will get help right away if you are not doing well or get worse.  Your e-visit answers were reviewed by a board certified advanced clinical practitioner  to complete your personal care plan.  Depending on the condition, your plan could have included both over the counter or prescription medications.  If there is a problem, please reply once you have received a response from your provider.  Your safety is important to us .  If you have drug allergies check your prescription carefully.    You can use MyChart to ask questions about today's visit, request a non-urgent call back, or ask for a work or school excuse for 24 hours related to this e-Visit. If it has been greater than 24 hours you will need to follow up with your provider, or enter a new e-Visit to address those concerns.  You will get an e-mail in the next two days asking about your experience.  I hope that your e-visit has been valuable and will speed your recovery. Thank you for using e-visits.   I have spent 5 minutes in review of e-visit questionnaire, review and updating patient chart, medical decision making and response to patient.   Delon CHRISTELLA Dickinson, PA-C

## 2024-04-12 ENCOUNTER — Ambulatory Visit: Admitting: Internal Medicine

## 2024-04-12 ENCOUNTER — Encounter: Payer: Self-pay | Admitting: Internal Medicine

## 2024-04-13 ENCOUNTER — Other Ambulatory Visit: Payer: Self-pay | Admitting: Internal Medicine

## 2024-04-13 DIAGNOSIS — M19071 Primary osteoarthritis, right ankle and foot: Secondary | ICD-10-CM

## 2024-04-13 DIAGNOSIS — M4802 Spinal stenosis, cervical region: Secondary | ICD-10-CM

## 2024-04-18 ENCOUNTER — Ambulatory Visit (INDEPENDENT_AMBULATORY_CARE_PROVIDER_SITE_OTHER): Admitting: Otolaryngology

## 2024-04-18 ENCOUNTER — Encounter (INDEPENDENT_AMBULATORY_CARE_PROVIDER_SITE_OTHER): Payer: Self-pay | Admitting: Otolaryngology

## 2024-04-18 VITALS — BP 124/78 | HR 102 | Temp 97.8°F | Wt 192.0 lb

## 2024-04-18 DIAGNOSIS — J324 Chronic pansinusitis: Secondary | ICD-10-CM | POA: Diagnosis not present

## 2024-04-18 DIAGNOSIS — J338 Other polyp of sinus: Secondary | ICD-10-CM

## 2024-04-18 NOTE — Progress Notes (Signed)
 Patient ID: Jeff Norton, male   DOB: 04-Oct-1965, 58 y.o.   MRN: [id number]  Procedure: Right nasal/sinus debridement status post endoscopic sinus surgery.  Indication: The patient previously underwent right endoscopic sinus surgery to treat his chronic right sided sinusitis and polyposis.  Intraoperatively, a large amount of necrotic granulation tissue was noted within his nasal cavities.  The pathology report showed only necrotic tissues and chronic infections.  No obvious granuloma or other pathology was noted.  The patient returns today complaining of persistent nasal congestion and crusting.  He had no significant bleeding from the nasal cavities.  Currently the patient denies any facial pain, fever, or visual change.  Anesthesia: Topical Xylocaine and Neo-Synephrine.  Description: The patient is placed upright in the exam chair.  The right nasal cavity is sprayed with topical Xylocaine and Neo-Synephrine.  A 0 rigid endoscope is used for the examination. The scope is first advanced past the right nostril into the right nasal cavity. A large amount of crusting is again noted within the right nasal cavity and the maxillary/ethmoid cavities.  The crusting is partially removed with suction catheters and alligator forceps, which are inserted in parallel with the rigid endoscope.  The maxillary and ethmoid cavities and the frontal recess are all obstructed by the crusted debris.  A large septal perforation is again noted.  Follow up care: The patient is instructed to perform medicated (mupirocin/budesonide/amphotericin B) nasal saline irrigation twice daily.  The patient will return for re-evaluation in approximately 3 weeks.  In light of his unusual pathology and persistent crusting, I would like to refer the patient to a rhinologist for second opinion and further treatment.

## 2024-04-21 ENCOUNTER — Ambulatory Visit: Admitting: Internal Medicine

## 2024-04-21 ENCOUNTER — Encounter: Payer: Self-pay | Admitting: Internal Medicine

## 2024-04-21 ENCOUNTER — Ambulatory Visit: Payer: Self-pay | Admitting: Internal Medicine

## 2024-04-21 VITALS — BP 130/80 | HR 94 | Temp 97.8°F | Ht 69.0 in | Wt 187.8 lb

## 2024-04-21 DIAGNOSIS — J4489 Other specified chronic obstructive pulmonary disease: Secondary | ICD-10-CM

## 2024-04-21 DIAGNOSIS — Z23 Encounter for immunization: Secondary | ICD-10-CM | POA: Diagnosis not present

## 2024-04-21 DIAGNOSIS — E785 Hyperlipidemia, unspecified: Secondary | ICD-10-CM

## 2024-04-21 DIAGNOSIS — E118 Type 2 diabetes mellitus with unspecified complications: Secondary | ICD-10-CM | POA: Diagnosis not present

## 2024-04-21 DIAGNOSIS — R634 Abnormal weight loss: Secondary | ICD-10-CM | POA: Diagnosis not present

## 2024-04-21 DIAGNOSIS — M5030 Other cervical disc degeneration, unspecified cervical region: Secondary | ICD-10-CM

## 2024-04-21 DIAGNOSIS — R748 Abnormal levels of other serum enzymes: Secondary | ICD-10-CM

## 2024-04-21 LAB — CBC WITH DIFFERENTIAL/PLATELET
Basophils Absolute: 0.1 K/uL (ref 0.0–0.1)
Basophils Relative: 1.2 % (ref 0.0–3.0)
Eosinophils Absolute: 0.6 K/uL (ref 0.0–0.7)
Eosinophils Relative: 6.6 % — ABNORMAL HIGH (ref 0.0–5.0)
HCT: 38.8 % — ABNORMAL LOW (ref 39.0–52.0)
Hemoglobin: 13 g/dL (ref 13.0–17.0)
Lymphocytes Relative: 22.6 % (ref 12.0–46.0)
Lymphs Abs: 1.9 K/uL (ref 0.7–4.0)
MCHC: 33.5 g/dL (ref 30.0–36.0)
MCV: 85.2 fl (ref 78.0–100.0)
Monocytes Absolute: 0.9 K/uL (ref 0.1–1.0)
Monocytes Relative: 11 % (ref 3.0–12.0)
Neutro Abs: 4.9 K/uL (ref 1.4–7.7)
Neutrophils Relative %: 58.6 % (ref 43.0–77.0)
Platelets: 207 K/uL (ref 150.0–400.0)
RBC: 4.56 Mil/uL (ref 4.22–5.81)
RDW: 14.8 % (ref 11.5–15.5)
WBC: 8.4 K/uL (ref 4.0–10.5)

## 2024-04-21 LAB — LIPID PANEL
Cholesterol: 120 mg/dL (ref 0–200)
HDL: 53.4 mg/dL (ref >39.00)
LDL Cholesterol: 53 mg/dL (ref 0–99)
NonHDL: 67.03
Total CHOL/HDL Ratio: 2
Triglycerides: 69 mg/dL (ref 0.0–149.0)
VLDL: 13.8 mg/dL (ref 0.0–40.0)

## 2024-04-21 LAB — BASIC METABOLIC PANEL WITH GFR
BUN: 25 mg/dL — ABNORMAL HIGH (ref 6–23)
CO2: 30 meq/L (ref 19–32)
Calcium: 9.8 mg/dL (ref 8.4–10.5)
Chloride: 98 meq/L (ref 96–112)
Creatinine, Ser: 0.72 mg/dL (ref 0.40–1.50)
GFR: 99.91 mL/min (ref >60.00)
Glucose, Bld: 156 mg/dL — ABNORMAL HIGH (ref 70–99)
Potassium: 4 meq/L (ref 3.5–5.1)
Sodium: 136 meq/L (ref 135–145)

## 2024-04-21 LAB — HEPATIC FUNCTION PANEL
ALT: 20 U/L (ref 0–53)
AST: 21 U/L (ref 0–37)
Albumin: 4.5 g/dL (ref 3.5–5.2)
Alkaline Phosphatase: 70 U/L (ref 39–117)
Bilirubin, Direct: 0.1 mg/dL (ref 0.0–0.3)
Total Bilirubin: 0.5 mg/dL (ref 0.2–1.2)
Total Protein: 7.7 g/dL (ref 6.0–8.3)

## 2024-04-21 LAB — LIPASE: Lipase: 93 U/L — ABNORMAL HIGH (ref 11.0–59.0)

## 2024-04-21 LAB — AMYLASE: Amylase: 46 U/L (ref 27–131)

## 2024-04-21 LAB — HEMOGLOBIN A1C: Hgb A1c MFr Bld: 6.3 % (ref 4.6–6.5)

## 2024-04-21 NOTE — Progress Notes (Addendum)
 Subjective:  Patient ID: Jeff Norton, male    DOB: 1965-06-27  Age: 58 y.o. MRN: [id number]  CC: Diabetes, Hyperlipidemia, and COPD   HPI Jeff Norton presents for f/up --  Discussed the use of AI scribe software for clinical note transcription with the patient, who gave verbal consent to proceed.  History of Present Illness Jeff Norton is a 58 year old male who presents with persistent right sinus blockage and weight loss.  He has persistent right sinus blockage in the secondary sinus despite a procedure four weeks ago. Initial concern for Wegener's granulomatosis was ruled out by pathology. Correction of a deviated septum provided only intermittent relief, with showers offering temporary improvement.  He has experienced significant weight loss, from 220 pounds a year ago to 187 pounds currently. He attributes some of this to a previous episode of pancreatitis, during which he consumed mainly protein shakes and minimal solid food. He occasionally experiences mild abdominal discomfort and reports hard, pebbly stools, with a recent episode of softer stools after eating a [employer] burger. No blood or mucus in his stool.  He mentions occasional wheezing but no chest pain or shortness of breath. He experiences hip pain when bending down, which he associates with his active job involving lifting and stretching. He drinks bourbon, consuming about two to three ounces four nights a week, and is concerned about its impact on his pancreas. A CT scan of the pancreas showed inflammation consistent with pancreatitis but no signs of cancer.  He has a history of neck issues, having undergone two fusions, and experiences pain in his neck and shoulder, which he attributes to referred pain. No radiating pain, numbness, weakness, or tingling in his arms.  He reports cognitive changes, such as occasionally losing track in conversations and difficulty recalling names. He also notes discoloration in  his feet.     Outpatient Medications Prior to Visit  Medication Sig Dispense Refill   Albuterol  Sulfate (PROAIR  RESPICLICK) 108 (90 Base) MCG/ACT AEPB INHALE 1 PUFF INTO THE LUNGS THREE TIMES DAILY AS NEEDED 1 each 5   Ascorbic Acid (VITAMIN C) 1000 MG tablet Take 1,000 mg by mouth daily.     cholecalciferol (VITAMIN D) 25 MCG (1000 UNIT) tablet SMARTSIG:1 Tablet(s) By Mouth     cyclobenzaprine  (FLEXERIL ) 10 MG tablet Take 1 tablet (10 mg total) by mouth 3 (three) times daily as needed for muscle spasms. 300 tablet 0   diclofenac  (VOLTAREN ) 75 MG EC tablet TAKE 1 TABLET BY MOUTH TWICE  DAILY 200 tablet 2   Fluticasone -Umeclidin-Vilant (TRELEGY ELLIPTA ) 100-62.5-25 MCG/ACT AEPB USE 1 INHALATION BY MOUTH ONCE  DAILY AT THE SAME TIME EACH DAY 180 each 1   lamoTRIgine  (LAMICTAL ) 200 MG tablet TAKE 1 TABLET BY MOUTH TWICE  DAILY 180 tablet 0   LORazepam  (ATIVAN ) 1 MG tablet TAKE 1 TABLET(1 MG) BY MOUTH TWICE DAILY 60 tablet 0   Lurasidone HCl 120 MG TABS Take 120 mg by mouth daily with breakfast.     MAGNESIUM-ZINC PO Take by mouth.     montelukast  (SINGULAIR ) 10 MG tablet Take 1 tablet (10 mg total) by mouth at bedtime. NEEDS APPOINTMENT FOR REFILLS 30 tablet 0   Multiple Vitamin (MULTIVITAMIN) tablet Take 1 tablet by mouth daily.     Multiple Vitamins-Minerals (VITAMIN D3 COMPLETE PO) Take by mouth.     rosuvastatin  (CRESTOR ) 10 MG tablet TAKE 1 TABLET BY MOUTH DAILY 30 tablet 11   sertraline (ZOLOFT) 50 MG tablet Take  50 mg by mouth daily.     tamsulosin  (FLOMAX ) 0.4 MG CAPS capsule Take 2 capsules (0.8 mg total) by mouth daily. 200 capsule 0   ZYRTEC ALLERGY 10 MG tablet SMARTSIG:1 By Mouth     No facility-administered medications prior to visit.    ROS Review of Systems  Constitutional:  Positive for unexpected weight change. Negative for appetite change, chills, diaphoresis, fatigue and fever.  HENT:  Positive for congestion, postnasal drip and rhinorrhea. Negative for sinus pressure,  sinus pain, sneezing, sore throat and trouble swallowing.   Eyes: Negative.  Negative for visual disturbance.  Respiratory:  Positive for cough and wheezing. Negative for chest tightness, shortness of breath and stridor.   Cardiovascular:  Negative for chest pain, palpitations and leg swelling.  Gastrointestinal:  Positive for abdominal pain. Negative for constipation, diarrhea, nausea and vomiting.  Endocrine: Negative.   Genitourinary: Negative.  Negative for difficulty urinating and dysuria.  Musculoskeletal: Negative.   Skin: Negative.   Neurological: Negative.  Negative for dizziness and weakness.  Hematological:  Negative for adenopathy. Does not bruise/bleed easily.  Psychiatric/Behavioral:  Positive for dysphoric mood. Negative for confusion, decreased concentration, sleep disturbance and suicidal ideas. The patient is nervous/anxious.     Objective:  BP 130/80 (BP Location: Left Arm, Patient Position: Sitting, Cuff Size: Normal)   Pulse 94   Temp 97.8 F (36.6 C) (Oral)   Ht 5' 9 (1.753 m)   Wt 187 lb 12.8 oz (85.2 kg)   SpO2 99%   BMI 27.73 kg/m   BP Readings from Last 3 Encounters:  04/21/24 130/80  04/18/24 124/78  03/25/24 114/74    Wt Readings from Last 3 Encounters:  04/21/24 187 lb 12.8 oz (85.2 kg)  04/18/24 192 lb (87.1 kg)  03/25/24 177 lb (80.3 kg)    Physical Exam Vitals reviewed.  Constitutional:      General: He is not in acute distress.    Appearance: He is ill-appearing. He is not toxic-appearing or diaphoretic.  HENT:     Mouth/Throat:     Mouth: Mucous membranes are moist.  Eyes:     General: No scleral icterus.    Conjunctiva/sclera: Conjunctivae normal.  Cardiovascular:     Rate and Rhythm: Normal rate and regular rhythm.     Heart sounds: No murmur heard.    No friction rub. No gallop.  Pulmonary:     Effort: Pulmonary effort is normal.     Breath sounds: Examination of the right-middle field reveals wheezing and rhonchi.  Examination of the left-middle field reveals wheezing and rhonchi. Examination of the right-lower field reveals wheezing and rhonchi. Examination of the left-lower field reveals wheezing and rhonchi. Wheezing and rhonchi present. No decreased breath sounds or rales.  Abdominal:     General: Abdomen is protuberant. Bowel sounds are normal. There is no distension.     Palpations: Abdomen is soft. There is no hepatomegaly, splenomegaly or mass.     Tenderness: There is no abdominal tenderness. There is no guarding or rebound.  Musculoskeletal:        General: Normal range of motion.     Cervical back: Neck supple.     Right lower leg: No edema.     Left lower leg: No edema.  Lymphadenopathy:     Cervical: No cervical adenopathy.  Skin:    General: Skin is warm and dry.  Neurological:     Mental Status: He is alert. Mental status is at baseline.  Psychiatric:  Attention and Perception: He is inattentive.        Mood and Affect: Mood is anxious. Mood is not depressed. Affect is not tearful.        Speech: He is communicative. Speech is tangential. Speech is not delayed.        Behavior: Behavior normal. Behavior is cooperative.        Thought Content: Thought content normal. Thought content is not paranoid or delusional. Thought content does not include homicidal or suicidal ideation.        Cognition and Memory: Cognition normal.     Lab Results  Component Value Date   WBC 8.4 04/21/2024   HGB 13.0 04/21/2024   HCT 38.8 (L) 04/21/2024   PLT 207.0 04/21/2024   GLUCOSE 156 (H) 04/21/2024   CHOL 120 04/21/2024   TRIG 69.0 04/21/2024   HDL 53.40 04/21/2024   LDLDIRECT 73.0 03/10/2022   LDLCALC 53 04/21/2024   ALT 20 04/21/2024   AST 21 04/21/2024   NA 136 04/21/2024   K 4.0 04/21/2024   CL 98 04/21/2024   CREATININE 0.72 04/21/2024   BUN 25 (H) 04/21/2024   CO2 30 04/21/2024   TSH 2.17 10/22/2023   PSA 1.93 10/22/2023   INR 1.0 01/11/2020   HGBA1C 6.3 04/21/2024    MICROALBUR 4.3 (H) 10/22/2023         CT SINUS WO CONTRAST Result Date: 01/27/2024 EXAM: CT Sinus without contrast 01/27/2024 02:58:40 PM TECHNIQUE: CT sinuses without contrast. Dose reduction techniques were achieved by using automated exposure control and/or adjustment of mA and/or kV according to patient size and/or use of iterative reconstruction technique. COMPARISON: CT of the head dated 03/28/2011. CLINICAL HISTORY: Chronic sinusitis. Reason For Exam; Chronic sinusitis, Chronic pansinusitis; Fusion protocol FINDINGS: FRONTAL: There is circumferential mucosal disease within the floor of the right frontal sinus. The right frontoethmoidal recesses are occluded. The left frontal ethmoidal recess is patent. MAXILLARY: There is circumferential mucosal disease within the right maxillary sinus. The right ostiomeatal complex is occluded. The left ostiomeatal complex is patent. ETHMOID: There is moderate opacification of the right ethmoid air cells. The ethmoid roofs are symmetric. No pneumatization superior to the anterior ethmoid notches. There is an inward bowing deformity of the right lamina papyracea from old orbital blowout fracture. SPHENOID: There is mild mucosal disease within the sphenoid sinus anteriorly with occlusion of the sphenoethmoidal recess. NASAL CAVITY: There is a defect within the cartilaginous nasal septum. OTHER: The mastoid air cells and middle ear cavities are well aerated. No acute soft tissue abnormality. IMPRESSION: 1. Chronic pansinusitis with occlusion of the right ostiomeatal complex and right frontoethmoidal recess. The left ostiomeatal complex and left frontal ethmoidal recess are patent. 2. Defect within the cartilaginous nasal septum. 3. Inward bowing deformity of the right lamina papyracea from old orbital blowout fracture. Electronically signed by: evalene coho 01/27/2024 05:15 PM EDT RP Workstation: [id number]    Assessment & Plan:   Type II diabetes mellitus  with manifestations (HCC)- Blood sugar is well controlled. -     Basic metabolic panel with GFR; Future -     Hepatitis B surface antibody,quantitative; Future -     Hemoglobin A1c; Future  Hyperlipidemia LDL goal <100 -     Lipid panel; Future -     Hepatic function panel; Future  COPD with asthma (HCC)- Stable. -     CBC with Differential/Platelet; Future  Weight loss, non-intentional -     Lipase; Future -  Amylase; Future -     MR ABDOMEN W WO CONTRAST; Future  Need for immunization against influenza -     Flu vaccine trivalent PF, 6mos and older(Flulaval,Afluria,Fluarix,Fluzone)  Elevated lipase- Will evaluate for pancreatitis/pancreatic cancer. -     MR ABDOMEN W WO CONTRAST; Future     Follow-up: Return in about 3 months (around 07/22/2024).  Debby Molt, MD

## 2024-04-21 NOTE — Patient Instructions (Signed)

## 2024-04-22 LAB — HEPATITIS B SURFACE ANTIBODY, QUANTITATIVE: Hep B S AB Quant (Post): <5 m[IU]/mL — ABNORMAL LOW (ref >=10)

## 2024-04-25 ENCOUNTER — Encounter: Payer: Self-pay | Admitting: Internal Medicine

## 2024-04-25 DIAGNOSIS — Z23 Encounter for immunization: Secondary | ICD-10-CM | POA: Insufficient documentation

## 2024-04-25 DIAGNOSIS — R748 Abnormal levels of other serum enzymes: Secondary | ICD-10-CM | POA: Insufficient documentation

## 2024-04-25 DIAGNOSIS — R634 Abnormal weight loss: Secondary | ICD-10-CM | POA: Insufficient documentation

## 2024-04-25 DIAGNOSIS — M5030 Other cervical disc degeneration, unspecified cervical region: Secondary | ICD-10-CM | POA: Insufficient documentation

## 2024-04-25 MED ORDER — TIZANIDINE HCL 2 MG PO TABS: 2.0000 mg | ORAL_TABLET | Freq: Three times a day (TID) | ORAL | 0 refills | Status: DC | PRN

## 2024-04-25 NOTE — Addendum Note (Signed)
 Addended by: JOSHUA DEBBY CROME on: 04/25/2024 07:49 AM   Modules accepted: Orders

## 2024-05-03 ENCOUNTER — Ambulatory Visit
Admission: RE | Admit: 2024-05-03 | Discharge: 2024-05-03 | Disposition: A | Discharge status: Home / Self Care | Source: Ambulatory Visit | Attending: Internal Medicine | Admitting: Internal Medicine

## 2024-05-03 ENCOUNTER — Encounter: Payer: Self-pay | Admitting: Internal Medicine

## 2024-05-03 DIAGNOSIS — R634 Abnormal weight loss: Secondary | ICD-10-CM

## 2024-05-03 DIAGNOSIS — R748 Abnormal levels of other serum enzymes: Secondary | ICD-10-CM

## 2024-05-06 ENCOUNTER — Other Ambulatory Visit: Payer: Self-pay | Admitting: Internal Medicine

## 2024-05-06 DIAGNOSIS — K859 Acute pancreatitis without necrosis or infection, unspecified: Secondary | ICD-10-CM

## 2024-05-06 DIAGNOSIS — R748 Abnormal levels of other serum enzymes: Secondary | ICD-10-CM

## 2024-05-07 ENCOUNTER — Other Ambulatory Visit: Payer: Self-pay | Admitting: Internal Medicine

## 2024-05-07 DIAGNOSIS — F411 Generalized anxiety disorder: Secondary | ICD-10-CM

## 2024-05-08 ENCOUNTER — Ambulatory Visit
Admission: RE | Admit: 2024-05-08 | Discharge: 2024-05-08 | Disposition: A | Source: Ambulatory Visit | Attending: Internal Medicine | Admitting: Internal Medicine

## 2024-05-08 DIAGNOSIS — R748 Abnormal levels of other serum enzymes: Secondary | ICD-10-CM

## 2024-05-08 DIAGNOSIS — K859 Acute pancreatitis without necrosis or infection, unspecified: Secondary | ICD-10-CM

## 2024-05-08 MED ORDER — GADOPICLENOL 0.5 MMOL/ML IV SOLN
8.0000 mL | Freq: Once | INTRAVENOUS | Status: AC | PRN
  Administered 2024-05-08: 8 mL via INTRAVENOUS

## 2024-05-09 ENCOUNTER — Ambulatory Visit: Payer: Self-pay | Admitting: Internal Medicine

## 2024-05-10 ENCOUNTER — Ambulatory Visit (INDEPENDENT_AMBULATORY_CARE_PROVIDER_SITE_OTHER): Admitting: Otolaryngology

## 2024-05-16 ENCOUNTER — Ambulatory Visit: Admitting: Physical Therapy

## 2024-05-16 ENCOUNTER — Ambulatory Visit (INDEPENDENT_AMBULATORY_CARE_PROVIDER_SITE_OTHER): Admitting: Otolaryngology

## 2024-05-18 ENCOUNTER — Other Ambulatory Visit: Payer: Self-pay | Admitting: Internal Medicine

## 2024-05-18 DIAGNOSIS — M5030 Other cervical disc degeneration, unspecified cervical region: Secondary | ICD-10-CM

## 2024-05-18 DIAGNOSIS — N401 Enlarged prostate with lower urinary tract symptoms: Secondary | ICD-10-CM

## 2024-05-20 ENCOUNTER — Other Ambulatory Visit: Payer: Self-pay | Admitting: Internal Medicine

## 2024-05-20 ENCOUNTER — Encounter: Payer: Self-pay | Admitting: Internal Medicine

## 2024-05-20 DIAGNOSIS — J4520 Mild intermittent asthma, uncomplicated: Secondary | ICD-10-CM

## 2024-05-20 DIAGNOSIS — J301 Allergic rhinitis due to pollen: Secondary | ICD-10-CM

## 2024-05-20 MED ORDER — MONTELUKAST SODIUM 10 MG PO TABS: 10.0000 mg | ORAL_TABLET | Freq: Every day | ORAL | 0 refills | Status: AC

## 2024-05-26 ENCOUNTER — Encounter: Payer: Self-pay | Admitting: Internal Medicine

## 2024-06-01 NOTE — Therapy (Signed)
 OUTPATIENT PHYSICAL THERAPY CERVICAL EVALUATION   Patient Name: Jeff Norton MRN: [id number] DOB:31-Dec-1965, 58 y.o., male Today's Date: 06/01/2024  END OF SESSION:   Past Medical History:  Diagnosis Date   Allergy    Anxiety    Asthma    Bipolar 1 disorder (HCC)    Depression    GERD (gastroesophageal reflux disease)    Past Surgical History:  Procedure Laterality Date   CHOLECYSTECTOMY     CYST REMOVAL HAND  06/17/1995   FRACTURE SURGERY     GALLBLADDER SURGERY     Septoplasty, turbinate reduction Bilateral 03/22/2024   Sinus surgery with fusion  03/22/2024   SPINE SURGERY     Patient Active Problem List   Diagnosis Date Noted   Weight loss, non-intentional 04/25/2024   Need for immunization against influenza 04/25/2024   Elevated lipase 04/25/2024   Degenerative disc disease, cervical 04/25/2024   Chronic maxillary sinusitis 03/26/2024   Chronic frontal sinusitis 03/26/2024   Chronic ethmoidal sinusitis 03/26/2024   Bipolar 1 disorder, depressed (HCC) 01/13/2024   Agatston CAC score 200-399 03/24/2023   Atherosclerosis of aorta 02/24/2023   Need for vaccination 02/24/2023   Primary hypertension 02/24/2023   Long term current use of opiate analgesic 03/26/2021   Radiculitis of right cervical region 03/25/2021   Encounter for general adult medical examination with abnormal findings 03/04/2021   Continuous tobacco abuse 12/20/2019   GERD with apnea 10/19/2019   Abnormal electrocardiogram (ECG) (EKG) 12/02/2018   GAD (generalized anxiety disorder) 09/29/2018   Benign prostatic hyperplasia with weak urinary stream 02/25/2018   Spinal stenosis in cervical region 09/18/2016   Sleep apnea with hypersomnolence 08/19/2016   Bile salt-induced diarrhea 01/02/2016   Chronic pansinusitis 01/01/2016   COPD with asthma (HCC) 11/07/2015   Type II diabetes mellitus with manifestations (HCC) 11/07/2015   Obesity (BMI 30.0-34.9) 02/24/2012   Smokers' cough (HCC)  02/24/2012   Osteoarthritis of ankle and foot 10/08/2007   Hyperlipidemia LDL goal <100 04/16/2007   Allergic rhinitis due to pollen 04/16/2007   Panlobular emphysema (HCC) 04/16/2007   Asthma, mild intermittent, poorly controlled 04/16/2007   OSA on CPAP 04/16/2007    PCP: Joshua Debby CROME, MD    REFERRING PROVIDER: Joshua Debby CROME, MD   REFERRING DIAG: M50.30 (ICD-10-CM) - Degenerative disc disease, cervical   THERAPY DIAG:  No diagnosis found.  Rationale for Evaluation and Treatment: Rehabilitation  ONSET DATE: ***  SUBJECTIVE:  SUBJECTIVE STATEMENT: *** Hand dominance: {MISC; OT HAND DOMINANCE:[id number]}  PERTINENT HISTORY:  ***  PAIN:  Are you having pain? Yes: NPRS scale: *** Pain location: *** Pain description: *** Aggravating factors: *** Relieving factors: ***  PRECAUTIONS: {Therapy precautions:24002}  RED FLAGS: {PT Red Flags:29287}     WEIGHT BEARING RESTRICTIONS: {Yes ***/No:24003}  FALLS:  Has patient fallen in last 6 months? {fallsyesno:27318}  LIVING ENVIRONMENT: Lives with: {[facility] lives with:25569::lives with their family} Lives in: {Lives in:25570} Stairs: {opstairs:27293} Has following equipment at home: {Assistive devices:23999}  OCCUPATION: ***  PLOF: {PLOF:24004}  PATIENT GOALS: ***  NEXT MD VISIT: ***  OBJECTIVE:  Note: Objective measures were completed at Evaluation unless otherwise noted.  DIAGNOSTIC FINDINGS:  No imaging for the C spine  PATIENT SURVEYS:  PSFS: THE PATIENT SPECIFIC FUNCTIONAL SCALE  Place score of 0-10 (0 = unable to perform activity and 10 = able to perform activity at the same level as before injury or problem)  Activity Date: 06/03/24         2.     3.     4.      Total Score ***      Total  Score = Sum of activity scores/number of activities  Minimally Detectable Change: 3 points (for single activity); 2 points (for average score)  [facility] (nd). The Patient Specific Functional Scale . Retrieved from [email or web address]   COGNITION: Overall cognitive status: Within functional limits for tasks assessed  SENSATION: {sensation:27233}  POSTURE: {posture:25561}  PALPATION: ***   CERVICAL ROM:   Active ROM A/PROM (deg) eval  Flexion   Extension   Right lateral flexion   Left lateral flexion   Right rotation   Left rotation    (Blank rows = not tested)  UPPER EXTREMITY ROM:  Active ROM Right eval Left eval  Shoulder flexion    Shoulder extension    Shoulder abduction    Shoulder adduction    Shoulder extension    Shoulder internal rotation    Shoulder external rotation    Elbow flexion    Elbow extension    Wrist flexion    Wrist extension    Wrist ulnar deviation    Wrist radial deviation    Wrist pronation    Wrist supination     (Blank rows = not tested)  UPPER EXTREMITY MMT:  MMT Right eval Left eval  Shoulder flexion    Shoulder extension    Shoulder abduction    Shoulder adduction    Shoulder extension    Shoulder internal rotation    Shoulder external rotation    Middle trapezius    Lower trapezius    Elbow flexion    Elbow extension    Wrist flexion    Wrist extension    Wrist ulnar deviation    Wrist radial deviation    Wrist pronation    Wrist supination    Grip strength     (Blank rows = not tested)  CERVICAL SPECIAL TESTS:  Spurling's test: {pos/neg:25243} and Distraction test: {pos/neg:25243}  FUNCTIONAL TESTS:  Not tested  TREATMENT DATE: 06/03/24  Initial evaluation of C spine completed followed by instruction and trial set of HEP.  PATIENT EDUCATION:  Education details: HEP Person educated: Patient Education method: Solicitor, Actor cues, Verbal cues, and Handouts Education comprehension: verbalized understanding and returned demonstration  HOME EXERCISE PROGRAM: ***  ASSESSMENT:  CLINICAL IMPRESSION: Patient is a 58 y.o. male who was seen today for physical therapy evaluation and treatment for cervical pain/restriction. He presents with**** Pt would benefit from skilled PT services to address above deficits and return to previous LOA.  OBJECTIVE IMPAIRMENTS: {opptimpairments:25111}.   ACTIVITY LIMITATIONS: {activitylimitations:27494}  PARTICIPATION LIMITATIONS: {participationrestrictions:25113}  PERSONAL FACTORS: {Personal factors:25162} are also affecting patient's functional outcome.   REHAB POTENTIAL: {rehabpotential:25112}  CLINICAL DECISION MAKING: {clinical decision making:25114}  EVALUATION COMPLEXITY: {Evaluation complexity:25115}   GOALS: Goals reviewed with patient? Yes  SHORT TERM GOALS: Target date: ***  Pt to be independent with HEP. Baseline:  Goal status: INITIAL  2.  Decrease pain by 1 level. Baseline:  Goal status: INITIAL  3.  *** Baseline:  Goal status: INITIAL   LONG TERM GOALS: Target date: ***  Decrease max pain to 2/10 with all activities. Baseline:  Goal status: INITIAL  2.  Pt to be independent with a self progressive HEP at discharge. Baseline:  Goal status: INITIAL  3.  Increase AROM by *** Baseline:  Goal status: INITIAL  4.  Increase flexibility*** Baseline:  Goal status: INITIAL  5.  *** Baseline:  Goal status: INITIAL  6.  *** Baseline:  Goal status: INITIAL   PLAN:  PT FREQUENCY: {rehab frequency:25116}  PT DURATION: {rehab duration:25117}  PLANNED INTERVENTIONS: 97164- PT Re-evaluation, 97110-Therapeutic exercises, 97530- Therapeutic activity, 97112- Neuromuscular re-education, 97535- Self Care,  97140- Manual therapy, [id number]- Electrical stimulation (unattended), 20560 (1-2 muscles), 20561 (3+ muscles)- Dry Needling, Patient/Family education, Joint mobilization, Spinal mobilization, Cryotherapy, and Moist heat  PLAN FOR NEXT SESSION: ***   Burnard CHRISTELLA Meth, PT 06/01/2024, 2:47 PM

## 2024-06-03 ENCOUNTER — Other Ambulatory Visit: Payer: Self-pay

## 2024-06-03 ENCOUNTER — Ambulatory Visit

## 2024-06-03 DIAGNOSIS — M542 Cervicalgia: Secondary | ICD-10-CM

## 2024-06-03 DIAGNOSIS — R293 Abnormal posture: Secondary | ICD-10-CM | POA: Diagnosis not present

## 2024-07-15 ENCOUNTER — Encounter: Payer: Self-pay | Admitting: Internal Medicine
